# Patient Record
Sex: Female | Born: 1935 | Race: Black or African American | Hispanic: No | Smoking: Former smoker
Health system: Southern US, Community
[De-identification: ages and names within clinical notes are randomized; demographics above are authoritative.]

## PROBLEM LIST (undated history)

## (undated) DIAGNOSIS — K219 Gastro-esophageal reflux disease without esophagitis: Principal | ICD-10-CM

## (undated) DIAGNOSIS — G4700 Insomnia, unspecified: Secondary | ICD-10-CM

## (undated) DIAGNOSIS — H269 Unspecified cataract: Secondary | ICD-10-CM

## (undated) DIAGNOSIS — E039 Hypothyroidism, unspecified: Secondary | ICD-10-CM

## (undated) DIAGNOSIS — I7090 Unspecified atherosclerosis: Secondary | ICD-10-CM

## (undated) DIAGNOSIS — K226 Gastro-esophageal laceration-hemorrhage syndrome: Secondary | ICD-10-CM

## (undated) DIAGNOSIS — E785 Hyperlipidemia, unspecified: Secondary | ICD-10-CM

## (undated) DIAGNOSIS — I5189 Other ill-defined heart diseases: Secondary | ICD-10-CM

## (undated) DIAGNOSIS — E876 Hypokalemia: Secondary | ICD-10-CM

## (undated) DIAGNOSIS — F0391 Unspecified dementia with behavioral disturbance: Secondary | ICD-10-CM

## (undated) DIAGNOSIS — R531 Weakness: Secondary | ICD-10-CM

## (undated) DIAGNOSIS — J45909 Unspecified asthma, uncomplicated: Secondary | ICD-10-CM

## (undated) DIAGNOSIS — I350 Nonrheumatic aortic (valve) stenosis: Secondary | ICD-10-CM

## (undated) DIAGNOSIS — R269 Unspecified abnormalities of gait and mobility: Secondary | ICD-10-CM

## (undated) DIAGNOSIS — Z5189 Encounter for other specified aftercare: Secondary | ICD-10-CM

## (undated) DIAGNOSIS — IMO0001 Reserved for inherently not codable concepts without codable children: Secondary | ICD-10-CM

## (undated) DIAGNOSIS — F03918 Unspecified dementia, unspecified severity, with other behavioral disturbance: Secondary | ICD-10-CM

## (undated) DIAGNOSIS — J449 Chronic obstructive pulmonary disease, unspecified: Secondary | ICD-10-CM

## (undated) DIAGNOSIS — R32 Unspecified urinary incontinence: Secondary | ICD-10-CM

## (undated) DIAGNOSIS — F1021 Alcohol dependence, in remission: Secondary | ICD-10-CM

## (undated) DIAGNOSIS — R911 Solitary pulmonary nodule: Secondary | ICD-10-CM

## (undated) DIAGNOSIS — I10 Essential (primary) hypertension: Secondary | ICD-10-CM

## (undated) DIAGNOSIS — K449 Diaphragmatic hernia without obstruction or gangrene: Secondary | ICD-10-CM

## (undated) DIAGNOSIS — K5900 Constipation, unspecified: Secondary | ICD-10-CM

## (undated) DIAGNOSIS — D649 Anemia, unspecified: Secondary | ICD-10-CM

## (undated) HISTORY — DX: Nonrheumatic aortic (valve) stenosis: I35.0

## (undated) HISTORY — DX: Unspecified asthma, uncomplicated: J45.909

## (undated) HISTORY — DX: Unspecified atherosclerosis: I70.90

## (undated) HISTORY — DX: Other ill-defined heart diseases: I51.89

## (undated) HISTORY — DX: Gastro-esophageal laceration-hemorrhage syndrome: K22.6

## (undated) HISTORY — DX: Unspecified cataract: H26.9

## (undated) HISTORY — DX: Constipation, unspecified: K59.00

## (undated) HISTORY — DX: Solitary pulmonary nodule: R91.1

## (undated) HISTORY — DX: Hyperlipidemia, unspecified: E78.5

## (undated) HISTORY — PX: NO PAST SURGERIES: SHX2092

## (undated) HISTORY — DX: Diaphragmatic hernia without obstruction or gangrene: K44.9

## (undated) HISTORY — DX: Weakness: R53.1

## (undated) HISTORY — DX: Unspecified abnormalities of gait and mobility: R26.9

## (undated) HISTORY — DX: Unspecified urinary incontinence: R32

## (undated) HISTORY — DX: Insomnia, unspecified: G47.00

## (undated) HISTORY — DX: Chronic obstructive pulmonary disease, unspecified: J44.9

## (undated) HISTORY — DX: Hypothyroidism, unspecified: E03.9

---

## 1997-06-29 ENCOUNTER — Inpatient Hospital Stay (HOSPITAL_COMMUNITY): Admission: AD | Admit: 1997-06-29 | Discharge: 1997-06-30 | Payer: Self-pay | Admitting: *Deleted

## 1997-07-15 ENCOUNTER — Inpatient Hospital Stay (HOSPITAL_COMMUNITY): Admission: EM | Admit: 1997-07-15 | Discharge: 1997-07-17 | Payer: Self-pay | Admitting: Emergency Medicine

## 1998-01-04 ENCOUNTER — Inpatient Hospital Stay (HOSPITAL_COMMUNITY): Admission: EM | Admit: 1998-01-04 | Discharge: 1998-01-07 | Payer: Self-pay | Admitting: Critical Care Medicine

## 1998-03-01 ENCOUNTER — Encounter: Payer: Self-pay | Admitting: Emergency Medicine

## 1998-03-02 ENCOUNTER — Inpatient Hospital Stay (HOSPITAL_COMMUNITY): Admission: EM | Admit: 1998-03-02 | Discharge: 1998-03-04 | Payer: Self-pay | Admitting: Emergency Medicine

## 1999-02-24 ENCOUNTER — Inpatient Hospital Stay (HOSPITAL_COMMUNITY): Admission: EM | Admit: 1999-02-24 | Discharge: 1999-02-27 | Payer: Self-pay | Admitting: Emergency Medicine

## 1999-02-24 ENCOUNTER — Encounter: Payer: Self-pay | Admitting: Emergency Medicine

## 1999-09-28 ENCOUNTER — Other Ambulatory Visit: Admission: RE | Admit: 1999-09-28 | Discharge: 1999-09-28 | Payer: Self-pay | Admitting: Internal Medicine

## 1999-10-05 ENCOUNTER — Ambulatory Visit (HOSPITAL_COMMUNITY): Admission: RE | Admit: 1999-10-05 | Discharge: 1999-10-05 | Payer: Self-pay | Admitting: Internal Medicine

## 1999-10-05 ENCOUNTER — Encounter: Payer: Self-pay | Admitting: Internal Medicine

## 1999-10-09 ENCOUNTER — Emergency Department (HOSPITAL_COMMUNITY): Admission: EM | Admit: 1999-10-09 | Discharge: 1999-10-09 | Payer: Self-pay | Admitting: Emergency Medicine

## 1999-10-09 ENCOUNTER — Encounter: Payer: Self-pay | Admitting: Emergency Medicine

## 1999-10-14 ENCOUNTER — Ambulatory Visit (HOSPITAL_COMMUNITY): Admission: RE | Admit: 1999-10-14 | Discharge: 1999-10-14 | Payer: Self-pay | Admitting: Pulmonary Disease

## 1999-10-14 ENCOUNTER — Encounter: Payer: Self-pay | Admitting: Pulmonary Disease

## 1999-10-25 ENCOUNTER — Encounter: Payer: Self-pay | Admitting: Emergency Medicine

## 1999-10-25 ENCOUNTER — Inpatient Hospital Stay (HOSPITAL_COMMUNITY): Admission: EM | Admit: 1999-10-25 | Discharge: 1999-10-30 | Payer: Self-pay | Admitting: Emergency Medicine

## 1999-11-02 ENCOUNTER — Encounter: Payer: Self-pay | Admitting: Gastroenterology

## 1999-11-17 ENCOUNTER — Encounter: Payer: Self-pay | Admitting: Gastroenterology

## 1999-11-17 ENCOUNTER — Ambulatory Visit (HOSPITAL_COMMUNITY): Admission: RE | Admit: 1999-11-17 | Discharge: 1999-11-17 | Payer: Self-pay | Admitting: Gastroenterology

## 2001-03-04 ENCOUNTER — Emergency Department (HOSPITAL_COMMUNITY): Admission: EM | Admit: 2001-03-04 | Discharge: 2001-03-04 | Payer: Self-pay | Admitting: Emergency Medicine

## 2001-03-04 ENCOUNTER — Encounter: Payer: Self-pay | Admitting: Emergency Medicine

## 2001-09-11 ENCOUNTER — Other Ambulatory Visit: Admission: RE | Admit: 2001-09-11 | Discharge: 2001-09-11 | Payer: Self-pay | Admitting: Internal Medicine

## 2001-09-19 ENCOUNTER — Encounter: Payer: Self-pay | Admitting: Internal Medicine

## 2001-09-19 ENCOUNTER — Ambulatory Visit (HOSPITAL_COMMUNITY): Admission: RE | Admit: 2001-09-19 | Discharge: 2001-09-19 | Payer: Self-pay | Admitting: Internal Medicine

## 2001-12-11 ENCOUNTER — Emergency Department (HOSPITAL_COMMUNITY): Admission: EM | Admit: 2001-12-11 | Discharge: 2001-12-12 | Payer: Self-pay | Admitting: Emergency Medicine

## 2001-12-12 ENCOUNTER — Encounter: Payer: Self-pay | Admitting: Emergency Medicine

## 2003-01-07 ENCOUNTER — Emergency Department (HOSPITAL_COMMUNITY): Admission: EM | Admit: 2003-01-07 | Discharge: 2003-01-07 | Payer: Self-pay | Admitting: Emergency Medicine

## 2003-01-07 ENCOUNTER — Ambulatory Visit (HOSPITAL_COMMUNITY): Admission: RE | Admit: 2003-01-07 | Discharge: 2003-01-07 | Payer: Self-pay | Admitting: Rheumatology

## 2004-03-27 ENCOUNTER — Ambulatory Visit: Payer: Self-pay | Admitting: Pulmonary Disease

## 2004-05-26 ENCOUNTER — Ambulatory Visit: Payer: Self-pay | Admitting: Pulmonary Disease

## 2004-06-29 ENCOUNTER — Ambulatory Visit: Payer: Self-pay | Admitting: Pulmonary Disease

## 2004-10-09 ENCOUNTER — Ambulatory Visit: Payer: Self-pay | Admitting: Pulmonary Disease

## 2005-06-29 ENCOUNTER — Encounter: Admission: RE | Admit: 2005-06-29 | Discharge: 2005-06-29 | Payer: Self-pay | Admitting: Internal Medicine

## 2006-03-11 ENCOUNTER — Inpatient Hospital Stay (HOSPITAL_COMMUNITY): Admission: EM | Admit: 2006-03-11 | Discharge: 2006-03-13 | Payer: Self-pay | Admitting: Family Medicine

## 2006-09-05 ENCOUNTER — Encounter: Payer: Self-pay | Admitting: Internal Medicine

## 2006-09-05 ENCOUNTER — Inpatient Hospital Stay (HOSPITAL_COMMUNITY): Admission: EM | Admit: 2006-09-05 | Discharge: 2006-09-07 | Payer: Self-pay | Admitting: Emergency Medicine

## 2006-09-05 DIAGNOSIS — K219 Gastro-esophageal reflux disease without esophagitis: Secondary | ICD-10-CM

## 2006-09-05 DIAGNOSIS — K226 Gastro-esophageal laceration-hemorrhage syndrome: Secondary | ICD-10-CM

## 2006-09-05 DIAGNOSIS — K449 Diaphragmatic hernia without obstruction or gangrene: Secondary | ICD-10-CM

## 2006-09-05 HISTORY — DX: Gastro-esophageal laceration-hemorrhage syndrome: K22.6

## 2006-09-05 HISTORY — DX: Diaphragmatic hernia without obstruction or gangrene: K44.9

## 2006-09-07 ENCOUNTER — Ambulatory Visit: Payer: Self-pay | Admitting: Internal Medicine

## 2006-10-03 ENCOUNTER — Ambulatory Visit: Payer: Self-pay | Admitting: Gastroenterology

## 2006-10-03 LAB — CONVERTED CEMR LAB
Basophils Relative: 0.5 % (ref 0.0–1.0)
Eosinophils Absolute: 0.3 10*3/uL (ref 0.0–0.6)
Eosinophils Relative: 3 % (ref 0.0–5.0)
Hemoglobin: 11.3 g/dL — ABNORMAL LOW (ref 12.0–15.0)
Lymphocytes Relative: 32.3 % (ref 12.0–46.0)
MCV: 86.1 fL (ref 78.0–100.0)
Monocytes Absolute: 0.6 10*3/uL (ref 0.2–0.7)
Neutro Abs: 5.5 10*3/uL (ref 1.4–7.7)
WBC: 9.5 10*3/uL (ref 4.5–10.5)

## 2007-08-01 ENCOUNTER — Encounter: Admission: RE | Admit: 2007-08-01 | Discharge: 2007-08-01 | Payer: Self-pay | Admitting: Internal Medicine

## 2007-11-19 ENCOUNTER — Emergency Department (HOSPITAL_COMMUNITY): Admission: EM | Admit: 2007-11-19 | Discharge: 2007-11-19 | Payer: Self-pay | Admitting: Emergency Medicine

## 2008-03-18 ENCOUNTER — Encounter: Admission: RE | Admit: 2008-03-18 | Discharge: 2008-03-18 | Payer: Self-pay | Admitting: Neurology

## 2008-06-14 ENCOUNTER — Inpatient Hospital Stay (HOSPITAL_COMMUNITY): Admission: EM | Admit: 2008-06-14 | Discharge: 2008-06-19 | Payer: Self-pay | Admitting: Emergency Medicine

## 2008-06-17 ENCOUNTER — Ambulatory Visit: Payer: Self-pay | Admitting: Vascular Surgery

## 2008-06-17 ENCOUNTER — Encounter (INDEPENDENT_AMBULATORY_CARE_PROVIDER_SITE_OTHER): Payer: Self-pay | Admitting: Internal Medicine

## 2009-02-09 ENCOUNTER — Emergency Department (HOSPITAL_COMMUNITY): Admission: EM | Admit: 2009-02-09 | Discharge: 2009-02-09 | Payer: Self-pay | Admitting: Emergency Medicine

## 2009-07-13 ENCOUNTER — Emergency Department (HOSPITAL_COMMUNITY): Admission: EM | Admit: 2009-07-13 | Discharge: 2009-07-13 | Payer: Self-pay | Admitting: Emergency Medicine

## 2009-08-27 ENCOUNTER — Observation Stay (HOSPITAL_COMMUNITY): Admission: EM | Admit: 2009-08-27 | Discharge: 2009-08-30 | Payer: Self-pay | Admitting: Emergency Medicine

## 2009-09-14 ENCOUNTER — Emergency Department (HOSPITAL_COMMUNITY): Admission: EM | Admit: 2009-09-14 | Discharge: 2009-09-14 | Payer: Self-pay | Admitting: Emergency Medicine

## 2010-03-02 ENCOUNTER — Emergency Department (HOSPITAL_COMMUNITY)
Admission: EM | Admit: 2010-03-02 | Discharge: 2010-03-02 | Payer: Self-pay | Source: Home / Self Care | Admitting: Emergency Medicine

## 2010-04-02 ENCOUNTER — Emergency Department (HOSPITAL_COMMUNITY)
Admission: EM | Admit: 2010-04-02 | Discharge: 2010-04-02 | Payer: Self-pay | Source: Home / Self Care | Admitting: Emergency Medicine

## 2010-04-02 LAB — BASIC METABOLIC PANEL
CO2: 28 mEq/L (ref 19–32)
Calcium: 9.9 mg/dL (ref 8.4–10.5)
Creatinine, Ser: 1.02 mg/dL (ref 0.4–1.2)
GFR calc Af Amer: >60 mL/min (ref >60)
GFR calc non Af Amer: 52 mL/min — ABNORMAL LOW (ref >60)
Glucose, Bld: 80 mg/dL (ref 70–99)
Sodium: 140 mEq/L (ref 135–145)

## 2010-04-02 LAB — DIFFERENTIAL
Basophils Absolute: 0 10*3/uL (ref 0.0–0.1)
Basophils Relative: 0 % (ref 0–1)
Eosinophils Relative: 2 % (ref 0–5)
Monocytes Absolute: 0.8 10*3/uL (ref 0.1–1.0)
Monocytes Relative: 11 % (ref 3–12)
Neutro Abs: 3.9 10*3/uL (ref 1.7–7.7)

## 2010-04-02 LAB — URINALYSIS, ROUTINE W REFLEX MICROSCOPIC
Hgb urine dipstick: NEGATIVE
Ketones, ur: NEGATIVE mg/dL
Nitrite: NEGATIVE
Protein, ur: NEGATIVE mg/dL
Specific Gravity, Urine: 1.025 (ref 1.005–1.030)
Urine Glucose, Fasting: NEGATIVE mg/dL
Urobilinogen, UA: 1 mg/dL (ref 0.0–1.0)
pH: 5 (ref 5.0–8.0)

## 2010-04-02 LAB — CBC
Hemoglobin: 12.5 g/dL (ref 12.0–15.0)
MCH: 29.5 pg (ref 26.0–34.0)
MCHC: 32.1 g/dL (ref 30.0–36.0)
RDW: 15.5 % (ref 11.5–15.5)

## 2010-04-02 LAB — URINE MICROSCOPIC-ADD ON

## 2010-04-02 LAB — POCT CARDIAC MARKERS
CKMB, poc: <1 ng/mL — ABNORMAL LOW (ref 1.0–8.0)
Myoglobin, poc: 45.2 ng/mL (ref 12–200)

## 2010-04-03 LAB — URINE CULTURE

## 2010-05-18 LAB — POCT I-STAT, CHEM 8
BUN: 14 mg/dL (ref 6–23)
Calcium, Ion: 0.97 mmol/L — ABNORMAL LOW (ref 1.12–1.32)
Creatinine, Ser: 1.3 mg/dL — ABNORMAL HIGH (ref 0.4–1.2)
Glucose, Bld: 66 mg/dL — ABNORMAL LOW (ref 70–99)
Hemoglobin: 14.6 g/dL (ref 12.0–15.0)
TCO2: 25 mmol/L (ref 0–100)

## 2010-05-18 LAB — RAPID URINE DRUG SCREEN, HOSP PERFORMED
Amphetamines: NOT DETECTED
Opiates: NOT DETECTED
Tetrahydrocannabinol: NOT DETECTED

## 2010-05-24 LAB — COMPREHENSIVE METABOLIC PANEL
AST: 15 U/L (ref 0–37)
Albumin: 3.2 g/dL — ABNORMAL LOW (ref 3.5–5.2)
Alkaline Phosphatase: 68 U/L (ref 39–117)
BUN: 7 mg/dL (ref 6–23)
GFR calc Af Amer: >60 mL/min (ref >60)
Potassium: 3.2 mEq/L — ABNORMAL LOW (ref 3.5–5.1)
Total Protein: 7.3 g/dL (ref 6.0–8.3)

## 2010-05-24 LAB — GLUCOSE, CAPILLARY: Glucose-Capillary: 89 mg/dL (ref 70–99)

## 2010-05-24 LAB — URINALYSIS, ROUTINE W REFLEX MICROSCOPIC
Bilirubin Urine: NEGATIVE
Hgb urine dipstick: NEGATIVE
Specific Gravity, Urine: 1.009 (ref 1.005–1.030)
pH: 5.5 (ref 5.0–8.0)

## 2010-05-24 LAB — CBC
MCV: 90.9 fL (ref 78.0–100.0)
Platelets: 301 10*3/uL (ref 150–400)
RDW: 17 % — ABNORMAL HIGH (ref 11.5–15.5)
WBC: 8.2 10*3/uL (ref 4.0–10.5)

## 2010-05-24 LAB — POCT I-STAT, CHEM 8
BUN: 9 mg/dL (ref 6–23)
Calcium, Ion: 1.18 mmol/L (ref 1.12–1.32)
Chloride: 107 mEq/L (ref 96–112)
Creatinine, Ser: 1 mg/dL (ref 0.4–1.2)
Glucose, Bld: 89 mg/dL (ref 70–99)
HCT: 44 % (ref 36.0–46.0)
Hemoglobin: 15 g/dL (ref 12.0–15.0)
Potassium: 3.2 mEq/L — ABNORMAL LOW (ref 3.5–5.1)
Sodium: 145 meq/L (ref 135–145)
TCO2: 28 mmol/L (ref 0–100)

## 2010-05-24 LAB — POCT CARDIAC MARKERS
CKMB, poc: 1 ng/mL (ref 1.0–8.0)
Myoglobin, poc: 69.7 ng/mL (ref 12–200)
Troponin i, poc: <0.05 ng/mL (ref 0.00–0.09)

## 2010-05-24 LAB — DIFFERENTIAL
Basophils Relative: 1 % (ref 0–1)
Lymphocytes Relative: 34 % (ref 12–46)
Monocytes Absolute: 0.5 10*3/uL (ref 0.1–1.0)
Monocytes Relative: 6 % (ref 3–12)
Neutro Abs: 4.8 10*3/uL (ref 1.7–7.7)

## 2010-05-25 LAB — COMPREHENSIVE METABOLIC PANEL
ALT: 11 U/L (ref 0–35)
AST: 17 U/L (ref 0–37)
CO2: 27 mEq/L (ref 19–32)
Chloride: 103 mEq/L (ref 96–112)
Creatinine, Ser: 0.93 mg/dL (ref 0.4–1.2)
GFR calc Af Amer: >60 mL/min (ref >60)
GFR calc non Af Amer: 58 mL/min — ABNORMAL LOW (ref >60)
Glucose, Bld: 79 mg/dL (ref 70–99)
Sodium: 139 mEq/L (ref 135–145)
Total Bilirubin: 1.4 mg/dL — ABNORMAL HIGH (ref 0.3–1.2)

## 2010-05-25 LAB — CBC
HCT: 39 % (ref 36.0–46.0)
Hemoglobin: 13.4 g/dL (ref 12.0–15.0)
MCH: 30.8 pg (ref 26.0–34.0)
MCHC: 33.7 g/dL (ref 30.0–36.0)
Platelets: 337 10*3/uL (ref 150–400)
RBC: 4.34 MIL/uL (ref 3.87–5.11)
RDW: 17.8 % — ABNORMAL HIGH (ref 11.5–15.5)
WBC: 7.5 10*3/uL (ref 4.0–10.5)

## 2010-05-25 LAB — BASIC METABOLIC PANEL
Calcium: 10.1 mg/dL (ref 8.4–10.5)
Calcium: 9.5 mg/dL (ref 8.4–10.5)
Creatinine, Ser: 1.08 mg/dL (ref 0.4–1.2)
GFR calc Af Amer: >60 mL/min (ref >60)
GFR calc non Af Amer: 49 mL/min — ABNORMAL LOW (ref >60)
GFR calc non Af Amer: 58 mL/min — ABNORMAL LOW (ref >60)
Glucose, Bld: 114 mg/dL — ABNORMAL HIGH (ref 70–99)
Glucose, Bld: 78 mg/dL (ref 70–99)
Sodium: 141 mEq/L (ref 135–145)
Sodium: 143 mEq/L (ref 135–145)

## 2010-05-25 LAB — TSH: TSH: 2.797 u[IU]/mL (ref 0.350–4.500)

## 2010-05-25 LAB — D-DIMER, QUANTITATIVE: D-Dimer, Quant: 1.08 ug/mL-FEU — ABNORMAL HIGH (ref 0.00–0.48)

## 2010-05-25 LAB — GLUCOSE, CAPILLARY
Glucose-Capillary: 103 mg/dL — ABNORMAL HIGH (ref 70–99)
Glucose-Capillary: 105 mg/dL — ABNORMAL HIGH (ref 70–99)
Glucose-Capillary: 83 mg/dL (ref 70–99)
Glucose-Capillary: 91 mg/dL (ref 70–99)
Glucose-Capillary: 98 mg/dL (ref 70–99)

## 2010-05-25 LAB — POCT I-STAT 3, ART BLOOD GAS (G3+)
Acid-Base Excess: 1 mmol/L (ref 0.0–2.0)
Bicarbonate: 25.2 mEq/L — ABNORMAL HIGH (ref 20.0–24.0)
pCO2 arterial: 36.9 mmHg (ref 35.0–45.0)
pH, Arterial: 7.441 — ABNORMAL HIGH (ref 7.350–7.400)
pO2, Arterial: 149 mmHg — ABNORMAL HIGH (ref 80.0–100.0)

## 2010-05-25 LAB — URINALYSIS, ROUTINE W REFLEX MICROSCOPIC
Leukocytes, UA: NEGATIVE
Protein, ur: 30 mg/dL — AB
Urobilinogen, UA: 1 mg/dL (ref 0.0–1.0)

## 2010-05-25 LAB — CARDIAC PANEL(CRET KIN+CKTOT+MB+TROPI): CK, MB: 3.7 ng/mL (ref 0.3–4.0)

## 2010-05-25 LAB — DIFFERENTIAL
Basophils Absolute: 0 10*3/uL (ref 0.0–0.1)
Basophils Absolute: 0 10*3/uL (ref 0.0–0.1)
Basophils Relative: 0 % (ref 0–1)
Basophils Relative: 0 % (ref 0–1)
Eosinophils Absolute: 0.1 10*3/uL (ref 0.0–0.7)
Eosinophils Relative: 1 % (ref 0–5)
Lymphocytes Relative: 16 % (ref 12–46)
Neutro Abs: 5.8 10*3/uL (ref 1.7–7.7)
Neutrophils Relative %: 77 % (ref 43–77)

## 2010-05-25 LAB — T4, FREE: Free T4: 1.12 ng/dL (ref 0.80–1.80)

## 2010-05-25 LAB — URINE MICROSCOPIC-ADD ON

## 2010-05-25 LAB — POCT CARDIAC MARKERS
CKMB, poc: 3.1 ng/mL (ref 1.0–8.0)
Troponin i, poc: <0.05 ng/mL (ref 0.00–0.09)
Troponin i, poc: <0.05 ng/mL (ref 0.00–0.09)

## 2010-05-25 LAB — CK TOTAL AND CKMB (NOT AT ARMC)
Relative Index: 2.6 — ABNORMAL HIGH (ref 0.0–2.5)
Total CK: 136 U/L (ref 7–177)

## 2010-05-25 LAB — SEDIMENTATION RATE: Sed Rate: 15 mm/hr (ref 0–22)

## 2010-05-26 LAB — URINE CULTURE: Colony Count: 50000

## 2010-05-26 LAB — COMPREHENSIVE METABOLIC PANEL
ALT: 16 U/L (ref 0–35)
AST: 22 U/L (ref 0–37)
Albumin: 3.5 g/dL (ref 3.5–5.2)
Alkaline Phosphatase: 67 U/L (ref 39–117)
BUN: 22 mg/dL (ref 6–23)
Chloride: 102 mEq/L (ref 96–112)
Potassium: 3.7 mEq/L (ref 3.5–5.1)
Total Bilirubin: 1.3 mg/dL — ABNORMAL HIGH (ref 0.3–1.2)

## 2010-05-26 LAB — CBC
HCT: 42.2 % (ref 36.0–46.0)
Platelets: 416 10*3/uL — ABNORMAL HIGH (ref 150–400)
WBC: 6.1 10*3/uL (ref 4.0–10.5)

## 2010-05-26 LAB — URINE MICROSCOPIC-ADD ON

## 2010-05-26 LAB — DIFFERENTIAL
Basophils Absolute: 0 10*3/uL (ref 0.0–0.1)
Basophils Relative: 1 % (ref 0–1)
Eosinophils Absolute: 0.1 10*3/uL (ref 0.0–0.7)
Eosinophils Relative: 2 % (ref 0–5)
Monocytes Absolute: 0.5 10*3/uL (ref 0.1–1.0)
Neutro Abs: 3.3 10*3/uL (ref 1.7–7.7)

## 2010-05-26 LAB — URINALYSIS, ROUTINE W REFLEX MICROSCOPIC
Hgb urine dipstick: NEGATIVE
Specific Gravity, Urine: 1.026 (ref 1.005–1.030)
pH: 5 (ref 5.0–8.0)

## 2010-06-09 LAB — CBC
HCT: 41.2 % (ref 36.0–46.0)
MCHC: 34.3 g/dL (ref 30.0–36.0)
MCV: 88.8 fL (ref 78.0–100.0)
Platelets: 211 10*3/uL (ref 150–400)
RBC: 4.64 MIL/uL (ref 3.87–5.11)

## 2010-06-09 LAB — POCT I-STAT, CHEM 8
BUN: 6 mg/dL (ref 6–23)
Calcium, Ion: 1.08 mmol/L — ABNORMAL LOW (ref 1.12–1.32)
Chloride: 95 mEq/L — ABNORMAL LOW (ref 96–112)
Glucose, Bld: 140 mg/dL — ABNORMAL HIGH (ref 70–99)
TCO2: 29 mmol/L (ref 0–100)

## 2010-06-09 LAB — URINALYSIS, ROUTINE W REFLEX MICROSCOPIC
Hgb urine dipstick: NEGATIVE
Leukocytes, UA: NEGATIVE
Nitrite: NEGATIVE
Protein, ur: 30 mg/dL — AB
Specific Gravity, Urine: 1.015 (ref 1.005–1.030)
Urobilinogen, UA: 1 mg/dL (ref 0.0–1.0)

## 2010-06-09 LAB — POCT CARDIAC MARKERS
CKMB, poc: 1.3 ng/mL (ref 1.0–8.0)
Troponin i, poc: <0.05 ng/mL (ref 0.00–0.09)

## 2010-06-09 LAB — URINE CULTURE
Colony Count: NO GROWTH
Culture: NO GROWTH

## 2010-06-09 LAB — DIFFERENTIAL
Basophils Relative: 0 % (ref 0–1)
Eosinophils Absolute: 0 10*3/uL (ref 0.0–0.7)
Eosinophils Relative: 0 % (ref 0–5)
Monocytes Relative: 2 % — ABNORMAL LOW (ref 3–12)
Neutrophils Relative %: 92 % — ABNORMAL HIGH (ref 43–77)

## 2010-06-09 LAB — URINE MICROSCOPIC-ADD ON

## 2010-06-09 LAB — ETHANOL: Alcohol, Ethyl (B): 6 mg/dL (ref 0–10)

## 2010-06-09 LAB — PROTIME-INR: INR: 1.13 (ref 0.00–1.49)

## 2010-06-17 LAB — T4, FREE: Free T4: 0.96 ng/dL (ref 0.80–1.80)

## 2010-06-17 LAB — CBC
HCT: 35.3 % — ABNORMAL LOW (ref 36.0–46.0)
Hemoglobin: 11.6 g/dL — ABNORMAL LOW (ref 12.0–15.0)
Hemoglobin: 11.8 g/dL — ABNORMAL LOW (ref 12.0–15.0)
Hemoglobin: 13.3 g/dL (ref 12.0–15.0)
MCHC: 33.5 g/dL (ref 30.0–36.0)
MCHC: 33.6 g/dL (ref 30.0–36.0)
MCHC: 34.4 g/dL (ref 30.0–36.0)
MCV: 88.3 fL (ref 78.0–100.0)
MCV: 88.9 fL (ref 78.0–100.0)
Platelets: 242 10*3/uL (ref 150–400)
RBC: 3.92 MIL/uL (ref 3.87–5.11)
RBC: 3.96 MIL/uL (ref 3.87–5.11)
RBC: 4.43 MIL/uL (ref 3.87–5.11)
RDW: 15.7 % — ABNORMAL HIGH (ref 11.5–15.5)
WBC: 5.5 10*3/uL (ref 4.0–10.5)
WBC: 5.9 10*3/uL (ref 4.0–10.5)

## 2010-06-17 LAB — POCT I-STAT, CHEM 8
Creatinine, Ser: 1 mg/dL (ref 0.4–1.2)
Glucose, Bld: 105 mg/dL — ABNORMAL HIGH (ref 70–99)
HCT: 42 % (ref 36.0–46.0)
Hemoglobin: 14.3 g/dL (ref 12.0–15.0)
Potassium: 3.3 mEq/L — ABNORMAL LOW (ref 3.5–5.1)
Sodium: 137 mEq/L (ref 135–145)
TCO2: 25 mmol/L (ref 0–100)

## 2010-06-17 LAB — CARDIAC PANEL(CRET KIN+CKTOT+MB+TROPI)
Relative Index: INVALID (ref 0.0–2.5)
Relative Index: INVALID (ref 0.0–2.5)
Total CK: 53 U/L (ref 7–177)
Troponin I: <0.01 ng/mL (ref 0.00–0.06)

## 2010-06-17 LAB — POCT CARDIAC MARKERS
CKMB, poc: <1 ng/mL — ABNORMAL LOW (ref 1.0–8.0)
Myoglobin, poc: 85.4 ng/mL (ref 12–200)

## 2010-06-17 LAB — DIFFERENTIAL
Basophils Absolute: 0.1 10*3/uL (ref 0.0–0.1)
Basophils Relative: 1 % (ref 0–1)
Lymphocytes Relative: 27 % (ref 12–46)
Monocytes Absolute: 0.4 10*3/uL (ref 0.1–1.0)
Monocytes Relative: 8 % (ref 3–12)
Neutro Abs: 3.5 10*3/uL (ref 1.7–7.7)
Neutrophils Relative %: 60 % (ref 43–77)

## 2010-06-17 LAB — COMPREHENSIVE METABOLIC PANEL
BUN: 9 mg/dL (ref 6–23)
CO2: 26 mEq/L (ref 19–32)
Calcium: 9.1 mg/dL (ref 8.4–10.5)
Creatinine, Ser: 0.93 mg/dL (ref 0.4–1.2)
GFR calc Af Amer: >60 mL/min (ref >60)
GFR calc non Af Amer: 59 mL/min — ABNORMAL LOW (ref >60)
Glucose, Bld: 78 mg/dL (ref 70–99)
Total Bilirubin: 1 mg/dL (ref 0.3–1.2)

## 2010-06-17 LAB — IRON AND TIBC
Iron: 23 ug/dL — ABNORMAL LOW (ref 42–135)
TIBC: 246 ug/dL — ABNORMAL LOW (ref 250–470)

## 2010-06-17 LAB — RPR: RPR Ser Ql: NONREACTIVE

## 2010-06-17 LAB — RETICULOCYTES: RBC.: 4.07 MIL/uL (ref 3.87–5.11)

## 2010-06-17 LAB — LIPID PANEL
Cholesterol: 161 mg/dL (ref 0–200)
LDL Cholesterol: 84 mg/dL (ref 0–99)

## 2010-06-17 LAB — HEMOGLOBIN A1C: Mean Plasma Glucose: 108 mg/dL

## 2010-07-21 NOTE — Discharge Summary (Signed)
NAME:  [name], [name] ROYALL NO.:  [id number]   MEDICAL RECORD NO.:  [id number]          PATIENT TYPE:  INP   LOCATION:  5040                         FACILITY:  [facility]   PHYSICIAN:  Altha Harm, MDDATE OF BIRTH:  04-14-1929   DATE OF ADMISSION:  06/14/2008  DATE OF DISCHARGE:                               DISCHARGE SUMMARY   DISCHARGE DISPOSITION:  Home with 24-hour supervision.   DISCHARGE DIAGNOSES:  1. Amnesia, resolved.  2. Dementia.  3. Orthostatic hypotension, resolved.  4. Chest pain, resolved.  5. Hypothyroidism, poorly controlled.  6. History of hypertension.  7. Diastolic dysfunction.  8. Prior tobacco use disorder.  9. Diabetes, diet controlled.  10.Hyperlipidemia.  11.History of excessive alcohol use; the patient denies recent use.   DISCHARGE MEDICATIONS INCLUDE THE FOLLOWING:  1. Prevacid 30 mg p.o. daily.  2. Synthroid 112 mcg daily.  3. Ambien 10 mg p.o. at bedtime p.r.n.  4. Aricept 5 mg p.o. daily.  5. Crestor 5 mg p.o. daily.  6. Diovan 80 mg p.o. daily.  7. Vitamin D 50,000 units p.o. weekly.  8. Advair 500/50 one puff p.o. b.i.d.  9. Vitamin  B1, 100 mg p.o. daily.  10.Folate 1 mg p.o. daily.   CONSULTANTS:  1. Antonietta Breach, M.D., psychiatry.  2. Lyn Records, M.D., cardiology.   PROCEDURES:  None.   DIAGNOSTIC STUDIES:  1. A 2D echocardiogram which shows an ejection fraction of 55-60%.      There are no wall motion abnormalities.  2. CT angiogram of the chest done on admission which shows no evidence      of acute pulmonary embolus.  3. Chest x-ray, 2-view, done on admission which shows no acute      cardiopulmonary abnormality.   ALLERGIES:  PENICILLIN.   CODE STATUS:  Full code.   PRIMARY CARE PHYSICIAN:  [name], M.D.   CHIEF COMPLAINT:  Chest pain and amnesia.   HISTORY OF PRESENT ILLNESS:  Please see the H and P by Dr. Glade Lloyd for  details of the HPI.  However, in short, this is a patient who  presented  to the emergency room with complaints of loss of memory of how she got  there.  The patient was unsure as to whether or not she passed out or  whether she just had an amnestic period.  She did complain of some chest  pain and, based upon this, Dr. Glade Lloyd made an admission.  However, the  patient's chest pain was a miniscule complaint in the general scheme of  things.   HOSPITAL COURSE:  The patient was admitted and ruled out with serial  cardiac enzymes for any cardiac abnormalities.  Dr. Verdis Prime from  cardiology was consulted and saw the patient and felt that the patient  had noncardiac issues occurring.  In light of the patient's loss of time  and not knowing whether the patient had a period of passing out or of  just an amnestic period, the patient had a workup for syncope which all  proved to be negative.  The patient also underwent a cognitive  evaluation both by speech therapy and a psychiatric evaluation by  psychiatry.  Dr. Jeanie Sewer felt that the patient had dementia and she  was unsafe to be by herself and required 24/7 supervision.  I have  spoken with the patient's daughter, [name], about it and she  is in the process of arranging supervision for the patient.  In addition  to this, the patient did complain of dizziness during her  hospitalization and was found to be orthostatic.  The patient had been  on Diovan/hydrochlorothiazide and the hydrochlorothiazide was held and  the patient given volume replacement.  This abated her dizziness and the  patient was no longer orthostatic.  Her other medications were  continued.  The patient has an RPR, thiamine and folate levels pending.  However, the patient is on thiamine prehospitalization and I have added  Folate to her regimen.  This brings the patient up to date to her  current hospital course.  I expect the patient will be discharged within  24-48 hours as soon as arrangements have been made by her  daughter for  her safe discharge.      Altha Harm, MD  Electronically Signed     MAM/MEDQ  D:  06/18/2008  T:  06/18/2008  Job:  [id number]   cc:   [name]. Allyne [name], M.D.

## 2010-07-21 NOTE — H&P (Signed)
NAME:  [name], [name] NO.:  [id number]   MEDICAL RECORD NO.:  [id number]          PATIENT TYPE:  INP   LOCATION:  1844                         FACILITY:  [facility]   PHYSICIAN:  Theodosia Paling, MD    DATE OF BIRTH:  Dec 18, 1929   DATE OF ADMISSION:  06/14/2008  DATE OF DISCHARGE:                              HISTORY & PHYSICAL   PRIMARY CARE PHYSICIAN:  [name], M.D.   CHIEF COMPLAINT:  Chest pain.   HISTORY OF PRESENT ILLNESS:  Chest pain.   HISTORY OF PRESENT ILLNESS:  [name] is a very pleasant 77-  year-old lady with a past medical history of rheumatoid arthritis,  hypertension, questionable diet-controlled diabetes, hyperlipidemia and  hypothyroidism.  She was in her usual state of health until this morning  when she fell as she was coming from her bathroom to the bedroom and she  felt all of a sudden dizzy.  She fell down and at the same time she was  experiencing severe chest pain 8 to 10/10 in severity with associated  nausea and with associated sweating.  She brought herself to the  emergency room.  She also received sublingual nitroglycerin tablet in  the ER, which decreased her chest pain significantly.  [facility] service was contacted for further evaluation and management.  Her first set of the cardiac enzyme was negative.  EKG in the ER was  showing ST depression II, III, aVF and V1 through V4.   REVIEW OF SYSTEMS:  Negative except for what is mentioned in HPI.   PAST MEDICAL HISTORY:  1. Rheumatoid arthritis  2. Hypertension.  3. Prior history of tobacco use, currently not using tobacco.  4. Hypothyroidism.  5. Diet-controlled diabetes.  6. Hyperlipidemia.   MEDICATIONS:  The patient does not remember her home medications.  However, I called the pharmacy and collected following medications for  her according to them, according to [facility] at [phone].  It  is:  1. Prevacid 30 mg p.o. daily.  2.  Levothyroxine 100 mcg p.o. daily.  3. Ambien 10 mg p.o. daily.  4. Aricept 5 mg p.o. daily.  5. Crestor 5 mg daily.  6. Diovan/hydrochlorothiazide 80/12.5 mg p.o. daily.   ALLERGIES:  The patient is apparently allergic to PENICILLIN.   SOCIAL HISTORY:  The patient occasionally drinks alcohol.  Lives in  [place] independently.  Does not smoke tobacco.   FAMILY HISTORY:  Noncontributory at this time.   VITAL SIGNS:  Afebrile.  Oxygen saturation 98% at room air.  Respiratory  rate 16 to 18.  Heart rate 82.  GENERAL EXAMINATION:  No acute cardiorespiratory distress.  HEENT:  Pupils equally reactive to light.  Moist mucous membrane.  LUNGS:  Normal breath sounds.  There are no rhonchi or crepitations.  CARDIOVASCULAR:  S1, S2 normal.  No murmur, rub or gallop heard.  GI: Soft, nontender.  No organomegaly.  PSYCH:  Oriented x3.  CNS:  Motor, sensory intact.  Follows commands.  Speech intact.   LABORATORY DATA:  Ionized calcium 1.00.  WBC 5.9, hemoglobin 13.3,  hematocrit 38.6, platelet  count 275.  D-dimer 1.2.  Sodium 137,  potassium 3.3, chloride 105, glucose 105, creatinine 1.0.  First set of  cardiac markers negative.  CT scan:  CT angio of the chest ruled out any  PE; however, there is a 5-6-mm right upper lobe pulmonary  nodule,recommend followup in 6 months' time.  Chest x-ray:  No acute  cardiopulmonary process.   ASSESSMENT AND PLAN:  1. Chest pain.  The patient will be admitted under telemetry.  I have      consulted Dr. Katrinka Blazing from [facility] given her EKG changes.      Cardiac enzymes will be cycled.  I have started the patient on      aspirin, added low-dose Coreg, continued Diovan, currently holding      her HCTZ at this time.  Echocardiogram is requested to look for      wall motion defect.  The patient does have certain heart risk      factors for cardiovascular pathology, p.r.n. nitroglycerin,      aspirin, Diovan, Coreg is on board.  2. Lung mass.  The  patient has a very small pulmonary nodule.  Given      her tobacco abuse history, it would be wise to follow up the lung      mass in 6 months' time with primary care physician.  3. Hypertension.  Continue Coreg and Diovan.  I added Coreg, continue      Diovan at this time.  4. Hyperlipidemia.  Continue Crestor.  A morning fasting lipid profile      is requested.  5. Hypothyroidism.  An a.m. TSH and free T4 is requested.  Continue      Synthroid replacement.  6. Prophylaxis DVT, GI on board.  7. Code status.  The patient is full code.   TOTAL TIME SPENT IN THE ADMISSION OF THIS PATIENT:  Around 1 hour.      Theodosia Paling, MD  Electronically Signed     Theodosia Paling, MD  Electronically Signed    NP/MEDQ  D:  06/14/2008  T:  06/14/2008  Job:  [id number]   cc:   Candyce Churn. Allyne [name], M.D.

## 2010-07-21 NOTE — H&P (Signed)
NAME:  [name], [name]              ACCOUNT NO.:  [id number]   MEDICAL RECORD NO.:  [id number]          PATIENT TYPE:  INP   LOCATION:  1845                         FACILITY:  [facility]   PHYSICIAN:  Lonia Blood, M.D.DATE OF BIRTH:  1929/05/23   DATE OF ADMISSION:  09/05/2006  DATE OF DISCHARGE:                              HISTORY & PHYSICAL   PRIMARY CARE PHYSICIAN:  Robyn N. Allyne Gee, M.D.   CHIEF COMPLAINT:  Upper GI bleed.   HISTORY OF PRESENT ILLNESS:  [name] is a very pleasant, 77-  year-old female who was in her usual state of health until this  afternoon.  She began to feel weak and somewhat lightheaded.  This was  primarily when changing from a sitting to a standing position.  No other  symptoms developed during the day.  She specifically denies any  abdominal pain.  Around 10 p.m. tonight, she suddenly became severely  nauseated.  This suddenly became associated with projectile emesis of  bloody material.  She then proceeded to vomit blood an enumerable number  of times at home.  This was always high volume, projectile type  vomiting.  EMS was called.  Upon arrival, EMS indeed reported that there  was blood spewed everywhere.  Blood was noted to be on the wall, on  the carpet, on the bed and all through the bathroom.  The patient's  systolic blood pressure was not able to be obtained when they first  arrived on the scene.  The patient was placed into the ambulance and  brought emergently to the emergency room.  In the ER, systolic blood  pressure of 72 was noted.  The patient remained consciousness  throughout.  The patient has been aggressively volume resuscitated.  Hemoglobin has been found to be 11 at the present time.  GI was  initially called and suggested the patient required medical  stabilization prior to GI evaluation.   REVIEW OF SYSTEMS:  [name] states that she has been perfectly  healthy and in her usual state of health up until the events  of today.  She denies any other complaints with exception of the positive elements  noted in history of present illness.  Complete review of systems is  unremarkable.   PAST MEDICAL HISTORY:  1. History of rheumatoid arthritis treated with over-the-counter      Tylenol.  2. Hypertension.  3. Prior history of tobacco abuse, discontinued 15 years ago.  4. History of severe vocal cord dysfunction.  5. History of chronic asthmatic bronchitis.  6. Gastroesophageal reflux disease.  7. Diet-controlled diabetes mellitus.  The patient reports she is      not presently following a diabetic diet.  8. Hyperlipidemia.   MEDICATIONS:  1. Crestor.  2. Diovan.  3. Levothyroxine, dosages unclear.   ALLERGIES:  PENICILLIN.   SOCIAL HISTORY:  Frequent alcohol intake is reported.  The patient lives  in [place].  She does not presently smoke.   FAMILY HISTORY:  Noncontributory secondary to age.   LABORATORY DATA AND X-RAY FINDINGS:  Hemoglobin 11, white count elevated  at 12.8, platelet  count normal.  LFTs are all normal.  Albumin is  borderline low at 3.1.  Electrolytes are all balanced.  BUN is elevated  at 28, creatinine elevated at 1.4, serum glucose elevated at 134.   PHYSICAL EXAMINATION:  VITAL SIGNS:  Temperature 96.9, blood pressure  72/39 transitioning to 100/46 after volume resuscitation, heart rate 74,  respirations 19, O2 saturations 93% on 3 L per minute nasal cannula.  GENERAL:  Well-developed, well-nourished female who is literally covered  in encrusted blood from head to toe.  HEENT:  Normocephalic, atraumatic.  Sclerae nonicteric.  NECK:  No JVD.  LUNGS:  Clear to auscultation bilaterally without wheezes or rhonchi.  CARDIAC:  Regular rate and rhythm without murmurs, rubs or gallops.  Normal S1, S2.  ABDOMEN:  Nontender, nondistended, soft, bowel sounds present.  No  hepatosplenomegaly, no rebound or ascites.  EXTREMITIES:  No significant clubbing, cyanosis or edema of  bilateral  lower extremities.  NEUROLOGIC:  Alert and oriented x4.  Cranial nerves 2-12 grossly intact  bilaterally with 5/5 strength in bilateral upper and lower extremities.  Intact sensation to touch throughout.   IMPRESSION/PLAN:  1. Severe upper gastrointestinal bleed.  The patient vehemently denies      use of nonsteroidal anti-inflammatory drugs.  Her LFTs are in fact      normal.  We do not have coagulations.  I will check the      coagulations.  Gastroenterology will evaluate the patient.  The      patient will clearly need upper gastrointestinal evaluation.  2. Hypovolemic shock.  The patient was initially suffering with      hypovolemic shock.  I will place her in intensive care unit so that      we can monitor her closely due to the fact that she would have a      high propensity for return to a shock state.  We will continue to      aggressively hydrate her with normal saline fluid and we will need      to follow her CBC closely.  3. Acute blood loss anemia.  The patient's hemoglobin is presently 11.      She does clearly, however, remain volume depleted.  We will go      ahead and type and cross and hold 2 units of blood for her as she      will most certainly require transfusion as her volume resuscitation      is ongoing.      Lonia Blood, M.D.  Electronically Signed     JTM/MEDQ  D:  09/05/2006  T:  09/05/2006  Job:  [id number]   cc:   Candyce Churn. Allyne Gee, M.D.

## 2010-07-21 NOTE — Discharge Summary (Signed)
NAME:  Woodrick, Genia              ACCOUNT NO.:  [id number]   MEDICAL RECORD NO.:  [id number]          PATIENT TYPE:  INP   LOCATION:  2018                         FACILITY:  [facility]   PHYSICIAN:  Madaline Savage, MD        DATE OF BIRTH:  12-06-1929   DATE OF ADMISSION:  09/05/2006  DATE OF DISCHARGE:                               DISCHARGE SUMMARY   ADDENDUM:  Today, Ms. Belloso is stable.  Her hemoglobin is 10.6, which  has gone up from 10 yesterday.  She has not had any more episodes of  hematemesis and denies any other complaints.  She is being discharged  home in stable condition.  She is asked to follow up with her primary  care doctor, Dr. Allyne Gee, in 1 week, and she is also asked to follow up  with gastroenterology, Claudette Head, at [facility] on [facility].  She  was given the phone number, [phone].  She has an appointment on September 28, 2006 at 1:45 p.m.   FINAL DISCHARGE MEDICATIONS:  1. Synthroid 100 mcg daily.  2. Zestril 5 mg daily.  3. Advair Diskus 500/50 twice daily.  4. Prevacid 30 mg twice daily.  5. Diovan 80 mg daily.      Madaline Savage, MD  Electronically Signed     PKN/MEDQ  D:  09/07/2006  T:  09/07/2006  Job:  [id number]

## 2010-07-21 NOTE — Consult Note (Signed)
NAME:  [name], [name] NO.:  [id number]   MEDICAL RECORD NO.:  [id number]          PATIENT TYPE:  INP   LOCATION:  3705                         FACILITY:  [facility]   PHYSICIAN:  Lyn Records, M.D.   DATE OF BIRTH:  26-Dec-1929   DATE OF CONSULTATION:  06/14/2008  DATE OF DISCHARGE:                                 CONSULTATION   REASON FOR EVALUATION:  Vague complaints and concern about the patient's  EKG.   CONCLUSIONS:  1. Chest discomfort, poorly characterized by the patient without good      points of reference due to severe memory loss.  2. Severe memory loss of uncertain cause.  3. Diabetes mellitus.  4. Hypothyroidism.  5. Hyperlipidemia.  6. History of hypertension.   RECOMMENDATIONS:  1. Aspirin one per day.  2. Nitroglycerin if recurrent chest pain.  3. Serial cardiac markers and isoenzymes to rule out myocardial      infarction.  4. A 2-D echo to assess cardiac structural integrity.  5. We would defer ischemic workup until evaluation of the patient's      mental status and some clarification on her overall prognosis.  6. Consider neurology consultation.   COMMENTS:  The patient is 48 and cannot remember how she got to the  hospital or why she is here.  She vaguely states that at some point  during the day, she has had chest pain and was given nitroglycerin,  perhaps in the emergency room.  She describes feeling very poorly.  She  states that sometimes her arm feels weak.  There are other times in the  left parasternal region where it feels as though there is a spreading  out occurring in the left parasternal region.  She denies diaphoresis.  She has chronic dyspnea due to asthma.  She has not noticed lower  extremity swelling.  She denies syncope.  Her medical problems are  enumerated above.   ALLERGIES:  PENICILLIN.   MEDICATIONS ON ADMISSION:  1. Synthroid 100 mcg per day.  2. Ambien as needed.  3. Aricept 5 mg per day.  4. Crestor 5 mg  per day.  5. Diovan and hydrochlorothiazide 80/12.5 mg per day.  6. Prevacid 30 mg per day.   HABITS:  She is a former smoker but has not smoked for 30 years.  She  has rare alcohol intake.   SOCIAL HISTORY:  She lives in a senior home environment.  She lives  alone.  She makes her own bed, does her own cooking, and does her own  shopping according to her.   FAMILY HISTORY:  Noncontributory other than that her mother did die of  myocardial infarction in her late 71s.  Her father died of prostate  cancer.   PHYSICAL EXAMINATION:  CARDIAC:  A 1-2/6 systolic murmur in the left  parasternal region.  There is no jugular vein distention.  LUNGS:  Clear with the exception of faint inspiratory wheezes.  VITAL SIGNS:  The blood pressure is 136/75, heart rate 95, and the  rhythm is irregular.   IMAGING:  The patient's EKG demonstrates  normal sinus rhythm with atrial  bigeminy.   LABORATORY DATA:  Potassium 3.3, creatinine of 1.0, and sodium of 137.  Hemoglobin of 13.3.  A chest CT revealed ectatic aorta, possible mural  thrombi, and soft plaque noted.  A right upper lobe pulmonary nodule was  noted.  D-dimer was 1.02.  The CK-MB and troponin were initially normal.   DISCUSSION:  It is difficult to know exactly what to do in this  situation.  For the time being, I would assume that coronary disease  could be present and start an aspirin, sublingual nitroglycerin as  needed for episodes of pain, get serial markers, and try to do an EKG  during an episode of pain.  I am not sure it is very prudent to do a  nucleus study/stress test until we have further guidance about the cause  of her memory loss and some sense of what her overall prognosis is.  Certainly if she has recurring episodes of chest pain that are  concerning for the possibility of CAD and progressive angina, a workup  of some sort will be done.      Lyn Records, M.D.  Electronically Signed     HWS/MEDQ  D:   06/14/2008  T:  06/16/2008  Job:  [id number]   cc:   Candyce Churn. Allyne Gee, M.D.

## 2010-07-21 NOTE — Consult Note (Signed)
NAME:  Tobey, Annaliese              ACCOUNT NO.:  [id number]   MEDICAL RECORD NO.:  [id number]          PATIENT TYPE:  INP   LOCATION:  5040                         FACILITY:  [facility]   PHYSICIAN:  Antonietta Breach, M.D.  DATE OF BIRTH:  09-03-1929   DATE OF CONSULTATION:  06/18/2008  DATE OF DISCHARGE:                                 CONSULTATION   REQUESTING PHYSICIAN:  [facility].   REASON FOR CONSULTATION:  Acute mental status changes, amnestic events,  report by family of odd behavior.   HISTORY OF PRESENT ILLNESS:  Mrs. Butrick is a 77 year old female  admitted to the [facility] on June 14, 2008, due to chest pain.   The daughter has reported that Mrs. Guyton has been displaying  forgetfulness and some odd behavior.  She does have a history of  drinking alcohol regularly; however, she has not been drinking within  the past month.  Her interests are normal.  She has constructive future  goals.  She does not have any thoughts of harming herself or others.  However, she has had approximately 3 weeks of reduction in energy.  She  has no hallucinations or delusions.   She has been displaying forgetfulness.  This does appear to have a  waxing and waning course.  Speech therapy visited the room yesterday and  reported normal memory function.  However, Mrs. Boeh displayed  significant memory difficulty during the undersigned's exam today.   She has been tried on Aricept 5 mg daily.  She is not agitated or  combative.  She is cooperative and redirectable.  She describes  interests in music and she describes fond memories of her remote time in  [place].   PAST PSYCHIATRIC HISTORY:  Mrs. Friesen denies any history of psychiatric  symptoms or treatment.  However, she does have Aricept mentioned.   In review of the past medical record in July 2008, alcohol abuse was  mentioned in the problem list.   During a consultation report on June 14, 2008, severe memory loss of  uncertain cause was noted by Dr. Katrinka Blazing.   FAMILY PSYCHIATRIC HISTORY:  None known.   SOCIAL HISTORY:  Mrs. Abreu was born in [place], [place].  However, she did move to [place] and lived there for 4 years.  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  She has  four children, one daughter remains in [place].  Her husband has passed  away.  Mrs. Noy does acknowledge a history of significant alcohol  use.  However, she is vague in her history and is not an adequate  historian for her recent months.   PAST MEDICAL HISTORY:  1. Rheumatoid arthritis.  2. Hypertension.  3. Hypothyroidism.  4. Diet-controlled diabetes.  5. Hyperlipidemia.   MEDICATIONS:  The MAR is reviewed.   ALLERGIES:  SHE IS ALLERGIC TO PENICILLIN.   She is on Aricept 5 mg nightly.  Ativan 1 mg was given yesterday.  She  does have Ambien ordered 5 mg nightly p.r.n. insomnia.   LABORATORY DATA:  Hemoglobin A1c normal.  WBC 5.5, hemoglobin  11.6,  platelet count 242.  Her T4 was normal on June 15, 2008.  She is on  Synthroid replacement.   Sodium 138, BUN 9, creatinine 0.93, glucose 78, SGOT is 13, SGPT 10.   MR of the brain without contrast on June 15, 2008, showed no acute  intracranial abnormality or significant interval change.  There was  stable atrophy and white matter disease.   REVIEW OF SYSTEMS:  Mrs. Dicola can help with some of this; however, the  rest is gleaned from the staff, the medical record and the past medical  record.   Constitutional, head, eyes, ears, nose, throat, mouth, neurologic,  psychiatric, cardiovascular, respiratory, gastrointestinal,  genitourinary, skin, musculoskeletal, hematologic, lymphatic, endocrine  and metabolic all unremarkable.   EXAMINATION:  VITAL SIGNS:  Temperature 98.0, pulse 88, respiratory rate  18, blood pressure 160/90.  O2 saturation on room air 96%.  GENERAL APPEARANCE:  Mrs. Fowers is an elderly female lying in a  left  lateral decubitus position in her hospital bed with no abnormal  involuntary movements.   MENTAL STATUS EXAM:  Mrs. Baglio is alert.  Her eye contact is good.  Her affect is mildly flat.  Her mood is slightly anxious.  Concentration  is decreased.  On orientation questioning, she does not know the day of  the month or the day of the week.  On memory testing, she names 3/3  immediate, but 0/3 on recall.  Her fund of knowledge and intelligence  are mildly below that of her estimated premorbid baseline.  Her speech  involves normal rate and prosody.  No dysarthria.  Thought process is  coherent.  Thought content; no thoughts of harming herself or others.  No delusions or hallucinations.  Her insight is poor.  Her judgment is  impaired.  She does not have the ability to appreciate her risk of self-  neglect given her current mental status deficits.   ASSESSMENT:  Axis I:  293.00, delirium, not otherwise specified versus  dementia, not otherwise specified.  By other personnel report on the  healthcare team, her pattern of cognitive and memory impairment may be  waxing and waning, but not include all the classic features of delirium.  History of alcohol abuse.  Axis II:  None.  Axis III:  See past medical history.  Axis IV:  General medical.  Axis V:  Global Assessment of Functioning 40.   Mrs. Gens does have significant impairments currently in memory, as  well as the ability to appreciate her risks.  She also has impairment in  judgment.   At this time, she does not demonstrate the capacity to choose her  environment after discharge.   RECOMMENDATIONS:  1. Would check RPR, folic acid and B12.  2. Given her history of alcohol abuse, including documentations in the      past medical record would start thiamine 100 mg daily to cover the      possibility of nutritional deficiency as a factor in her cognitive      and memory status.  3. Concur with the Aricept trial and would  monitor for any cholinergic      gastrointestinal side effects such as loose stools.  4. Would consider Namenda.  5. Regarding her current mental status, if her deficits do not recover      she will require 24-hour per day supervision to prevent potential      lethal self-neglect.  6. Currently, the decapacitating diagnosis would be dementia, not  otherwise specified.      Antonietta Breach, M.D.  Electronically Signed     JW/MEDQ  D:  06/18/2008  T:  06/18/2008  Job:  [id number]

## 2010-07-21 NOTE — Assessment & Plan Note (Signed)
[facility]                         GASTROENTEROLOGY OFFICE NOTE   AMALIA, EDGECOMBE                     MRN:          [id number]  DATE:10/03/2006                            DOB:          02-27-1930    Ms. Demasi returns following hospitalization for an upper  gastrointestinal bleed secondary to a Mallory-Weiss tear.  Please see  the Discharge Summary and Endoscopy Report included on the office chart.  She has had no further episodes of hematemesis and has had no  gastrointestinal complaints since discharge.  Her discharge hemoglobin  was 10.6.  She does note feeling somewhat fatigued since discharge.  She  has no melena, hematochezia, change in stool caliber, change in bowel  habits, constipation or diarrhea.  There is no family history of colon  cancer, colon polyps or inflammatory bowel disease.   CURRENT MEDICATIONS:  Listed on the chart, updated and reviewed.   MEDICATION ALLERGIES:  PENICILLIN.   PHYSICAL EXAMINATION:  A well-developed, well-nourished African-American  female in no acute distress.  Height 5 feet, 5-1/2 inches, weight 169.6  pounds, blood pressure is 130/82, pulse 76 and regular.  HEENT:  Anicteric sclerae.  Oropharynx clear.  CHEST:  Clear to auscultation bilaterally.  CARDIAC:  Regular rate and rhythm without murmurs appreciated.  ABDOMEN:  Soft, nontender, nondistended, normal active bowel sounds, no  palpable organomegaly, masses or hernias.  RECTAL EXAMINATION:  Deferred to time of colonoscopy.   ASSESSMENT AND PLAN:  1. Status post hospitalization for a Mallory-Weiss tear with post-      hemorrhagic anemia.  She still has mild symptoms of fatigue.  Will      obtain a CBC today.  I have advised her to followup with Dr. Dorothyann Peng for further management and followup of her anemia and other      medical problems.  2. Colorectal cancer screening.  She is at average risk for colon      cancer.  She would like  to proceed with colonoscopy.  Risks,      benefits and alternatives to colonoscopy with possible biopsy and      possible polypectomy discussed with the patient, she consents to      proceed.  This will be scheduled electively.     Venita Lick. Russella Dar, MD, [facility]  Electronically Signed    MTS/MedQ  DD: 10/03/2006  DT: 10/04/2006  Job #: [id number]   cc:   Candyce Churn. Allyne Gee, M.D.

## 2010-07-21 NOTE — Discharge Summary (Signed)
NAME:  [name], [name]              ACCOUNT NO.:  [id number]   MEDICAL RECORD NO.:  [id number]          PATIENT TYPE:  INP   LOCATION:  2904                         FACILITY:  [facility]   PHYSICIAN:  Hind I Elsaid, MD      DATE OF BIRTH:  09-Apr-1929   DATE OF ADMISSION:  09/05/2006  DATE OF DISCHARGE:                               DISCHARGE SUMMARY   INTERIM DISCHARGE SUMMARY:   FINAL DIAGNOSIS:  1. Upper gastrointestinal bleeding secondary to Mallory-Weiss      esophageal tear.  2. Esophageal stricture with no evidence of dysphagia, and no      dilatation was done.  3. Esophagitis.  4. Anemia secondary to #1 and 2.  5. Alcohol abuse.  6. Hypothyroidism.  7. Hyperlipidemia.  8. Hypertension.  9. History of severe vocal cord dysfunction.  10.History of chronic asthmatic bronchitis.   DISCHARGE MEDICATIONS:  1. Protonix 40 mg p.o. b.i.d.  2. Crestor.  3. Synthroid, we will call the pharmacy to get the dose.   CONSULTATION:  Gastroenterology consulted for upper GI bleeding.   PROCEDURE:  1. Endoscopy done on September 05, 2006, with the results showing Mallory-      Weiss tear without active bleeding.  2. Esophagitis.  3. Stricture.   HISTORY OF PRESENT ILLNESS:  You can review the history presented done  by Dr. Sharon Seller.  1. The patient presented with hematemesis, was hypertensive when she      was presented.  2. Upper GI bleeding:  The patient was hemodynamically unstable.  The      patient was admitted to the ICU.  IV fluids were started.  Type and      screen was done, and the patient received 2 units of blood      transfusion.  Hemoglobin remained about 10.  Gastroenterology was      consulted.  EGD was done, which showed the above result.      Gastroenterology recommended Protonix b.i.d. and to stop alcohol.  3. Hypovolemic shock secondary to #1:  The patient stabilized today.      Blood pressure 173/45.  We will monitor the patient for 24 hours.  4. Hypothyroidism:  We  will start the patient on her Synthroid dose at      home.  5. Hyperlipidemia:  Continue her medications.   DISPOSITION:  Possibly, the patient will be discharged in the a.m. if  hemoglobin remains stable and blood pressure remains stable with  Protonix.  Further evaluation of esophageal stricture by  gastroenterologist as an outpatient.      Hind Bosie Helper, MD  Electronically Signed     HIE/MEDQ  D:  09/06/2006  T:  09/06/2006  Job:  [id number]

## 2010-07-24 NOTE — Discharge Summary (Signed)
[place]  Patient:    [name], [name]                       MRN: [id number] Adm. Date:  [id number] Disc. Date: [id number] Attending:  Merwyn Katos                           Discharge Summary  DATE OF BIRTH:  2029-09-27  ADMISSION DIAGNOSES: 1. Acute exacerbation of chronic asthmatic bronchitis. 2. Severe gastroesophageal reflux disease. 3. Vocal dysfunction syndrome. 4. Chronic sinusitis.  DISCHARGE DIAGNOSES: 1. Acute exacerbation of chronic asthmatic bronchitis, much improved. 2. Presumed gastroesophageal reflux disease, currently undergoing evaluation    by Venita Lick. Pleas Koch., M.D. 3. Vocal cord dysfunction syndrome, inactive during this hospitalization. 4. Chronic sinusitis.  HISTORY OF PRESENT ILLNESS:  Please refer to the admission history and physical for this patients initial presentation.  Briefly, she is a 77 year old African-American woman with difficult to control airways disease. She was admitted on October 25, 1999, with increasing symptoms of several days. She was awakened on the morning of admission with severe shortness of breath and came to the emergency department.  At that time, she was found to be in mild to moderate respiratory distress and was noted to be hypercarbic and mildly hypoxemic.  HOSPITAL COURSE:  The patient was found to have wheezes on examination consistent with asthmatic bronchitis and was treated as such in the usual fashion with intravenous corticosteroids, antibiotics, and frequently nebulized bronchodilators.  In addition, she was started on Singulair 10 mg a day and Zyrtec at 10 mg a day.  For presumed chronic sinusitis, she was treated with a nasal decongestant and nasal steroids.  It was presumed that the antibiotics used for asthmatic bronchitis would suffice for sinusitis as well.  Lastly, she underwent evaluation by Venita Lick. Pleas Koch., M.D., for presumed gastroesophageal reflux disease.   She has many symptoms suggestive of this condition.  However, she underwent endoscopy on October 27, 1999, and this was really fairly normal with a normal-appearing esophagus.  She was noted to have a small sliding hiatal hernia.  With the above therapy, she responded well and returned to nearly her baseline by the time of discharge.  She is discharged now in much improved condition.  DISCHARGE MEDICATIONS: 1. Prevacid 30 mg q.12h. 2. Prednisone taper 60 mg to off over night days. 3. Albuterol 2.5 mg nebulized four times a day. 4. Atrovent 0.5 mg nebulized four times a day. 5. Tequin 400 mg q.d. x 5 days. 6. Zyrtec 10 mg p.o. q.h.s. 7. Singulair 10 mg p.o. q.d.  Reglan was discontinued at the time of discharge.  FOLLOW-UP:  She is scheduled for a 24-hour pH probe study on Monday, November 02, 1999, at 10:30 a.m.  Venita Lick. Pleas Koch., M.D., has provided extensive instructions regarding this study.  She is to schedule the follow-up with Dr. Russella Dar on November 17, 1999.  She will call my office for a follow-up in two to three weeks. DD:  10/29/99 TD:  10/31/99 Job: [id number]

## 2010-07-24 NOTE — Discharge Summary (Signed)
NAME:  RENDERLivy, [name] NO.:  [id number]   MEDICAL RECORD NO.:  [id number]          PATIENT TYPE:  INP   LOCATION:  6743                         FACILITY:  [facility]   PHYSICIAN:  Michaelyn Barter, M.D. DATE OF BIRTH:  06-27-1929   DATE OF ADMISSION:  03/11/2006  DATE OF DISCHARGE:  03/13/2006                               DISCHARGE SUMMARY   FINAL DIAGNOSES:  1. Gastroenteritis.  2. Urinary tract infection.  3. Nausea, vomiting, diarrhea, most likely secondary to      gastroenteritis.  4. Dehydration.  5. Hypokalemia.  6. Elevated TSH level.   PROCEDURES:  1. Chest x-ray completed March 11, 2006.  2. Abdominal x-ray completed March 11, 2006.   HISTORY OF PRESENT ILLNESS:  [name] is 77 year old female who  arrived complaining of nausea, vomiting, and diarrhea for 5 days.  She  had been seen by primary care physician and referred to the emergency  department for further evaluation.   PAST MEDICAL HISTORY:  Please see that dictated by Dr. Della Goo.   HOSPITAL COURSE:  1. Gastroenteritis.  The patient's symptomatology appeared to have      coincided with an acute gastroenteritis.  The patient's symptoms      improved very quickly after her admission into the hospital.  By      the following day of her admission she indicated that she felt      significantly better and indicated that she especially good to go      home; however, she had not been initiated back on her diet at that      particular time, therefore, she was kept for an additional 24      hours, her diet was restarted, and over the last 24 hours of her      hospitalization she has not had any nausea, vomiting, or diarrhea,      therefore, her symptoms appear to have improved significantly.  2. Urinary tract infection.  A urinalysis was completed.  It confirmed      the presence of a UTI.  The patient was started on an IV      ciprofloxacin.  3. Dehydration.  This was more than likely  the result of the      repetitive episodes of vomiting as well as the diarrhea that the      patient experienced.  She has received aggressive IV hydration over      the course of her hospitalization and this too has resolved.  4. Hypokalemia.  The patient's potassium level was noted to have been      3.1 following her admission into the hospital.  She has received      subsequent supplementation for that.  In addition the patient also      undergone a chest x-ray on March 11, 2006, which revealed slight      hyperaeration.  No active lung disease was identified.  An      abdominal x-ray was completed on January 4th, which revealed no      obstruction or free air.  5. Elevated TSH.  The patient's TSH level was noted to have been      15.515, indicating the patient may have hypothyroidism, which would      be a new diagnosis for this patient.  A T3 andT4 have not been      ordered.  The patient will be instructed to follow up with her      primary care physician Dr. Velna Hatchet regarding this.  Dr.      Allyne Gee can check the patient's T3 and T4, and more than likely may      need to start the patient on Synthroid (levothyroxine), as the      patient currently indicates that she is ready to go home;      therefore, I will discharge the patient home.   CONDITION ON DISCHARGE:  The patient currently states that she feels  good.  She has no current complaints and she states that she very much  so would like to be discharged from the hospital.   VITALS:  Temperature 98.6, heart rate 84, respirations 20, blood  pressure 117/59, O2 sat 98% on room air.   LABORATORY DATA:  Sodium 141, potassium 3.2, chloride 111, CO2 o f24,  glucose 109, BUN 6, creatinine 0.95.  Total bilirubin 1.1, alkaline  phosphatase 55, SGOT 56, SGPT 71, total protein 6.0, albumin 2.8,  calcium 8.9.  The patient also had a TSH completed, which was found to  be elevated on January 5th at 15.515.   DISCHARGE  MEDICATIONS:  The patient's medications at the time of her  discharge will consist of the following:  1. Ciprofloxacin 250 mg p.o. q.12 hours.  2. Protonix 40 mg 1 tablet p.o. daily.   FOLLOWUP:  She will be told to call Dr. Allyne Gee and arrange a followup  appointment for 1 to 2 weeks, at which time Dr. Allyne Gee can follow up on  the elevated TSH level and also can consider starting the patient on  thyroid hormone replacement.      Michaelyn Barter, M.D.  Electronically Signed     OR/MEDQ  D:  03/13/2006  T:  03/13/2006  Job:  [id number]

## 2010-07-24 NOTE — Procedures (Signed)
[place]  Patient:    RENDERKolette, [name]                       MRN: [id number] Proc. Date: 10/27/99 Adm. Date:  [id number] Attending:  Merwyn Katos CC:         Oley Balm Sung Amabile, M.D. [facility]             Malcolm T. Pleas Koch., M.D. [facility]                           Procedure Report  PROCEDURE: Esophagogastroduodenoscopy.  ENDOSCOPIST: Venita Lick. Pleas Koch., M.D.  INDICATIONS FOR PROCEDURE: The patient is a 77 year old female with chronic reflux symptoms, asthma, and hoarseness.  PHYSICAL EXAMINATION:  CHEST: Expiratory wheezes.  CARDIAC: Regular rate and rhythm without murmurs.  NEUROLOGIC: Alert and oriented x 3.  ANESTHESIA: Fentanyl 75 mcg IV, Versed 7 mg IV, pharyngeal cetacaine spray.  MONITORING: Automated blood pressure monitor, pulse oximetry, cardiac monitor. Low-flow oxygen was given by nasal cannula throughout the procedure.  The procedure was well tolerated, with no immediate complications.  DESCRIPTION OF PROCEDURE: After the nature of the procedure was discussed with the patient including discussion of the risks and benefits and alternatives, she consented to proceed.  She was then comfortably sedated in the left lateral decubitus position and the Olympus video endoscope was inserted into the posterior pharynx and the esophagus intubated under direct visualization. The esophagus had a normal endoscopic appearance.  The Z line measured at 38 cm from the incisors.  A very small sliding hiatal hernia was noted in the gastric cardia on forward and retroflex view.  Examination of the gastric body was normal.  On retroflex view of the angularis the lesser curvature and fundus was normal.  The gastric antrum, gastric pylorus, duodenal bulb, and descending duodenum were all normal.  The stomach was decompressed and the endoscope withdrawn from the patient.  IMPRESSION: Very small sliding hiatal hernia.  RECOMMENDATIONS:  1. Continue  Proton pump inhibitor b.i.d. along with strict anti-reflux     measures.  2. Recommend ENT consultation for further evaluation of hoarseness and     possible sinusitis.  3. Schedule a 24 hour dual probe pH study as an outpatient after recovery     from her acute hospitalization.  4. Office follow-up with me. DD:  10/27/99 TD:  10/28/99 Job: [id number]

## 2010-07-24 NOTE — Consult Note (Signed)
[place]. [facility]  Patient:    [name], [name] Visit Number: [id number] MRN: [id number]          Service Type: EMS Location: [facility] Attending Physician:  Hanley Seamen Dictated by:   Barbaraann Share, M.D. [facility] Proc. Date: 03/04/01 Admit Date:  03/04/2001   CC:         Paula Libra, M.D., ER  Oley Balm. Simonds, M.D. [facility]   Consultation Report  HISTORY OF PRESENT ILLNESS:  The patient is a very pleasant 77 year old black female with a known history of chronic asthmatic bronchitis, as well as severe vocal cord dysfunction, secondary to chronic cough and also gastroesophageal reflux disease.  She was in her usual state of health until Christmas Eve, when she began to notice increasing head and nasal congestion, as well as a cough productive only of clear mucus.  She began to have a little bit more chest congestion and increasing shortness of breath.  She came into the emergency room today, where her daughter was very concerned about her and felt like she needed to be seen.  The ER felt that the patient was having significant bronchospasm and placed her on bronchodilator therapy, as well as IV Solu-Medrol.  After speaking with me, IV magnesium was given as well.  Once I evaluated the patient it became clear that there may be a small component of asthmatic bronchitis, but most of this, however, was upper early vocal cord dysfunction.  The patient is not having fevers, chills, or sweats.  PAST MEDICAL HISTORY: 1. Significant for chronic asthmatic bronchitis. 2. History of mild coronary artery disease. 3. History of diet controlled diabetes. 4. History of hypertension. 5. History of hyperlipidemia. 6. History of DJD.  ALLERGIES:  The patient is allergic to PENICILLIN.  MEDICATIONS:  She is on multiple pulmonary medications, consisting primarily of Pulmicort and an albuterol nebulizer, as well as Serevent.  She is also on a proton pump inhibitor.  SOCIAL  HISTORY:  She has not smoked in over a year.  FAMILY HISTORY:  Totally noncontributory.  REVIEW OF SYSTEMS:  As per history of present illness.  PHYSICAL EXAMINATION:  GENERAL:  In general she is an obese black female in no acute distress.  VITAL SIGNS:  Blood pressure was 103/60, pulse is 90, respiratory rate is 24.  HEENT:  Pupils equal, round, and reactive to light and accommodation. Extraocular movements are intact.  Nares patent, no discharge.  Oropharynx shows thrush.  NECK:  Supple without JVD or lymphadenopathy.  There is palpable thyromegaly.  CHEST:  Reveals decreased breath sounds on exhalation only.  There is no true wheezing.  There is significant pseudowheezing, primarily from the laryngeal area with transmission down to the bases.  The patient currently is comfortable with no accessory muscle use.  CARDIAC:  Reveals regular rate and rhythm, no murmurs, rubs, or gallops.  ABDOMEN:  Soft, nontender, with good bowel sounds.  GENITALIA/RECTAL/BREASTS:  Exams not done, not indicated.  EXTREMITIES:  Lower extremities are without edema, good pulses distally. There is no calf tenderness.  NEUROLOGIC:  She is alert and oriented x 3, no gross motor deficits.  LABORATORY DATA:  Chest x-ray was clear of disease.  Arterial blood gas shows a PO2 of 83, PCO2 of 32, pH of 7.58 on room air.  Her electrolytes were unremarkable.  Current O2 saturations are 99% on 2 L.  IMPRESSION: 1. Probable upper respiratory infection, either viral or bacterial in nature    with significant  head congestion and nasal symptoms, leading to increase in    cough. 2. Severe vocal cord dysfunction with increased work of breathing, exacerbated    by the above URI as well as her coughing paroxysms.  I certainly cannot    100% exclude a component of asthmatic bronchitis; however, right now she is    comfortable and has no true wheezing, but only pseudowheezing from her DCD.  PLAN: 1. Will  prescribe a Tripack 500 mg q.d. for three days. 2. Tussionex to suppress cough and decrease upper airway irritation. 3. Continue on current pulmonary medications. 4. Nystatin suspension for her thrush. 5. Quick prednisone taper. 6. Follow up in the office next week to see how she has responded to the    above therapy. Dictated by:   Barbaraann Share, M.D. [facility] Attending Physician:  Hanley Seamen DD:  03/04/01 TD:  03/04/01 Job: [id number]

## 2010-07-24 NOTE — Consult Note (Signed)
[place]. [facility]  Patient:    RENDERCorleen, [name]                       MRN: [id number] Proc. Date: 10/09/99 Adm. Date:  [id number] Attending:  Annamarie Dawley                          Consultation Report  REQUESTING PHYSICIAN:  Nicoletta Dress. Colon Branch, M.D., [facility] Emergency Room.  REASON FOR CONSULTATION:  Refractory coughing and dyspnea.  HISTORY OF PRESENT ILLNESS:  This is a 77 year old black female with a history of asthmatic bronchitis, status post remote smoking cessation, just seen on July 27 by Dr. Sung Amabile and felt to have a component of vocal cord dysfunction syndrome.  He referred her to the GI division for refractory reflux symptoms and switched her to Protonix.  She states that she has been taking this, although it is not clear that she is taking it before eating, and been also using a combination of Serevent and Pulmicort with progressive dyspnea associated with hoarseness and upper airway coughing over the last week.  She came to the emergency room with dyspnea at rest and has failed to respond to nebulized treatments.  She is therefore on a magnesium sulfate infusion.  Her chest x-rays have been normal.  The patient denies any chest pain, pleuritic or otherwise.  She denies any nausea, vomiting, or overt reflux symptoms or sinus complaints.  She was told to stop theophylline in the past, and Dr. Sung Amabile has informed me that he thought she was taking it.  She assures me that she has not taken it in "weeks," although she was not able to name all of her medications, indicating she may be getting confused with names of medications and specific instructions.  PAST MEDICAL HISTORY: 1. Chronic asthmatic bronchitis. 2. Mild coronary artery disease, felt to be of no significance per her last    H&P (February 24, 1999). 3. History of diabetes, which is controlled with diet. 4. History of hypertension and hyperlipidemia. 5. History of degenerative  arthritis.  ALLERGIES:  PENICILLIN causes urticaria.  MEDICATIONS:  Procardia 30 mg q.d., Accolate 20 mg b.i.d., Pulmicort two puffs b.i.d., albuterol nebulizer p.r.n., Serevent two puffs b.i.d., Prevacid or Protonix daily.  SOCIAL HISTORY:  She is a remote smoker.  She lives independently.  FAMILY HISTORY:  Negative for respiratory disease or atopy.  REVIEW OF SYSTEMS:  Taken in detail and essentially negative except as noted above.  PHYSICAL EXAMINATION:  GENERAL:  This is a somewhat anxious but pleasant black female, who is able to lie back at less than 30 degrees elevated in her bed.  The most notable aspect of her exam is moderate hoarseness (she says that she "always talks in a baritone voice when she is having an asthma flare-up").  HEENT:  Remarkably unremarkable.  Nasal turbinates are normal.  There is no evidence of excessive postnasal drainage or cobblestoning.  She does clear her throat frequently during the interview and has an upper airway-sounding cough which is dry in nature.  NECK:  Supple without palpable cervical adenopathy or thyromegaly.  Trachea is midline.  LUNGS:  Lung fields are completely clear bilaterally with only pseudo-wheezing audible.  Overall air movement is adequate.  HEART:  There is regular rate and rhythm without murmur, gallop, or rub present.  ABDOMEN:  Soft and benign with negative Hoover sign.  EXTREMITIES:  Warm without  calf tenderness, cyanosis, clubbing, or edema.  NEUROLOGIC:  No focal deficits, _____.  SKIN:  Warm and dry.  DIAGNOSTIC EVALUATION:  Chest x-ray revealed no acute abnormality.  Hemoglobin saturation was 92% on room air.  IMPRESSION:  Vocal cord dysfunction syndrome masquerading as "status asthmaticus."  The key here is controlling reflux aggressively and avoiding MDIs, which sometimes can trigger upper airway instability because of the cold aerosol that is used as a propellant.  I explained all this to the  patient in detail and spent extra time with her going over each and every one of her medications and writing out a medication plan as follows:  1. She should stop Accolate, Serevent for now. 2. She should increase Pulmicort to four puffs b.i.d. and leave it there. 3. She should take either Protonix or Nexium (samples provided), dose b.i.d.    before meals until she sees Dr. Sung Amabile. 4. Depo-Medrol 120 mg IM was given today, but no further systemic steroids    should be necessary. 5. Tussionex one teaspoon p.o. was given now with further dosing every four    hours as needed to suppressive excessive cough and throat clearing.  I have    also strongly recommended lozenges for the same purpose.  Note that she does have a home nebulizer to use every three hours p.r.n. symptomatic wheezing, but I would rather her use the nebulizer and avoid MDIs for now.  I have asked for her to see Dr. Sung Amabile in the office every week until we can improve her condition without resulting to trips to the ER.  She has also been referred to GI for refractory reflux. DD:  10/09/99 TD:  10/10/99 Job: [id number]

## 2010-07-24 NOTE — H&P (Signed)
NAME:  [name], [name]              ACCOUNT NO.:  [id number]   MEDICAL RECORD NO.:  [id number]          PATIENT TYPE:  INP   LOCATION:  6743                         FACILITY:  [facility]   PHYSICIAN:  Della Goo, M.D. DATE OF BIRTH:  November 03, 1929   DATE OF ADMISSION:  03/11/2006  DATE OF DISCHARGE:                              HISTORY & PHYSICAL   PRIMARY CARE PHYSICIAN:  Robyn N. Allyne Gee, M.D.   CHIEF COMPLAINT:  Nausea, vomiting, diarrhea.   HISTORY OF PRESENT ILLNESS:  This is a 77 year old female who presented  to the emergency department secondary to complaints of nausea, vomiting  and diarrhea for 5 days.  The patient was seen by her primary care  physician and referred to the emergency department for further treatment  and possible admission.  The patient was seen in the emergency  department, administered IV fluids and IV Zofran for nausea without  relief and then referred for admission.  The patient reports having  crampy left lower quadrant abdominal pain as well.  The patient reports  having decreased p.o. intake as well for 5 days.   PAST MEDICAL HISTORY:  Significant for rheumatoid arthritis,  hypertension, asthma.   PAST SURGICAL HISTORY:  None.   MEDICATIONS:  To be appended.  The patient's daughter will call with  medications and their dosages.   SOCIAL HISTORY:  The patient quit smoking 15 years ago.  Reports  drinking 2 mixed drinks about every other day.   FAMILY HISTORY:  Noncontributory.   PHYSICAL EXAMINATION FINDINGS:  GENERAL:  A 77 year old well-nourished,  well-developed female in no acute distress currently.  VITAL SIGNS:  Temperature 97.5, blood pressure 96/74, heart rate 84,  respirations 16, O2 saturation 99% on room air.  HEENT:  Normocephalic, atraumatic.  Pupils equally round, reactive to  light.  Extraocular muscles are intact.  Funduscopic benign.  ABDOMEN:  Positive bowel sounds, soft, nontender on palpation.  No  rebound, no guarding.  EXTREMITIES:  Without edema.  GENITOURINARY:  Deferred.  RECTAL:  Deferred.  NEUROLOGIC:  Generalized weakness.  The patient is alert and oriented  x3.  There are no focal deficits.   Laboratory studies reveal a white blood cell count of 7.4, 68%  neutrophils 22% lymphocytes, hemoglobin 12.5, hematocrit 36.9 and  platelets 185, MCV 88.9.  sodium 137, potassium 3.1, chloride 101, CO2  25, BUN 15, creatinine 1.7, glucose 107.  Urinalysis:  Positive  leukocyte esterase which are moderate, urine nitrites are negative,  large urine bilirubin, urine glucose negative.  Cardiac enzymes revealed  a myoglobin of 220, CK-MB less than 1.0, and troponin less than 0.05.   ASSESSMENT:  A 77 year old female with nausea, vomiting and diarrhea for  5 days, being admitted with:   1. Intractable nausea, vomiting, diarrhea.  2. Dehydration.  3. Hypokalemia.  4. Hypotension.  5. History of rheumatoid arthritis.  6. History of asthma.   PLAN:  The patient will be made n.p.o. and placed on IV fluids for  rehydration therapy.  Antiemetics have been ordered IV.  The patient's  medications will be held overnight.  They will be restarted  once the  patient is able to take p.o.'s.  The patient has been written for  potassium replacement therapy IV, and a 3-way abdomen has been ordered  and liver function tests will also be ordered.      Della Goo, M.D.  Electronically Signed     HJ/MEDQ  D:  03/11/2006  T:  03/12/2006  Job:  [id number]   cc:   Candyce Churn. Allyne Gee, M.D.

## 2010-07-24 NOTE — H&P (Signed)
[place]. [name]  Patient:    [name], [name]                       MRN: [id number] Adm. Date:  [id number] Attending:  Merwyn Katos                         History and Physical  DATE OF BIRTH:  1929-10-26.  ADMISSION DIAGNOSIS:  Respiratory failure secondary to acute exacerbation of chronic asthmatic bronchitis.  HISTORY OF PRESENT ILLNESS:  Ms. Googe is a 77 year old African-American woman whom I know very well from following in the office over the past two years or so; she has also been hospitalized on at least two occasions during that time.  She has very difficult-to-control chronic asthmatic bronchitis with complicating features of severe gastroesophageal reflux disease, vocal cord dysfunction and probable chronic bronchitis.  She presents with a week of increasing dyspnea, wheezing and cough.  She was awakened at 4 a.m. on the morning of admission with severe shortness of breath and a vague discomfort in her chest, just above the xiphoid process.  The chest discomfort was nonradiating.  The chest discomfort was not related to exertion or meals.  She denies fever or purulent sputum.  She has had no hemoptysis.  She denies lower extremity edema and calf tenderness.  PAST MEDICAL HISTORY 1. Chronic asthmatic bronchitis. 2. Gastroesophageal reflux disease. 3. Hypertension. 4. Vocal cord dysfunction.  CURRENT MEDICATIONS:  She does not know her exact medications but she does take Pulmicort dry powder inhaler, albuterol and Atrovent by nebulizer, Nexium, Procardia XL (unknown dose).  SOCIAL HISTORY:  She has a remote smoking history.  At her best, she is able to perform all duties of daily living.  She has a remote smoking history.  She previously resided in [place] and moved to [place] approximately two to three years ago.  FAMILY HISTORY:  This has been reviewed previously and is noncontributory.  REVIEW OF SYSTEMS:  Review  of systems is as per the history of present illness.  In addition, she denies nausea, vomiting or abdominal pain.  Her bowel movements are normal.  No dysuria.  No change in her bladder function.  PHYSICAL EXAMINATION  VITAL SIGNS:  She is afebrile and vital signs are normal.  Oxygen saturation is 94% on 2 L by nasal cannula.  GENERAL:  She is in mild respiratory distress at rest.  HEENT:  Normal tympanic canals and membranes.  Nasal mucosa is pink and moist. Oropharynx reveals erythema with no exudate.  NECK:  Supple without jugular venous distention or adenopathy.  There is no stridor heard over her upper airway.  CHEST:  Examination reveals a normal percussion, although breath sounds are moderately diminished with coarse diffuse wheezes but no areas of consolidation are noted.  CARDIAC:  Regular rate and rhythm with no murmurs.  ABDOMEN:  Obese, soft, nontender, with normal bowel sounds.  EXTREMITIES:  Without clubbing, cyanosis nor edema.  Distal pulses are 2+.  NEUROLOGIC:  Exam reveals no acute deficits.  DATA:  Chest x-ray reveals no acute infiltrates nor edema.  Arterial blood gas reveals a pH of 7.28 with a PCO2 of 66 and a PO2 of 60 on room air.  Chemistries are notable only for a glucose of 171 and a potassium of 3.2.  CBC is normal.  IMPRESSION AND PLAN:  Acute exacerbation of chronic asthmatic bronchitis with complicating  features of gastroesophageal reflux disease, vocal cord dysfunction and chronic sinusitis.  I failed to mention above that recent CT scan of the sinuses at [facility] did demonstrate mild changes of chronic sinusitis.  She has respiratory failure as evidenced by respiratory acidosis.  She requires hospitalization and will be treated with intravenous corticosteroids, antibiotics, frequent nebulized bronchodilators; she will also be treated with Afrin and a nasal steroid for chronic sinusitis.  She will remain on a proton pump  inhibitor for gastroesophageal reflux disease; also, she takes Reglan at home and will continue on this medication q.a.c. and h.s.  I intend to have gastroenterology to see her while hospitalized, as I believe that gastroesophageal reflux disease is a major exacerbating feature of her airways disease.  She was already scheduled to see Dr. Barbette Hair. Kaplan on Thursday of this week. DD:  10/25/99 TD:  10/26/99 Job: [id number]

## 2010-12-09 LAB — ETHANOL: Alcohol, Ethyl (B): 212 — ABNORMAL HIGH

## 2010-12-09 LAB — POCT I-STAT, CHEM 8
Chloride: 109
Creatinine, Ser: 1.4 — ABNORMAL HIGH
Glucose, Bld: 88
HCT: 38
Potassium: 3.1 — ABNORMAL LOW

## 2010-12-09 LAB — URINE MICROSCOPIC-ADD ON

## 2010-12-09 LAB — DIFFERENTIAL
Lymphs Abs: 2.8
Monocytes Relative: 6
Neutro Abs: 2.3
Neutrophils Relative %: 41 — ABNORMAL LOW

## 2010-12-09 LAB — URINALYSIS, ROUTINE W REFLEX MICROSCOPIC
Bilirubin Urine: NEGATIVE
Nitrite: NEGATIVE
Specific Gravity, Urine: 1.019
pH: 5

## 2010-12-09 LAB — CBC
RBC: 4.3
WBC: 5.6

## 2010-12-09 LAB — RAPID URINE DRUG SCREEN, HOSP PERFORMED
Amphetamines: NOT DETECTED
Tetrahydrocannabinol: NOT DETECTED

## 2010-12-22 LAB — BASIC METABOLIC PANEL
BUN: 6
CO2: 23
Calcium: 7.5 — ABNORMAL LOW
Calcium: 8.6
Creatinine, Ser: 0.76
GFR calc Af Amer: >60
GFR calc Af Amer: >60
GFR calc non Af Amer: >60
Glucose, Bld: 85
Glucose, Bld: 96
Sodium: 138

## 2010-12-22 LAB — CBC
HCT: 30.5 — ABNORMAL LOW
Hemoglobin: 10 — ABNORMAL LOW
Hemoglobin: 10.8 — ABNORMAL LOW
MCHC: 33.6
MCHC: 33.7
MCHC: 33.8
MCV: 85.9
MCV: 87
Platelets: 234
Platelets: 251
RBC: 3.44 — ABNORMAL LOW
RBC: 3.48 — ABNORMAL LOW
RBC: 3.51 — ABNORMAL LOW
RBC: 3.6 — ABNORMAL LOW
RBC: 3.65 — ABNORMAL LOW
RDW: 15.6 — ABNORMAL HIGH
RDW: 16.1 — ABNORMAL HIGH
WBC: 10.9 — ABNORMAL HIGH
WBC: 9.6

## 2010-12-23 LAB — CBC
HCT: 30.7 — ABNORMAL LOW
HCT: 33 — ABNORMAL LOW
Hemoglobin: 10.3 — ABNORMAL LOW
Hemoglobin: 11 — ABNORMAL LOW
MCHC: 33.5
MCV: 85.3
MCV: 86.6
RBC: 3.55 — ABNORMAL LOW
RBC: 3.87
WBC: 12.8 — ABNORMAL HIGH
WBC: 13.1 — ABNORMAL HIGH

## 2010-12-23 LAB — HEMOGLOBIN A1C: Hgb A1c MFr Bld: 5.7

## 2010-12-23 LAB — HEPATIC FUNCTION PANEL
ALT: 14
AST: 20
Albumin: 3.1 — ABNORMAL LOW
Bilirubin, Direct: <0.1
Total Bilirubin: 0.6

## 2010-12-23 LAB — I-STAT 8, (EC8 V) (CONVERTED LAB)
BUN: 28 — ABNORMAL HIGH
Bicarbonate: 14.7 — ABNORMAL LOW
Chloride: 113 — ABNORMAL HIGH
HCT: 37
Operator id: 277751
pCO2, Ven: 32.8 — ABNORMAL LOW
pH, Ven: 7.259

## 2010-12-23 LAB — ABO/RH: ABO/RH(D): O POS

## 2010-12-23 LAB — CROSSMATCH: Antibody Screen: NEGATIVE

## 2010-12-23 LAB — PROTIME-INR: INR: 1.2

## 2010-12-23 LAB — APTT: aPTT: 27

## 2011-03-05 ENCOUNTER — Other Ambulatory Visit: Payer: Self-pay

## 2011-03-05 ENCOUNTER — Encounter: Payer: Self-pay | Admitting: *Deleted

## 2011-03-05 ENCOUNTER — Inpatient Hospital Stay (HOSPITAL_COMMUNITY)
Admission: EM | Admit: 2011-03-05 | Discharge: 2011-03-15 | DRG: 377 | Disposition: A | Discharge status: Home / Self Care | Payer: Medicare Other | Attending: Internal Medicine | Admitting: Internal Medicine

## 2011-03-05 DIAGNOSIS — E039 Hypothyroidism, unspecified: Secondary | ICD-10-CM | POA: Diagnosis present

## 2011-03-05 DIAGNOSIS — K5731 Diverticulosis of large intestine without perforation or abscess with bleeding: Principal | ICD-10-CM | POA: Diagnosis present

## 2011-03-05 DIAGNOSIS — E876 Hypokalemia: Secondary | ICD-10-CM | POA: Diagnosis present

## 2011-03-05 DIAGNOSIS — K219 Gastro-esophageal reflux disease without esophagitis: Secondary | ICD-10-CM | POA: Diagnosis present

## 2011-03-05 DIAGNOSIS — Z79899 Other long term (current) drug therapy: Secondary | ICD-10-CM

## 2011-03-05 DIAGNOSIS — N179 Acute kidney failure, unspecified: Secondary | ICD-10-CM | POA: Diagnosis present

## 2011-03-05 DIAGNOSIS — I10 Essential (primary) hypertension: Secondary | ICD-10-CM | POA: Diagnosis present

## 2011-03-05 DIAGNOSIS — D62 Acute posthemorrhagic anemia: Secondary | ICD-10-CM | POA: Diagnosis present

## 2011-03-05 DIAGNOSIS — K921 Melena: Secondary | ICD-10-CM | POA: Diagnosis present

## 2011-03-05 DIAGNOSIS — Z23 Encounter for immunization: Secondary | ICD-10-CM

## 2011-03-05 DIAGNOSIS — J45909 Unspecified asthma, uncomplicated: Secondary | ICD-10-CM | POA: Diagnosis present

## 2011-03-05 DIAGNOSIS — I5033 Acute on chronic diastolic (congestive) heart failure: Secondary | ICD-10-CM | POA: Diagnosis not present

## 2011-03-05 DIAGNOSIS — E119 Type 2 diabetes mellitus without complications: Secondary | ICD-10-CM | POA: Diagnosis present

## 2011-03-05 DIAGNOSIS — I509 Heart failure, unspecified: Secondary | ICD-10-CM | POA: Diagnosis present

## 2011-03-05 DIAGNOSIS — Z880 Allergy status to penicillin: Secondary | ICD-10-CM

## 2011-03-05 DIAGNOSIS — Z87891 Personal history of nicotine dependence: Secondary | ICD-10-CM

## 2011-03-05 DIAGNOSIS — K922 Gastrointestinal hemorrhage, unspecified: Secondary | ICD-10-CM

## 2011-03-05 DIAGNOSIS — I959 Hypotension, unspecified: Secondary | ICD-10-CM | POA: Diagnosis present

## 2011-03-05 HISTORY — DX: Encounter for other specified aftercare: Z51.89

## 2011-03-05 HISTORY — DX: Reserved for inherently not codable concepts without codable children: IMO0001

## 2011-03-05 LAB — BASIC METABOLIC PANEL
BUN: 21 mg/dL (ref 6–23)
Creatinine, Ser: 1.25 mg/dL — ABNORMAL HIGH (ref 0.50–1.10)
GFR calc non Af Amer: 40 mL/min — ABNORMAL LOW (ref >90)
Glucose, Bld: 166 mg/dL — ABNORMAL HIGH (ref 70–99)
Potassium: 2.5 mEq/L — CL (ref 3.5–5.1)

## 2011-03-05 LAB — HEPATIC FUNCTION PANEL
ALT: 8 U/L (ref 0–35)
AST: 11 U/L (ref 0–37)
Alkaline Phosphatase: 61 U/L (ref 39–117)
Bilirubin, Direct: <0.1 mg/dL (ref 0.0–0.3)
Total Bilirubin: 0.3 mg/dL (ref 0.3–1.2)

## 2011-03-05 LAB — CBC
HCT: 39.2 % (ref 36.0–46.0)
MCH: 29.2 pg (ref 26.0–34.0)
MCHC: 33.2 g/dL (ref 30.0–36.0)
MCV: 88.1 fL (ref 78.0–100.0)
Platelets: 260 10*3/uL (ref 150–400)
RDW: 16.4 % — ABNORMAL HIGH (ref 11.5–15.5)

## 2011-03-05 LAB — APTT: aPTT: 26 seconds (ref 24–37)

## 2011-03-05 MED ORDER — DIPHENHYDRAMINE HCL 50 MG/ML IJ SOLN
25.0000 mg | Freq: Once | INTRAMUSCULAR | Status: AC
  Administered 2011-03-06: 25 mg via INTRAVENOUS
  Filled 2011-03-05: qty 1

## 2011-03-05 NOTE — ED Notes (Signed)
Critical potassium of 2.5 reported to Dr. Radford Pax.

## 2011-03-05 NOTE — ED Notes (Signed)
Pt to ED c/o feeling weak.  Pt st's she started having rectal bleeding this am Pt went into bathroom in triage and had bright red stool. Pt diaphoretic and moaning.

## 2011-03-05 NOTE — ED Provider Notes (Signed)
History     CSN: [id number]  Arrival date & time 03/05/11  2129   First MD Initiated Contact with Patient 03/05/11 2218      Chief Complaint  Patient presents with  . GI Bleeding    (Consider location/radiation/quality/duration/timing/severity/associated sxs/prior treatment) HPI Patient presents emergency department feeling weak with rectal bleeding which started this morning.  When patient arrived at the emergency department she was hypotensive and diaphoretic.  She was brought back to the emergency department and was diaphoretic.  Patient has previous history of upper GI bleed from a Mallory-Weiss tear.  Patient does not recall having colonoscopy in the past.  Patient has never had this significant of rectal bleeding.   No past medical history on file.  No past surgical history on file.  No family history on file.  History  Substance Use Topics  . Smoking status: Not on file  . Smokeless tobacco: Not on file  . Alcohol Use: Not on file    OB History    Grav Para Term Preterm Abortions TAB SAB Ect Mult Living                  Review of Systems  Unable to perform ROS: Unstable vital signs    Allergies  Penicillins  Home Medications   Current Outpatient Rx  Name Route Sig Dispense Refill  . ASPIRIN 81 MG PO CHEW Oral Chew 81 mg by mouth daily.        BP 132/67  Pulse 64  Resp 18  SpO2 100%  Physical Exam  Nursing note and vitals reviewed. Constitutional: She is oriented to person, place, and time. She appears well-developed and well-nourished. She appears distressed.  HENT:  Head: Normocephalic and atraumatic.  Eyes: Pupils are equal, round, and reactive to light.  Neck: Normal range of motion.  Cardiovascular: Normal rate and intact distal pulses.   Pulmonary/Chest: No respiratory distress.  Abdominal: Normal appearance. She exhibits no distension. There is no tenderness.       Patient has gross blood per rectum  Musculoskeletal: Normal range of  motion.  Neurological: She is alert and oriented to person, place, and time. No cranial nerve deficit.  Skin: Skin is warm. No rash noted. She is diaphoretic.  Psychiatric: She has a normal mood and affect. Her behavior is normal.    ED Course  Procedures (including critical care time) CRITICAL CARE Performed by: Nelva Nay L   Total critical care time: 30 min  Critical care time was exclusive of separately billable procedures and treating other patients.  Critical care was necessary to treat or prevent imminent or life-threatening deterioration.  Critical care was time spent personally by me on the following activities: development of treatment plan with patient and/or surrogate as well as nursing, discussions with consultants, evaluation of patient's response to treatment, examination of patient, obtaining history from patient or surrogate, ordering and performing treatments and interventions, ordering and review of laboratory studies, ordering and review of radiographic studies, pulse oximetry and re-evaluation of patient's condition. Labs Reviewed  CBC - Abnormal; Notable for the following:    RDW 16.4 (*)    All other components within normal limits  HEPATIC FUNCTION PANEL - Abnormal; Notable for the following:    Total Protein 5.4 (*)    Albumin 2.5 (*)    All other components within normal limits  BASIC METABOLIC PANEL - Abnormal; Notable for the following:    Potassium 2.5 (*)    Glucose, Bld 166 (*)  Creatinine, Ser 1.25 (*)    Calcium 8.3 (*)    GFR calc non Af Amer 40 (*)    GFR calc Af Amer 46 (*)    All other components within normal limits  PROTIME-INR  APTT  PREPARE RBC (CROSSMATCH)  TYPE AND SCREEN   No results found.   1. Lower GI bleed   2. Hypotension     I spoke with her gastroenterologist and discussed the case is dictated with him.  Critical care has also been consulted and will evaluate for possible ICU admission.  If no ICU admission and  patient be admitted to hospitalist on step down unit.  MDM          Nelia Shi, MD 03/05/11 [phone]

## 2011-03-05 NOTE — ED Notes (Signed)
Pt assisted to bathroom by tech massive amount of blood coming from bottom. Waiting for room to open up to take pt back to be seen.

## 2011-03-06 ENCOUNTER — Encounter (HOSPITAL_COMMUNITY): Payer: Self-pay

## 2011-03-06 ENCOUNTER — Emergency Department (HOSPITAL_COMMUNITY): Payer: Medicare Other

## 2011-03-06 DIAGNOSIS — K922 Gastrointestinal hemorrhage, unspecified: Secondary | ICD-10-CM

## 2011-03-06 DIAGNOSIS — R1084 Generalized abdominal pain: Secondary | ICD-10-CM

## 2011-03-06 DIAGNOSIS — I959 Hypotension, unspecified: Secondary | ICD-10-CM

## 2011-03-06 DIAGNOSIS — E876 Hypokalemia: Secondary | ICD-10-CM

## 2011-03-06 LAB — HEMOGLOBIN AND HEMATOCRIT, BLOOD
HCT: 31.1 % — ABNORMAL LOW (ref 36.0–46.0)
Hemoglobin: 10.4 g/dL — ABNORMAL LOW (ref 12.0–15.0)

## 2011-03-06 MED ORDER — POTASSIUM CHLORIDE CRYS ER 20 MEQ PO TBCR
40.0000 meq | EXTENDED_RELEASE_TABLET | Freq: Once | ORAL | Status: AC
  Administered 2011-03-06: 40 meq via ORAL
  Filled 2011-03-06: qty 2

## 2011-03-06 MED ORDER — ONDANSETRON HCL 4 MG/2ML IJ SOLN: 4.0000 mg | Freq: Four times a day (QID) | INTRAMUSCULAR | Status: DC | PRN

## 2011-03-06 MED ORDER — PANTOPRAZOLE SODIUM 40 MG IV SOLR
8.0000 mg/h | INTRAVENOUS | Status: DC
  Administered 2011-03-06 – 2011-03-09 (×7): 8 mg/h via INTRAVENOUS
  Filled 2011-03-06 (×20): qty 80

## 2011-03-06 MED ORDER — OXYCODONE HCL 5 MG PO TABS
5.0000 mg | ORAL_TABLET | ORAL | Status: DC | PRN
  Administered 2011-03-06 – 2011-03-08 (×2): 5 mg via ORAL
  Filled 2011-03-06 (×3): qty 1

## 2011-03-06 MED ORDER — ALUM & MAG HYDROXIDE-SIMETH 200-200-20 MG/5ML PO SUSP: 30.0000 mL | Freq: Four times a day (QID) | ORAL | Status: DC | PRN

## 2011-03-06 MED ORDER — ACETAMINOPHEN 650 MG RE SUPP: 650.0000 mg | Freq: Four times a day (QID) | RECTAL | Status: DC | PRN

## 2011-03-06 MED ORDER — ZOLPIDEM TARTRATE 5 MG PO TABS
5.0000 mg | ORAL_TABLET | Freq: Every evening | ORAL | Status: DC | PRN
  Administered 2011-03-06 – 2011-03-14 (×4): 5 mg via ORAL
  Filled 2011-03-06 (×4): qty 1

## 2011-03-06 MED ORDER — ONDANSETRON HCL 4 MG PO TABS: 4.0000 mg | ORAL_TABLET | Freq: Four times a day (QID) | ORAL | Status: DC | PRN

## 2011-03-06 MED ORDER — IOHEXOL 300 MG/ML  SOLN
100.0000 mL | Freq: Once | INTRAMUSCULAR | Status: AC | PRN
  Administered 2011-03-06: 100 mL via INTRAVENOUS

## 2011-03-06 MED ORDER — HYDROMORPHONE HCL PF 1 MG/ML IJ SOLN
0.5000 mg | INTRAMUSCULAR | Status: DC | PRN
  Administered 2011-03-07: 1 mg via INTRAVENOUS
  Filled 2011-03-06: qty 1

## 2011-03-06 MED ORDER — ACETAMINOPHEN 325 MG PO TABS
650.0000 mg | ORAL_TABLET | Freq: Four times a day (QID) | ORAL | Status: DC | PRN
  Administered 2011-03-09: 650 mg via ORAL
  Filled 2011-03-06: qty 2

## 2011-03-06 MED ORDER — IOHEXOL 300 MG/ML  SOLN
20.0000 mL | Freq: Once | INTRAMUSCULAR | Status: AC | PRN
  Administered 2011-03-06: 20 mL via ORAL

## 2011-03-06 MED ORDER — POTASSIUM CHLORIDE IN NACL 40-0.9 MEQ/L-% IV SOLN
INTRAVENOUS | Status: DC
  Administered 2011-03-06 – 2011-03-10 (×6): via INTRAVENOUS
  Filled 2011-03-06 (×15): qty 1000

## 2011-03-06 NOTE — ED Notes (Signed)
Pt has finished oral contrast prep. CT notified.

## 2011-03-06 NOTE — ED Notes (Signed)
Pt drinking oral contrast prep for abd CT/

## 2011-03-06 NOTE — ED Notes (Signed)
Pt OOB to [facility]SC; tol well . Denies dizziness. VSS.

## 2011-03-06 NOTE — Consult Note (Addendum)
[name] Consultation  Referring Provider: Doreen Salvage, MD Primary Care Physician:  Dorothyann Peng, MD Primary Gastroenterologist:  Claudette Head, MD  Reason for Consultation:  hematochezia  HPI: [name] is a 77 y.o. female who noted the sudden onset of mild generalized abd pain and bloody diarrhea yesterday morning. She had 3 episodes of rectal bleeding and presented to [facility] ED. She was initially hypotensive with SBP of 60 however she has remained hemodynamically stable since initial presentation. She has had 3-4 more episodes of dark red blood per rectum since arrival to ED last night. Abd pain has improved. She noted frequent heartburn for several days. She related a cough with yellow sputum for about 2 weeks. She denies any new medication and OTC medications. She had EGD in 2008 for hematemesis with a Mallory Weiss tear and erosive esophagitis noted. I recommended a screening colonoscopy however she does not recall having that done and I cannot find any records of a colonoscopy in [facility]. Denies weight loss, abdominal pain, constipation, diarrhea, change in stool caliber, melena, hematochezia, nausea, vomiting, dysphagia, chest pain.  Past Medical History:  1. Vitiligo  2. Mallory Weiss Tear, 2008  3. GERD/Erosive esophagitis 4. Diabetes mellitus  5. Hypothyroidism  6. Hyperlipidemia  7. Hypertension 8. Memory deficits  Past Surgical History:  Chest Tube placement for ?Pneumothorax or Empyema   Prior to Admission medications   Medication Sig Start Date End Date Taking? Authorizing Provider  aspirin 81 MG chewable tablet Chew 81 mg by mouth daily.     Yes Historical Provider, MD    Current Facility-Administered Medications  Medication Dose Route Frequency Provider Last Rate Last Dose  . 0.9 % NaCl with KCl 40 mEq / L  infusion   Intravenous Continuous Ron Parker, MD      . diphenhydrAMINE (BENADRYL) injection 25 mg  25 mg Intravenous Once Nelia Shi, MD   25 mg at 03/06/11 0057  . pantoprazole (PROTONIX) 80 mg in sodium chloride 0.9 % 250 mL infusion  8 mg/hr Intravenous Continuous Ron Parker, MD       Current Outpatient Prescriptions  Medication Sig Dispense Refill  . aspirin 81 MG chewable tablet Chew 81 mg by mouth daily.          Allergies as of 03/05/2011 - Review Complete 03/05/2011  Allergen Reaction Noted  . Penicillins Other (See Comments) 03/05/2011    No family history on file.  History   Social History  . Marital Status: Widowed    Spouse Name: N/A    Number of Children: N/A  . Years of Education: N/A   Occupational History  . Not on file.   Social History Main Topics  . Smoking status: Not on file  . Smokeless tobacco: Not on file  . Alcohol Use: Not on file  . Drug Use: Not on file  . Sexually Active: Not on file   Other Topics Concern  . Not on file   Social History Narrative  . No narrative on file    Review of Systems:  As in HPI-all other systems negative  Physical Exam: Vital signs in last 24 hours: Temp:  [97.7 F (36.5 C)-98.3 F (36.8 C)] 97.9 F (36.6 C) (12/29 0626) Pulse Rate:  [40-75] 71  (12/29 1100) Resp:  [14-25] 25  (12/29 1100) BP: (66-163)/(42-102) 125/76 mmHg (12/29 1100) SpO2:  [85 %-100 %] 100 % (12/29 1100)   General:   Alert,  well-developed, well-nourished, pleasant and cooperative  in NAD Head:  Normocephalic and atraumatic. Eyes:  Sclera clear, no icterus. Conjunctiva pink. Ears:  Normal auditory acuity. Nose:  No deformity, discharge,  or lesions. Mouth:  No deformity or lesions.  Oropharynx pink & moist. Neck:  Supple; no masses or thyromegaly. Lungs:  Clear throughout to auscultation.   No wheezes, crackles, or rhonchi. No acute distress. Heart:  Regular rate and rhythm; no murmurs, clicks, rubs,  or gallops. Abdomen:  Soft, mild generalized tenderness without rebound or guarding and nondistended. No masses, hepatosplenomegaly or hernias  noted. Normal bowel sounds Rectal:  No lesions, dark red blood in rectum, 4+ heme positive  Msk:  Symmetrical without gross deformities. Normal posture. Pulses:  Normal pulses noted. Extremities:  Without clubbing or edema. Neurologic:  Alert and  oriented x4; memory deficits. Skin:  Intact without significant lesions or rashes. Cervical Nodes:  No significant cervical adenopathy. Psych:  Alert and cooperative. Normal mood and affect.  Intake/Output from previous day: 12/28 0701 - 12/29 0700 In: 700 [Blood:700] Out: -  Intake/Output this shift:    Lab Results:  Basename 03/06/11 1055 03/05/11 2240  WBC -- 9.4  HGB 10.4* 13.0  HCT 31.1* 39.2  PLT -- 260   BMET  Basename 03/06/11 0546 03/05/11 2240  NA -- 139  K 3.2* 2.5*  CL -- 105  CO2 -- 22  GLUCOSE -- 166*  BUN -- 21  CREATININE -- 1.25*  CALCIUM -- 8.3*   LFT  Basename 03/05/11 2240  PROT 5.4*  ALBUMIN 2.5*  AST 11  ALT 8  ALKPHOS 61  BILITOT 0.3  BILIDIR <0.1  IBILI NOT CALCULATED   PT/INR  Basename 03/05/11 2240  LABPROT 14.6  INR 1.12    Previous Endoscopies: See HPI  Impression/Recommendations:  1. Hematochezia, diarrhea, abd pain, hypotension. R/O diverticular bleed, neoplasm, AVM and ischemic colitis. Clear liquids for now. Monitor H/H closely. Should be OK for a regular medical bed instead of step down since she has had a stable BP overnight-defer decision to primary service. Proceed with abd/pelvic CT scan today. Colonoscopy is likely the next step pending CT findings.  2. Hypotension which seems out of proportion to the amount of GI bleeding. Primary service to evaluate for other causes.   3. History of GERD/EE. Continue PPI.  4. Cough-per primary service.  5. Hypokalemia-per primary service.    LOS: 1 day   Malcolm T. Russella Dar  MD [facility] 03/06/2011, 11:23 AM    The recent consult H&P (dated 03/06/11) was reviewed, the patient was examined and there is no change in the patients  condition since that H&P was completed. Will proceed with colonoscopy, +/- EGD as discussed with pt by Dr. Russella Dar yesterday and with me, the patient and her daughter this am.   Beverley Fiedler  03/08/2011, 8:58 AM

## 2011-03-06 NOTE — ED Notes (Signed)
Pharmacy called to obtain IV fluids and protonix ordered this am, not yet received.

## 2011-03-06 NOTE — Progress Notes (Signed)
Patient ID: [name], female   DOB: 28-Aug-1929, 77 y.o.   MRN: [id number] Subjective: No events overnight. Patient denies chest pain, shortness of breath, abdominal pain.   Objective:  Vital signs in last 24 hours:  Filed Vitals:   03/06/11 1330 03/06/11 1345 03/06/11 1400 03/06/11 1523  BP: 131/64 129/82 124/70 145/93  Pulse: 68 70 67   Temp:    97.7 F (36.5 C)  TempSrc:    Oral  Resp: 17 19 18 16   SpO2: 100% 100% 100% 100%    Intake/Output from previous day:   Intake/Output Summary (Last 24 hours) at 03/06/11 1546 Last data filed at 03/06/11 1506  Gross per 24 hour  Intake    700 ml  Output      0 ml  Net    700 ml    Physical Exam: General: Alert, awake, oriented x3, in no acute distress. HEENT: No bruits, no goiter. Moist mucous membranes, no scleral icterus, no conjunctival pallor. Heart: Regular rate and rhythm, S1/S2 +, no murmurs, rubs, gallops. Lungs: Clear to auscultation bilaterally. No wheezing, no rhonchi, no rales.  Abdomen: Soft, nontender, nondistended, positive bowel sounds. Extremities: No clubbing or cyanosis, no pitting edema,  positive pedal pulses. Neuro: Grossly nonfocal.  Lab Results:  Basic Metabolic Panel:    Component Value Date/Time   NA 139 03/05/2011 2240   K 3.2* 03/06/2011 0546   CL 105 03/05/2011 2240   CO2 22 03/05/2011 2240   BUN 21 03/05/2011 2240   CREATININE 1.25* 03/05/2011 2240   GLUCOSE 166* 03/05/2011 2240   CALCIUM 8.3* 03/05/2011 2240   CBC:    Component Value Date/Time   WBC 9.4 03/05/2011 2240   HGB 10.4* 03/06/2011 1055   HCT 31.1* 03/06/2011 1055   PLT 260 03/05/2011 2240   MCV 88.1 03/05/2011 2240   NEUTROABS 3.9 04/02/2010 1403   LYMPHSABS 2.1 04/02/2010 1403   MONOABS 0.8 04/02/2010 1403   EOSABS 0.1 04/02/2010 1403   BASOSABS 0.0 04/02/2010 1403      Lab 03/06/11 1055 03/05/11 2240  WBC -- 9.4  HGB 10.4* 13.0  HCT 31.1* 39.2  PLT -- 260  MCV -- 88.1  MCH -- 29.2  MCHC -- 33.2  RDW --  16.4*  LYMPHSABS -- --  MONOABS -- --  EOSABS -- --  BASOSABS -- --  BANDABS -- --    Lab 03/06/11 0546 03/06/11 0545 03/05/11 2240  NA -- -- 139  K 3.2* -- 2.5*  CL -- -- 105  CO2 -- -- 22  GLUCOSE -- -- 166*  BUN -- -- 21  CREATININE -- -- 1.25*  CALCIUM -- -- 8.3*  MG -- 1.9 --    Lab 03/05/11 2240  INR 1.12  PROTIME --   Cardiac markers: No results found for this basename: CK:3,CKMB:3,TROPONINI:3,MYOGLOBIN:3 in the last 168 hours No components found with this basename: POCBNP:3 No results found for this or any previous visit (from the past 240 hour(s)).  Studies/Results: Ct Abdomen Pelvis W Contrast  03/06/2011  *RADIOLOGY REPORT*  Clinical Data: Abdominal pain, bloody diarrhea  CT ABDOMEN AND PELVIS WITH CONTRAST  Technique:  Multidetector CT imaging of the abdomen and pelvis was performed following the standard protocol during bolus administration of intravenous contrast.  Contrast: 20mL OMNIPAQUE IOHEXOL 300 MG/ML IV SOLN, OMNIPAQUE IOHEXOL 300 MG/ML IV SOLN  Comparison: None.  Findings: Lung bases are unremarkable.  Sagittal images of the spine shows diffuse osteopenia.  Degenerative changes thoracolumbar spine.  Significant disc space flattening with vacuum disc phenomenon noted at the L4-L5 and L5-S1 level.  Extensive atherosclerotic calcifications of the abdominal aorta, bilateral proximal renal artery, SMA origin and bilateral iliac arteries. Enhanced liver is unremarkable.  There is somewhat heterogeneous gallbladder without calcified gallstones identified.  Enhanced pancreas, spleen and adrenal glands are unremarkable. Enhanced kidneys are symmetrical in size.  Mild lobulated renal contour and mild renal cortical thinning there is a probable cyst in lower pole of the right kidney measures 9 mm.  Probable cyst in lower pole of the left kidney measures 1.9 cm.  Small cyst mid pole of the left kidney measures 6 mm.  Probable cyst in mid pole of the right kidney  posterior aspect measures 1.2 cm.  No hydronephrosis or hydroureter.  No small bowel obstruction.  No ascites or free air.  No adenopathy.  There is no pericecal inflammation.  Normal appendix is clearly visualized in axial image 63.  Mild gaseous distention sigmoid colon.  Left colon and sigmoid colon diverticula are noted without evidence of acute diverticulitis.  Scattered diverticula are noted in the right colon and transverse colon.  The uterus is atrophic.  In axial image 84 there is nonspecific thickening of the anal wall. Measures at least 1.3 cm in thickness.  High density material probable blood products noted in the region of the anal lumen/anal sphincter. Measures about 1.8 cm.  Correlation with clinical exam is recommended to exclude bleeding hemorrhoids, bleeding inflammation /anal fissure or neoplastic process.  Delayed renal images shows bilateral renal excretion.  The bilateral visualized proximal ureter is unremarkable.  IMPRESSION:  1. In axial image 84 there is nonspecific thickening of the anal wall.  Measures at least 1.3 cm in thickness.  High density material probable blood products noted in the region of the anal lumen/anal sphincter. Measures about 1.8 cm.  Correlation with clinical exam is recommended to exclude bleeding hemorrhoids, bleeding inflammation /anal fissure or neoplastic process. 2.  Colonic diverticulosis without evidence of acute diverticulitis. 3.  No small bowel obstruction.  No ascites or free air. 4.  Bilateral probable renal cysts.  No hydronephrosis or hydroureter. 5.  No pericecal inflammation.  Normal appendix is clearly visualized. 6.  Extensive atherosclerotic vascular calcifications. 7. Degenerative changes thoracolumbar spine.  Original Report Authenticated By: Natasha Mead, M.D.    Medications: Scheduled Meds:   . diphenhydrAMINE  25 mg Intravenous Once   Continuous Infusions:   . 0.9 % NaCl with KCl 40 mEq / L 100 mL/hr at 03/06/11 1151  . pantoprozole  (PROTONIX) infusion 8 mg/hr (03/06/11 1151)   PRN Meds:.iohexol, iohexol, oxyCODONE  Assessment/Plan:   1. Rectal Bleeding- Follow up GI, Clear liquid diet, IVFs , IV Protonix infusion, Serial H/hs, and a type and screen has been sent.  2. Hypotension due to #1, Fluid rescusitation 3. Hypokalemia- replete 3. ABD Pain- Pain Control PRn  4. Hx of Mallory Weiss Tear- In past.      EDUCATION - test results and diagnostic studies were discussed with patient and pt's family who was present at the bedside - patient and family have verbalized the understanding - questions were answered at the bedside and contact information was provided for additional questions or concerns   LOS: 1 day   Mell Guia 03/06/2011, 3:46 PM  [facility] Pager: [phone]

## 2011-03-06 NOTE — ED Notes (Signed)
Pt in NAD at this time.  Resting comfortably, vital signs WNL.

## 2011-03-06 NOTE — H&P (Signed)
DATE OF ADMISSION:  03/06/2011  PCP:  Dr. Dorothyann Peng    Chief Complaint: Rectal Bleeding   HPI: [name] is an 77 y.o. female who presented to  The ED with complaints of sudden onset of crampy generalized abdominal pain followed by bright red blood per rectum X 5 episodes.  She state she has subsequent symptoms of SOB, weakness and dizziness.  EMS was called and when she arrived in the ED she was found to be hypotensive with systolic Blood Pressure of 66.  She was also found to have abnormal labs with a K+ level of 2.5.    Past Medical History:    Vitiligo    Mallory Weiss Tear in 2009  Past Surgical History:     Chest Tube placement for ?Pneumothorax or Empyema  Medications:  HOME MEDS: Prior to Admission medications   Medication Sig Start Date End Date Taking? Authorizing Provider  aspirin 81 MG chewable tablet Chew 81 mg by mouth daily.     Yes Historical Provider, MD    Allergies:  Allergies  Allergen Reactions  . Penicillins Other (See Comments)    unknown    Social History:   does not have a smoking history on file. She does not have any smokeless tobacco history on file. Her alcohol and drug histories not on file.  Family History: No family history on file.  Review of Systems:  The patient denies anorexia, fever, weight loss, vision loss, decreased hearing, hoarseness, chest pain, syncope,  peripheral edema, balance deficits, hemoptysis, severe indigestion/heartburn, hematuria, incontinence, genital sores,  suspicious skin lesions, transient blindness, difficulty walking, depression, unusual weight change, enlarged lymph nodes, angioedema, and breast masses.   Physical Exam:  GEN:  Pleasant  77 year old elderly well nourished well developed African American female examined  and in no acute distress; cooperative with exam Filed Vitals:   03/06/11 0230 03/06/11 0330 03/06/11 0430 03/06/11 0530  BP: 155/83 154/85 125/82 122/69  Pulse: 71 70 67 68    Temp: 97.7 F (36.5 C) 98 F (36.7 C) 97.8 F (36.6 C) 98.3 F (36.8 C)  TempSrc:      Resp: 17 18 16 16   SpO2:   100% 100%   Blood pressure 122/69, pulse 68, temperature 98.3 F (36.8 C), temperature source Oral, resp. rate 16, SpO2 100.00%. PSYCH: SHe is alert and oriented x4; does not appear anxious does not appear depressed; affect is normal HEENT: Normocephalic and Atraumatic, Mucous membranes pink; PERRLA; EOM intact; Fundi:  Benign;  No scleral icterus, Nares: Patent, Oropharynx: Clear, Neck:  FROM, no cervical lymphadenopathy nor thyromegaly or carotid bruit; no JVD; Breasts:: Not examined CHEST WALL: No tenderness CHEST: Normal respiration, clear to auscultation bilaterally HEART: Regular rate and rhythm; no murmurs rubs or gallops BACK: No kyphosis or scoliosis; no CVA tenderness ABDOMEN: Positive Bowel Sounds, soft non-tender; no masses, no organomegaly. Rectal Exam: Not done EXTREMITIES: No bone or joint deformity; age-appropriate arthropathy of the hands and knees; no cyanosis, clubbing or edema; no ulcerations. Genitalia: not examined PULSES: 2+ and symmetric SKIN: Normal hydration no rash or ulceration,  Discrete hypopigmentated areas at the periorbital areas bilaterally  CNS: Cranial nerves 2-12 grossly intact no focal neurologic deficit   Labs & Imaging Results for orders placed during the hospital encounter of 03/05/11 (from the past 48 hour(s))  CBC     Status: Abnormal   Collection Time   03/05/11 10:40 PM      Component Value Range Comment  WBC 9.4  4.0 - 10.5 (K/uL)    RBC 4.45  3.87 - 5.11 (MIL/uL)    Hemoglobin 13.0  12.0 - 15.0 (g/dL)    HCT 46.9  62.9 - 52.8 (%)    MCV 88.1  78.0 - 100.0 (fL)    MCH 29.2  26.0 - 34.0 (pg)    MCHC 33.2  30.0 - 36.0 (g/dL)    RDW 41.3 (*) 24.4 - 15.5 (%)    Platelets 260  150 - 400 (K/uL)   PROTIME-INR     Status: Normal   Collection Time   03/05/11 10:40 PM      Component Value Range Comment   Prothrombin Time  14.6  11.6 - 15.2 (seconds)    INR 1.12  0.00 - 1.49    APTT     Status: Normal   Collection Time   03/05/11 10:40 PM      Component Value Range Comment   aPTT 26  24 - 37 (seconds)   HEPATIC FUNCTION PANEL     Status: Abnormal   Collection Time   03/05/11 10:40 PM      Component Value Range Comment   Total Protein 5.4 (*) 6.0 - 8.3 (g/dL)    Albumin 2.5 (*) 3.5 - 5.2 (g/dL)    AST 11  0 - 37 (U/L)    ALT 8  0 - 35 (U/L)    Alkaline Phosphatase 61  39 - 117 (U/L)    Total Bilirubin 0.3  0.3 - 1.2 (mg/dL)    Bilirubin, Direct <0.1  0.0 - 0.3 (mg/dL)    Indirect Bilirubin NOT CALCULATED  0.3 - 0.9 (mg/dL)   BASIC METABOLIC PANEL     Status: Abnormal   Collection Time   03/05/11 10:40 PM      Component Value Range Comment   Sodium 139  135 - 145 (mEq/L)    Potassium 2.5 (*) 3.5 - 5.1 (mEq/L)    Chloride 105  96 - 112 (mEq/L)    CO2 22  19 - 32 (mEq/L)    Glucose, Bld 166 (*) 70 - 99 (mg/dL)    BUN 21  6 - 23 (mg/dL)    Creatinine, Ser 0.27 (*) 0.50 - 1.10 (mg/dL)    Calcium 8.3 (*) 8.4 - 10.5 (mg/dL)    GFR calc non Af Amer 40 (*) >90 (mL/min)    GFR calc Af Amer 46 (*) >90 (mL/min)   PREPARE RBC (CROSSMATCH)     Status: Normal   Collection Time   03/05/11 10:50 PM      Component Value Range Comment   Order Confirmation ORDER PROCESSED BY BLOOD BANK     TYPE AND SCREEN     Status: Normal (Preliminary result)   Collection Time   03/05/11 10:50 PM      Component Value Range Comment   ABO/RH(D) O POS      Antibody Screen NEG      Sample Expiration 03/08/2011      Unit Number [id number]      Blood Component Type RED CELLS,LR      Unit division 00      Status of Unit ISSUED      Transfusion Status OK TO TRANSFUSE      Crossmatch Result Compatible          Assessment/Plan: 1.  Rectal Bleeding- GI to see this AM (Dr Lynett Fish), Clear liquid diet, IVFs , IV Protonix infusion, Serial H/hs, and a type and screen  has been sent.   2.  Hypotension due to #1, Fluid  rescusitation, admit to [facility] Unit for monitoring.   3.  Hypokalemia- Replete K+, and check Magnesium replace if needed.   3.  ABD Pain- Pain Control PRn 4.  Hx of Mallory Weiss Tear-  In past.       Other plans as per orders.    CODE STATUS:      FULL CODE        Syan Cullimore C 03/06/2011, 6:21 AM

## 2011-03-06 NOTE — ED Notes (Signed)
Assisted pt to bedside commode, pt SOB.  O2 sats remain 100% on RA.

## 2011-03-06 NOTE — ED Notes (Signed)
Intermittently dropping O2 sat briefly to 77% on room air; increased to 100% with deep breathing. Placed on [place] at 2l/min with O2 maintaining at 100% .

## 2011-03-07 ENCOUNTER — Encounter (HOSPITAL_COMMUNITY): Payer: Self-pay

## 2011-03-07 ENCOUNTER — Inpatient Hospital Stay (HOSPITAL_COMMUNITY): Payer: Medicare Other

## 2011-03-07 DIAGNOSIS — D62 Acute posthemorrhagic anemia: Secondary | ICD-10-CM | POA: Diagnosis present

## 2011-03-07 DIAGNOSIS — I959 Hypotension, unspecified: Secondary | ICD-10-CM

## 2011-03-07 DIAGNOSIS — N179 Acute kidney failure, unspecified: Secondary | ICD-10-CM | POA: Diagnosis present

## 2011-03-07 DIAGNOSIS — K5730 Diverticulosis of large intestine without perforation or abscess without bleeding: Secondary | ICD-10-CM

## 2011-03-07 DIAGNOSIS — K921 Melena: Secondary | ICD-10-CM | POA: Diagnosis present

## 2011-03-07 DIAGNOSIS — I10 Essential (primary) hypertension: Secondary | ICD-10-CM | POA: Diagnosis present

## 2011-03-07 DIAGNOSIS — K922 Gastrointestinal hemorrhage, unspecified: Secondary | ICD-10-CM

## 2011-03-07 DIAGNOSIS — R1084 Generalized abdominal pain: Secondary | ICD-10-CM

## 2011-03-07 LAB — BASIC METABOLIC PANEL
BUN: 14 mg/dL (ref 6–23)
CO2: 21 mEq/L (ref 19–32)
Calcium: 7.8 mg/dL — ABNORMAL LOW (ref 8.4–10.5)
Chloride: 116 mEq/L — ABNORMAL HIGH (ref 96–112)
Creatinine, Ser: 0.92 mg/dL (ref 0.50–1.10)
Glucose, Bld: 91 mg/dL (ref 70–99)

## 2011-03-07 LAB — CBC
HCT: 18.9 % — ABNORMAL LOW (ref 36.0–46.0)
MCH: 28.8 pg (ref 26.0–34.0)
MCV: 86.3 fL (ref 78.0–100.0)
Platelets: 162 10*3/uL (ref 150–400)
RDW: 17.4 % — ABNORMAL HIGH (ref 11.5–15.5)
WBC: 8.1 10*3/uL (ref 4.0–10.5)

## 2011-03-07 LAB — HEMOGLOBIN AND HEMATOCRIT, BLOOD: HCT: 22.1 % — ABNORMAL LOW (ref 36.0–46.0)

## 2011-03-07 MED ORDER — PEG-KCL-NACL-NASULF-NA ASC-C 100 G PO SOLR
1.0000 | Freq: Once | ORAL | Status: AC | PRN
  Filled 2011-03-07: qty 1

## 2011-03-07 MED ORDER — TECHNETIUM TC 99M-LABELED RED BLOOD CELLS IV KIT
25.0000 | PACK | Freq: Once | INTRAVENOUS | Status: AC | PRN
  Administered 2011-03-07: 25 via INTRAVENOUS

## 2011-03-07 MED ORDER — PEG-KCL-NACL-NASULF-NA ASC-C 100 G PO SOLR
1.0000 | Freq: Once | ORAL | Status: AC
  Administered 2011-03-08: 100 g via ORAL
  Filled 2011-03-07: qty 1

## 2011-03-07 MED ORDER — IPRATROPIUM BROMIDE 0.02 % IN SOLN: 0.5000 mg | Freq: Four times a day (QID) | RESPIRATORY_TRACT | Status: DC | PRN

## 2011-03-07 MED ORDER — ALBUTEROL SULFATE HFA 108 (90 BASE) MCG/ACT IN AERS
1.0000 | INHALATION_SPRAY | RESPIRATORY_TRACT | Status: DC | PRN
  Administered 2011-03-08: 1 via RESPIRATORY_TRACT
  Filled 2011-03-07 (×2): qty 6.7

## 2011-03-07 NOTE — Consult Note (Signed)
[facility] Progress Note  SUBJECTIVE: Continues to have abdominal pain and hematochezia.  OBJECTIVE:  Vital signs in last 24 hours: Temp:  [97.7 F (36.5 C)-98.9 F (37.2 C)] 98.5 F (36.9 C) (12/30 1245) Pulse Rate:  [67-82] 82  (12/30 1211) Resp:  [16-28] 28  (12/30 1211) BP: (81-158)/(38-93) 124/73 mmHg (12/30 1245) SpO2:  [100 %] 100 % (12/30 1211) Weight:  [76.1 kg (167 lb 12.3 oz)] 167 lb 12.3 oz (76.1 kg) (12/29 1736) Last BM Date: 03/06/11 General:   Well-developed black female in NAD Heart:  Occasional irregular beat. Lungs: No wheezes heard. Breathing slightly labored. Abdomen:  Soft, nontender and nondistended. Normal bowel sounds. Extremities:  Without edema. Neurologic:  Alert and  oriented x4;  grossly normal neurologically. Psych:  Pleasant.  Normal mood and affect.  Lab Results:  Basename 03/07/11 0515 03/06/11 2341 03/06/11 1552 03/05/11 2240  WBC 8.1 -- -- 9.4  HGB 6.3* 7.4* 9.1* --  HCT 18.9* 22.1* 27.4* --  PLT 162 -- -- 260   BMET  Basename 03/07/11 0515 03/06/11 0546 03/05/11 2240  NA 142 -- 139  K 4.4 3.2* 2.5*  CL 116* -- 105  CO2 21 -- 22  GLUCOSE 91 -- 166*  BUN 14 -- 21  CREATININE 0.92 -- 1.25*  CALCIUM 7.8* -- 8.3*   LFT  Basename 03/05/11 2240  PROT 5.4*  ALBUMIN 2.5*  AST 11  ALT 8  ALKPHOS 61  BILITOT 0.3  BILIDIR <0.1  IBILI NOT CALCULATED   PT/INR  Basename 03/05/11 2240  LABPROT 14.6  INR 1.12    Studies/Results: Ct Abdomen Pelvis W Contrast  03/06/2011  *RADIOLOGY REPORT*  Clinical Data: Abdominal pain, bloody diarrhea  CT ABDOMEN AND PELVIS WITH CONTRAST  Technique:  Multidetector CT imaging of the abdomen and pelvis was performed following the standard protocol during bolus administration of intravenous contrast.  Contrast: 20mL OMNIPAQUE IOHEXOL 300 MG/ML IV SOLN, OMNIPAQUE IOHEXOL 300 MG/ML IV SOLN  Comparison: None.  Findings: Lung bases are unremarkable.  Sagittal images of the spine shows  diffuse osteopenia.  Degenerative changes thoracolumbar spine.  Significant disc space flattening with vacuum disc phenomenon noted at the L4-L5 and L5-S1 level.  Extensive atherosclerotic calcifications of the abdominal aorta, bilateral proximal renal artery, SMA origin and bilateral iliac arteries. Enhanced liver is unremarkable.  There is somewhat heterogeneous gallbladder without calcified gallstones identified.  Enhanced pancreas, spleen and adrenal glands are unremarkable. Enhanced kidneys are symmetrical in size.  Mild lobulated renal contour and mild renal cortical thinning there is a probable cyst in lower pole of the right kidney measures 9 mm.  Probable cyst in lower pole of the left kidney measures 1.9 cm.  Small cyst mid pole of the left kidney measures 6 mm.  Probable cyst in mid pole of the right kidney posterior aspect measures 1.2 cm.  No hydronephrosis or hydroureter.  No small bowel obstruction.  No ascites or free air.  No adenopathy.  There is no pericecal inflammation.  Normal appendix is clearly visualized in axial image 63.  Mild gaseous distention sigmoid colon.  Left colon and sigmoid colon diverticula are noted without evidence of acute diverticulitis.  Scattered diverticula are noted in the right colon and transverse colon.  The uterus is atrophic.  In axial image 84 there is nonspecific thickening of the anal wall. Measures at least 1.3 cm in thickness.  High density material probable blood products noted in the region of the anal lumen/anal sphincter. Measures about  1.8 cm.  Correlation with clinical exam is recommended to exclude bleeding hemorrhoids, bleeding inflammation /anal fissure or neoplastic process.  Delayed renal images shows bilateral renal excretion.  The bilateral visualized proximal ureter is unremarkable.  IMPRESSION:  1. In axial image 84 there is nonspecific thickening of the anal wall.  Measures at least 1.3 cm in thickness.  High density material probable blood  products noted in the region of the anal lumen/anal sphincter. Measures about 1.8 cm.  Correlation with clinical exam is recommended to exclude bleeding hemorrhoids, bleeding inflammation /anal fissure or neoplastic process. 2.  Colonic diverticulosis without evidence of acute diverticulitis. 3.  No small bowel obstruction.  No ascites or free air. 4.  Bilateral probable renal cysts.  No hydronephrosis or hydroureter. 5.  No pericecal inflammation.  Normal appendix is clearly visualized. 6.  Extensive atherosclerotic vascular calcifications. 7. Degenerative changes thoracolumbar spine.  Original Report Authenticated By: Natasha Mead, M.D.     ASSESSMENT / PLAN: 101.  77 year old female with abdominal pain and hematochezia. Given pain would suspect ischemia but contrast CTscan not suggestive of that. Infectious process? Though wouldn't explain abdominal pain, bleeding could be diverticular or secondary to an AVM. Neoplasm not excluded. There is an abnormality involving anus on CTscan which may or may not be clinically relevant. For further evaluation will proceed with colonoscopy in the am.  If bleeding doesn't slow she may need tagged RBC scan or arteriogram.  2. Anemia of acute blood loss. Her hemoglobin is down from 10.4 yesterday to 6.3. Two units of PRBC was already ordered and in progress.    LOS: 2 days   Willette Cluster  03/07/2011, 1:16 PM

## 2011-03-07 NOTE — Progress Notes (Signed)
Patient ID: [name], female   DOB: 04/14/1929, 77 y.o.   MRN: [id number] Subjective: No events overnight. Patient denies chest pain, shortness of breath, abdominal pain. Objective:  Vital signs in last 24 hours:  Filed Vitals:   03/07/11 1145 03/07/11 1200 03/07/11 1211 03/07/11 1245  BP: 117/56  117/56 124/73  Pulse:   82   Temp: 97.8 F (36.6 C) 97.8 F (36.6 C) 98.2 F (36.8 C) 98.5 F (36.9 C)  TempSrc: Oral Oral Oral Oral  Resp:   28   Height:      Weight:      SpO2:   100%     Intake/Output from previous day:   Intake/Output Summary (Last 24 hours) at 03/07/11 1421 Last data filed at 03/07/11 1300  Gross per 24 hour  Intake 3607.5 ml  Output    350 ml  Net 3257.5 ml    Physical Exam: General: Alert, awake, oriented x3, in no acute distress. HEENT: No bruits, no goiter. Moist mucous membranes, no scleral icterus, no conjunctival pallor. Heart: Regular rate and rhythm, S1/S2 +, no murmurs, rubs, gallops. Lungs: Clear to auscultation bilaterally. No wheezing, no rhonchi, no rales.  Abdomen: Soft, nontender, nondistended, positive bowel sounds. Extremities: No clubbing or cyanosis, no pitting edema,  positive pedal pulses. Neuro: Grossly nonfocal.  Lab Results:  Basic Metabolic Panel:    Component Value Date/Time   NA 142 03/07/2011 0515   K 4.4 03/07/2011 0515   CL 116* 03/07/2011 0515   CO2 21 03/07/2011 0515   BUN 14 03/07/2011 0515   CREATININE 0.92 03/07/2011 0515   GLUCOSE 91 03/07/2011 0515   CALCIUM 7.8* 03/07/2011 0515   CBC:    Component Value Date/Time   WBC 8.1 03/07/2011 0515   HGB 6.3* 03/07/2011 0515   HCT 18.9* 03/07/2011 0515   PLT 162 03/07/2011 0515   MCV 86.3 03/07/2011 0515   NEUTROABS 3.9 04/02/2010 1403   LYMPHSABS 2.1 04/02/2010 1403   MONOABS 0.8 04/02/2010 1403   EOSABS 0.1 04/02/2010 1403   BASOSABS 0.0 04/02/2010 1403      Lab 03/07/11 0515 03/06/11 2341 03/06/11 1552 03/06/11 1055 03/05/11 2240  WBC 8.1 -- --  -- 9.4  HGB 6.3* 7.4* 9.1* 10.4* 13.0  HCT 18.9* 22.1* 27.4* 31.1* 39.2  PLT 162 -- -- -- 260  MCV 86.3 -- -- -- 88.1  MCH 28.8 -- -- -- 29.2  MCHC 33.3 -- -- -- 33.2  RDW 17.4* -- -- -- 16.4*  LYMPHSABS -- -- -- -- --  MONOABS -- -- -- -- --  EOSABS -- -- -- -- --  BASOSABS -- -- -- -- --  BANDABS -- -- -- -- --    Lab 03/07/11 0515 03/06/11 0546 03/06/11 0545 03/05/11 2240  NA 142 -- -- 139  K 4.4 3.2* -- 2.5*  CL 116* -- -- 105  CO2 21 -- -- 22  GLUCOSE 91 -- -- 166*  BUN 14 -- -- 21  CREATININE 0.92 -- -- 1.25*  CALCIUM 7.8* -- -- 8.3*  MG -- -- 1.9 --    Lab 03/05/11 2240  INR 1.12  PROTIME --   Cardiac markers: No results found for this basename: CK:3,CKMB:3,TROPONINI:3,MYOGLOBIN:3 in the last 168 hours No components found with this basename: POCBNP:3 Recent Results (from the past 240 hour(s))  MRSA PCR SCREENING     Status: Normal   Collection Time   03/06/11  6:24 PM      Component Value Range  Status Comment   MRSA by PCR NEGATIVE  NEGATIVE  Final     Studies/Results: Ct Abdomen Pelvis W Contrast  03/06/2011  *RADIOLOGY REPORT*  Clinical Data: Abdominal pain, bloody diarrhea  CT ABDOMEN AND PELVIS WITH CONTRAST  Technique:  Multidetector CT imaging of the abdomen and pelvis was performed following the standard protocol during bolus administration of intravenous contrast.  Contrast: 20mL OMNIPAQUE IOHEXOL 300 MG/ML IV SOLN, OMNIPAQUE IOHEXOL 300 MG/ML IV SOLN  Comparison: None.  Findings: Lung bases are unremarkable.  Sagittal images of the spine shows diffuse osteopenia.  Degenerative changes thoracolumbar spine.  Significant disc space flattening with vacuum disc phenomenon noted at the L4-L5 and L5-S1 level.  Extensive atherosclerotic calcifications of the abdominal aorta, bilateral proximal renal artery, SMA origin and bilateral iliac arteries. Enhanced liver is unremarkable.  There is somewhat heterogeneous gallbladder without calcified gallstones  identified.  Enhanced pancreas, spleen and adrenal glands are unremarkable. Enhanced kidneys are symmetrical in size.  Mild lobulated renal contour and mild renal cortical thinning there is a probable cyst in lower pole of the right kidney measures 9 mm.  Probable cyst in lower pole of the left kidney measures 1.9 cm.  Small cyst mid pole of the left kidney measures 6 mm.  Probable cyst in mid pole of the right kidney posterior aspect measures 1.2 cm.  No hydronephrosis or hydroureter.  No small bowel obstruction.  No ascites or free air.  No adenopathy.  There is no pericecal inflammation.  Normal appendix is clearly visualized in axial image 63.  Mild gaseous distention sigmoid colon.  Left colon and sigmoid colon diverticula are noted without evidence of acute diverticulitis.  Scattered diverticula are noted in the right colon and transverse colon.  The uterus is atrophic.  In axial image 84 there is nonspecific thickening of the anal wall. Measures at least 1.3 cm in thickness.  High density material probable blood products noted in the region of the anal lumen/anal sphincter. Measures about 1.8 cm.  Correlation with clinical exam is recommended to exclude bleeding hemorrhoids, bleeding inflammation /anal fissure or neoplastic process.  Delayed renal images shows bilateral renal excretion.  The bilateral visualized proximal ureter is unremarkable.  IMPRESSION:  1. In axial image 84 there is nonspecific thickening of the anal wall.  Measures at least 1.3 cm in thickness.  High density material probable blood products noted in the region of the anal lumen/anal sphincter. Measures about 1.8 cm.  Correlation with clinical exam is recommended to exclude bleeding hemorrhoids, bleeding inflammation /anal fissure or neoplastic process. 2.  Colonic diverticulosis without evidence of acute diverticulitis. 3.  No small bowel obstruction.  No ascites or free air. 4.  Bilateral probable renal cysts.  No hydronephrosis or  hydroureter. 5.  No pericecal inflammation.  Normal appendix is clearly visualized. 6.  Extensive atherosclerotic vascular calcifications. 7. Degenerative changes thoracolumbar spine.  Original Report Authenticated By: Natasha Mead, M.D.    Medications: Scheduled Meds:   . peg 3350 powder  1 kit Oral Once  . potassium chloride  40 mEq Oral Once   Continuous Infusions:   . 0.9 % NaCl with KCl 40 mEq / L 100 mL/hr at 03/06/11 2350  . pantoprozole (PROTONIX) infusion 8 mg/hr (03/07/11 1010)   PRN Meds:.acetaminophen, acetaminophen, albuterol, alum & mag hydroxide-simeth, HYDROmorphone, iohexol, iohexol, ipratropium, ondansetron (ZOFRAN) IV, ondansetron, oxyCODONE, peg 3350 powder, zolpidem  Assessment/Plan:  Principal Problem:   *Anemia due to blood loss, acute - as per GI colonoscopy  in am - not entirely clear the cause of blood loss - patient is receiving 2nd unit of PRBCs, we will transfuse 2 more units of PRBCs - monitor hemoglobin ever 8 hours - keep NPO after midnight for colonoscopy in am  Active Problems:   Hematochezia - colonoscopy in am -- possible diverticular bleed versus AVM   GERD - continue protonix   Acute kidney injury - likely prerenal - resolved   HTN (hypertension) - at goal   EDUCATION - test results and diagnostic studies were discussed with patient and pt's family who was present at the bedside - patient and family have verbalized the understanding - questions were answered at the bedside and contact information was provided for additional questions or concerns   LOS: 2 days   Gerry Blanchfield 03/07/2011, 2:21 PM  [facility] Pager: [phone]

## 2011-03-07 NOTE — Progress Notes (Signed)
Pt has SOB and wheezing upon exertion and to bedside commode; oxygen is 100% on 2L nasal cannula; MD notified; no new orders received; will continue to monitor

## 2011-03-07 NOTE — Consult Note (Signed)
I have taken an interval history, reviewed the chart and examined the patient. I agree with the extender's note, impression and recommendations.   Cuinn Westerhold T. Reanne Nellums MD FACG 

## 2011-03-07 NOTE — Progress Notes (Signed)
CRITICAL VALUE ALERT  Critical value received:  Hgb 6.3  Date of notification:  03/07/2011  Time of notification:  0620  Critical value read back:yes  Nurse who received alert:  B. Katrinka Blazing  MD notified (1st page): M. Lynch  Time of first page:  0620  MD notified (2nd page):  Time of second page:  Responding MD:    Time MD responded:

## 2011-03-08 ENCOUNTER — Encounter (HOSPITAL_COMMUNITY)
Admission: EM | Disposition: A | Discharge status: Home / Self Care | Payer: Self-pay | Source: Home / Self Care | Attending: Internal Medicine

## 2011-03-08 ENCOUNTER — Encounter (HOSPITAL_COMMUNITY): Payer: Self-pay

## 2011-03-08 DIAGNOSIS — K5730 Diverticulosis of large intestine without perforation or abscess without bleeding: Secondary | ICD-10-CM

## 2011-03-08 DIAGNOSIS — K922 Gastrointestinal hemorrhage, unspecified: Secondary | ICD-10-CM

## 2011-03-08 HISTORY — PX: COLONOSCOPY: SHX5424

## 2011-03-08 LAB — HEMOGLOBIN AND HEMATOCRIT, BLOOD
HCT: 26.7 % — ABNORMAL LOW (ref 36.0–46.0)
HCT: 23.9 % — ABNORMAL LOW (ref 36.0–46.0)
Hemoglobin: 9.1 g/dL — ABNORMAL LOW (ref 12.0–15.0)

## 2011-03-08 LAB — TYPE AND SCREEN
Unit division: 0
Unit division: 0

## 2011-03-08 LAB — CBC
Hemoglobin: 8.3 g/dL — ABNORMAL LOW (ref 12.0–15.0)
MCH: 29.4 pg (ref 26.0–34.0)
MCV: 85.8 fL (ref 78.0–100.0)
Platelets: 137 10*3/uL — ABNORMAL LOW (ref 150–400)
Platelets: 156 10*3/uL (ref 150–400)
RBC: 2.82 MIL/uL — ABNORMAL LOW (ref 3.87–5.11)
RBC: 2.74 MIL/uL — ABNORMAL LOW (ref 3.87–5.11)
RDW: 16.2 % — ABNORMAL HIGH (ref 11.5–15.5)
WBC: 10.9 10*3/uL — ABNORMAL HIGH (ref 4.0–10.5)
WBC: 10.8 10*3/uL — ABNORMAL HIGH (ref 4.0–10.5)

## 2011-03-08 LAB — BASIC METABOLIC PANEL
CO2: 22 mEq/L (ref 19–32)
Chloride: 117 mEq/L — ABNORMAL HIGH (ref 96–112)
Creatinine, Ser: 0.89 mg/dL (ref 0.50–1.10)
Glucose, Bld: 84 mg/dL (ref 70–99)
Sodium: 144 mEq/L (ref 135–145)

## 2011-03-08 SURGERY — COLONOSCOPY
Anesthesia: Moderate Sedation

## 2011-03-08 MED ORDER — FENTANYL CITRATE 0.05 MG/ML IJ SOLN
INTRAMUSCULAR | Status: DC | PRN
  Administered 2011-03-08 (×2): 25 ug via INTRAVENOUS

## 2011-03-08 MED ORDER — FENTANYL CITRATE 0.05 MG/ML IJ SOLN
INTRAMUSCULAR | Status: AC
  Filled 2011-03-08: qty 2

## 2011-03-08 MED ORDER — MIDAZOLAM HCL 10 MG/2ML IJ SOLN
INTRAMUSCULAR | Status: AC
  Filled 2011-03-08: qty 2

## 2011-03-08 MED ORDER — SODIUM CHLORIDE 0.45 % IV SOLN: Freq: Once | INTRAVENOUS | Status: DC

## 2011-03-08 MED ORDER — MIDAZOLAM HCL 10 MG/2ML IJ SOLN
INTRAMUSCULAR | Status: DC | PRN
  Administered 2011-03-08: 1 mg via INTRAVENOUS
  Administered 2011-03-08: 2 mg via INTRAVENOUS

## 2011-03-08 NOTE — Progress Notes (Signed)
Patient ID: [name], female   DOB: 1930-02-03, 77 y.o.   MRN: [id number] Subjective: No events overnight.   Objective:  Vital signs in last 24 hours:  Filed Vitals:   03/08/11 1015 03/08/11 1020 03/08/11 1025 03/08/11 1200  BP: 134/66 131/45 131/45 132/44  Pulse:    78  Temp:    98.6 F (37 C)  TempSrc:    Oral  Resp: 18 19 21 17   Height:      Weight:      SpO2: 100% 100% 100% 100%    Intake/Output from previous day:   Intake/Output Summary (Last 24 hours) at 03/08/11 1612 Last data filed at 03/08/11 1500  Gross per 24 hour  Intake   2665 ml  Output    900 ml  Net   1765 ml    Physical Exam: General: Alert, awake, oriented x3, in no acute distress. HEENT: No bruits, no goiter. Moist mucous membranes, no scleral icterus, no conjunctival pallor. Heart: Regular rate and rhythm, S1/S2 +, no murmurs, rubs, gallops. Lungs: Clear to auscultation bilaterally. No wheezing, no rhonchi, no rales.  Abdomen: Soft, nontender, nondistended, positive bowel sounds. Extremities: No clubbing or cyanosis, no pitting edema,  positive pedal pulses. Neuro: Grossly nonfocal.  Lab Results:  Basic Metabolic Panel:    Component Value Date/Time   NA 144 03/08/2011 0515   K 4.2 03/08/2011 0515   CL 117* 03/08/2011 0515   CO2 22 03/08/2011 0515   BUN 11 03/08/2011 0515   CREATININE 0.89 03/08/2011 0515   GLUCOSE 84 03/08/2011 0515   CALCIUM 8.0* 03/08/2011 0515   CBC:    Component Value Date/Time   WBC 10.9* 03/08/2011 0515   HGB 8.3* 03/08/2011 0515   HCT 24.2* 03/08/2011 0515   PLT 137* 03/08/2011 0515   MCV 85.8 03/08/2011 0515   NEUTROABS 3.9 04/02/2010 1403   LYMPHSABS 2.1 04/02/2010 1403   MONOABS 0.8 04/02/2010 1403   EOSABS 0.1 04/02/2010 1403   BASOSABS 0.0 04/02/2010 1403      Lab 03/08/11 0515 03/08/11 0153 03/07/11 0515 03/06/11 2341 03/06/11 1552 03/05/11 2240  WBC 10.9* -- 8.1 -- -- 9.4  HGB 8.3* 9.1* 6.3* 7.4* 9.1* --  HCT 24.2* 26.7* 18.9* 22.1* 27.4* --   PLT 137* -- 162 -- -- 260  MCV 85.8 -- 86.3 -- -- 88.1  MCH 29.4 -- 28.8 -- -- 29.2  MCHC 34.3 -- 33.3 -- -- 33.2  RDW 15.8* -- 17.4* -- -- 16.4*  LYMPHSABS -- -- -- -- -- --  MONOABS -- -- -- -- -- --  EOSABS -- -- -- -- -- --  BASOSABS -- -- -- -- -- --  BANDABS -- -- -- -- -- --    Lab 03/08/11 0515 03/07/11 0515 03/06/11 0546 03/06/11 0545 03/05/11 2240  NA 144 142 -- -- 139  K 4.2 4.4 3.2* -- 2.5*  CL 117* 116* -- -- 105  CO2 22 21 -- -- 22  GLUCOSE 84 91 -- -- 166*  BUN 11 14 -- -- 21  CREATININE 0.89 0.92 -- -- 1.25*  CALCIUM 8.0* 7.8* -- -- 8.3*  MG -- -- -- 1.9 --    Lab 03/05/11 2240  INR 1.12  PROTIME --   Cardiac markers: No results found for this basename: CK:3,CKMB:3,TROPONINI:3,MYOGLOBIN:3 in the last 168 hours No components found with this basename: POCBNP:3 Recent Results (from the past 240 hour(s))  MRSA PCR SCREENING     Status: Normal   Collection Time  03/06/11  6:24 PM      Component Value Range Status Comment   MRSA by PCR NEGATIVE  NEGATIVE  Final     Studies/Results: Nm Gi Blood Loss  03/07/2011  *RADIOLOGY REPORT*  Clinical Data: Hematochezia and anemia requiring transfusion.  NUCLEAR MEDICINE GASTROINTESTINAL BLEEDING STUDY  Technique:  Sequential abdominal images were obtained following intravenous administration of Tc-44m labeled red blood cells.  Radiopharmaceutical: 25 mCi technetium 28m labeled red blood cells.  Comparison: CT of the abdomen pelvis dated 03/06/2011.  Findings: The first hour of imaging was negative for GI bleed. During a second hour of imaging, abnormal activity was localized to the region of the splenic flexure or descending colon.  There was transit of activity in both antegrade and retrograde directions.  IMPRESSION: Positive bleeding study during second hour of imaging with abnormal bleeding localizing to the colon at the level of the splenic flexure or descending colon.  Original Report Authenticated By: Reola Calkins, M.D.    Medications: Scheduled Meds:   . sodium chloride   Intravenous Once  . peg 3350 powder  1 kit Oral Once   Continuous Infusions:   . 0.9 % NaCl with KCl 40 mEq / L 100 mL/hr at 03/08/11 1500  . pantoprozole (PROTONIX) infusion 8 mg/hr (03/08/11 1245)   PRN Meds:.acetaminophen, acetaminophen, albuterol, alum & mag hydroxide-simeth, HYDROmorphone, ipratropium, ondansetron (ZOFRAN) IV, ondansetron, oxyCODONE, peg 3350 powder, zolpidem, DISCONTD: fentaNYL, DISCONTD: midazolam  Assessment/Plan:   Principal Problem:   *Anemia due to blood loss, acute  - as per GI colonoscopy severe diverticulosis and some dilute blood in left colon - will keep Hgb >8.0 - GI is following  Active Problems:   Hematochezia  - possible diverticular bleed - GI is following  GERD  - continue protonix   Acute kidney injury  - likely prerenal  - resolved   HTN (hypertension)  - at goal   EDUCATION  - test results and diagnostic studies were discussed with patient and pt's family who was present at the bedside  - patient and family have verbalized the understanding  - questions were answered at the bedside and contact information was provided for additional questions or concerns      LOS: 3 days   Sedric Guia 03/08/2011, 4:12 PM  [facility] Pager: [phone]

## 2011-03-08 NOTE — Op Note (Signed)
[facility] [address] [place], [place]  [address]  COLONOSCOPY PROCEDURE REPORT  PATIENT:  Annis, Lagoy  MR#:  [id number] BIRTHDATE:  20-Jan-1930, 81 yrs. old  GENDER:  female ENDOSCOPIST:  Carie Caddy. Kearsten Ginther, MD REF. BY:  [facility] PROCEDURE DATE:  03/08/2011 PROCEDURE:  Diagnostic Colonoscopy ASA CLASS:  Class II INDICATIONS:  hematochezia, Anemia MEDICATIONS:   These medications were titrated to patient response per physician's verbal order, Versed 3 mg IV, Fentanyl 50 mcg IV  DESCRIPTION OF PROCEDURE:   After the risks benefits and alternatives of the procedure were thoroughly explained, informed consent was obtained.  Digital rectal exam was performed and revealed no rectal masses.   The Pentax Colonoscope [id number] and [id number]) endoscope was introduced through the anus and advanced to the terminal ileum which was intubated for a short distance, without limitations.  The quality of the prep was adequate, using MoviPrep.  The instrument was then slowly withdrawn as the colon was fully examined. <<PROCEDUREIMAGES>>  FINDINGS:  The terminal ileum appeared normal with no evidence of blood..  Severe diverticulosis was found transverse to sigmoid colon, being most dense in the descending and sigmoid colon. Dilute blood/heme was found in the left colon with no active hemorrhage found despite close inspection of diverticular orifices with copious irrigation and lavage. Retroflexed views in the rectum revealed no abnormalities.  The scope was then withdrawn from the cecum and the procedure completed.  COMPLICATIONS:  None  ENDOSCOPIC IMPRESSION: 1) Normal terminal ileum with no evidence of blood/heme. 2) Severe diverticulosis in the transverse to sigmoid without active hemorrhage. 3) Dilute blood in the left colon with no active bleeding found felt secondary to diverticular hemorrhage.  RECOMMENDATIONS: 1) Avoid all NSAIDs for now. 2)  Monitor serial CBC's to ensure stabilization.  Would target to maintain Hgb > 8.0 3) If active bleeding recurs, then would contact IR for angiography +/- embolization given the high likelihood of diverticular hemorrhage (based on recent positive tagged RBC study in left colon and today's findings at colonoscopy).  Carie Caddy. Eydan Chianese, MD  CC:  The Patient  n. eSIGNED:   Carie Caddy. Adrea Sherpa at 03/08/2011 09:47 AM  [place]orek, Eber Jones, [phone]

## 2011-03-09 DIAGNOSIS — K922 Gastrointestinal hemorrhage, unspecified: Secondary | ICD-10-CM

## 2011-03-09 LAB — CBC
HCT: 21.8 % — ABNORMAL LOW (ref 36.0–46.0)
Hemoglobin: 7.4 g/dL — ABNORMAL LOW (ref 12.0–15.0)
RBC: 2.48 MIL/uL — ABNORMAL LOW (ref 3.87–5.11)
RDW: 16 % — ABNORMAL HIGH (ref 11.5–15.5)
WBC: 9.5 10*3/uL (ref 4.0–10.5)

## 2011-03-09 LAB — BASIC METABOLIC PANEL
CO2: 21 mEq/L (ref 19–32)
Chloride: 115 mEq/L — ABNORMAL HIGH (ref 96–112)
GFR calc Af Amer: 75 mL/min — ABNORMAL LOW (ref >90)
Potassium: 4.3 mEq/L (ref 3.5–5.1)
Sodium: 141 mEq/L (ref 135–145)

## 2011-03-09 MED ORDER — IPRATROPIUM-ALBUTEROL 18-103 MCG/ACT IN AERO
2.0000 | INHALATION_SPRAY | RESPIRATORY_TRACT | Status: DC | PRN
  Administered 2011-03-11 – 2011-03-12 (×2): 2 via RESPIRATORY_TRACT
  Filled 2011-03-09 (×3): qty 14.7

## 2011-03-09 MED ORDER — IPRATROPIUM BROMIDE 0.02 % IN SOLN
0.5000 mg | RESPIRATORY_TRACT | Status: DC
  Administered 2011-03-09 (×2): 0.5 mg via RESPIRATORY_TRACT
  Filled 2011-03-09 (×2): qty 2.5

## 2011-03-09 MED ORDER — ALBUTEROL SULFATE HFA 108 (90 BASE) MCG/ACT IN AERS
1.0000 | INHALATION_SPRAY | RESPIRATORY_TRACT | Status: DC
  Administered 2011-03-09 (×2): 1 via RESPIRATORY_TRACT
  Filled 2011-03-09: qty 6.7

## 2011-03-09 NOTE — Progress Notes (Signed)
03/09/11 1020  Pt. transferred from 3300 via w/c by NT to 5531; no problems noted. Skin intact with discoloration-light spotches on face and neck;fingers. Alert and oriented to self and person occ. to place and time; some dementia noted. Generalized weakness noted. Pt. lives at [facility] living facility.  Leandrew Koyanagi Drexler Maland,RN

## 2011-03-09 NOTE — Progress Notes (Signed)
[facility] Progress Note  Subjective: No overnight events.  Transferred to general med bed.  No further rectal bleeding/hematochezia. Denies pain.  No n/v.  Very hungry.  Objective:  Vital signs in last 24 hours: Temp:  [97.8 F (36.6 C)-98.6 F (37 C)] 98.2 F (36.8 C) (01/01 1025) Pulse Rate:  [63-80] 77  (01/01 1025) Resp:  [16-28] 18  (01/01 1025) BP: (113-145)/(44-93) 145/83 mmHg (01/01 1025) SpO2:  [97 %-100 %] 97 % (01/01 1025) FiO2 (%):  [1 %] 1 % (01/01 1025) Last BM Date: 03/08/11 General:   Alert,  Well-developed,  NAD Heart:  Regular rate and rhythm; no murmurs Abdomen:  Soft, obese, nontender and nondistended. Normal bowel sounds, without guarding, and without rebound.   Extremities:  Without edema. Neurologic:  Alert and  oriented x4;  grossly normal neurologically. Psych:  Alert and cooperative. Normal mood and affect.  Intake/Output from previous day: 12/31 0701 - 01/01 0700 In: 2115 [P.O.:240; I.V.:1875] Out: 550 [Urine:550] Intake/Output this shift: Total I/O In: 490 [P.O.:240; I.V.:250] Out: -   Lab Results:  Basename 03/08/11 1531 03/08/11 0515 03/08/11 0153 03/07/11 0515  WBC 10.8* 10.9* -- 8.1  HGB 8.3*8.2* 8.3* 9.1* --  HCT 23.9*23.9* 24.2* 26.7* --  PLT 156 137* -- 162   BMET  Basename 03/09/11 0430 03/08/11 0515 03/07/11 0515  NA 141 144 142  K 4.3 4.2 4.4  CL 115* 117* 116*  CO2 21 22 21   GLUCOSE 65* 84 91  BUN 5* 11 14  CREATININE 0.84 0.89 0.92  CALCIUM 8.4 8.0* 7.8*    Studies/Results: Nm Gi Blood Loss  03/07/2011  *RADIOLOGY REPORT*  Clinical Data: Hematochezia and anemia requiring transfusion.  NUCLEAR MEDICINE GASTROINTESTINAL BLEEDING STUDY  Technique:  Sequential abdominal images were obtained following intravenous administration of Tc-30m labeled red blood cells.  Radiopharmaceutical: 25 mCi technetium 24m labeled red blood cells.  Comparison: CT of the abdomen pelvis dated 03/06/2011.  Findings: The first  hour of imaging was negative for GI bleed. During a second hour of imaging, abnormal activity was localized to the region of the splenic flexure or descending colon.  There was transit of activity in both antegrade and retrograde directions.  IMPRESSION: Positive bleeding study during second hour of imaging with abnormal bleeding localizing to the colon at the level of the splenic flexure or descending colon.  Original Report Authenticated By: Reola Calkins, M.D.    Assessment / Plan: 78 yo admitted with diverticular bleeding s/p colonoscopy revealing diverticulosis from transverse to sigmoid colon without active bleeding.  Normal TI without blood.  1. Diverticular hemorrhage -- no further bleeding.  CBC pending for today.  Will advance diet.  Follow counts, transfuse if needed to target Hgb > 8.  If counts stable, no further bleeding and tolerating diet, can likely be discharged soon.  2. AKI - resolved.  Principal Problem:  *Anemia due to blood loss, acute Active Problems:  GERD  Hematochezia  Acute kidney injury  HTN (hypertension)     LOS: 4 days   [name] M  03/09/2011, 11:45 AM

## 2011-03-09 NOTE — Progress Notes (Signed)
Physical Therapy Evaluation Patient Details Name: [name] MRN: [id number] DOB: Apr 15, 1929 Today's Date: 03/09/2011 0900-0940 EV2  Discharge Recommendations: Patient will benefit from HHPT and 24 hour assist at d/c (although reports she does not want out side help in the home, and indicates son and daughter in law may not be able to provide 24 hour assist.)  Will update recommendations as necessary. Problem List:  Patient Active Problem List  Diagnoses  . MALLORY-WEISS SYNDROME  . GERD  . HIATAL HERNIA  . Hematochezia  . Acute kidney injury  . HTN (hypertension)  . Anemia due to blood loss, acute    Past Medical History:  Past Medical History  Diagnosis Date  . Asthma   . Shortness of breath   . Hypertension   . Blood transfusion    Past Surgical History: History reviewed. No pertinent past surgical history.  PT Assessment/Plan/Recommendation PT Assessment Clinical Impression Statement: Patient with GIB presents with decreased activity tolerance with limited mobility due to generalized weakness and will benefit from skilled PT to progress to independence as patient not willing to have out side help in her home.  States will have some help from son and daughter in law. PT Recommendation/Assessment: Patient will need skilled PT in the acute care venue PT Problem List: Decreased strength;Decreased activity tolerance;Decreased mobility;Decreased knowledge of use of DME Barriers to Discharge: Decreased caregiver support PT Therapy Diagnosis : Abnormality of gait;Generalized weakness PT Plan PT Frequency: Min 3X/week PT Treatment/Interventions: Gait training;DME instruction;Functional mobility training;Therapeutic activities;Therapeutic exercise;Balance training;Patient/family education PT Recommendation Recommendations for Other Services: OT consult Follow Up Recommendations: Home health PT;24 hour supervision/assistance (but patient not wanting outside help in the  home.) Equipment Recommended: None recommended by PT PT Goals  Acute Rehab PT Goals PT Goal Formulation: With patient Time For Goal Achievement: 7 days Pt will go Supine/Side to Sit: with modified independence PT Goal: Supine/Side to Sit - Progress: Not met Pt will go Sit to Supine/Side: with modified independence PT Goal: Sit to Supine/Side - Progress: Not met Pt will go Sit to Stand: with modified independence PT Goal: Sit to Stand - Progress: Not met Pt will go Stand to Sit: with modified independence PT Goal: Stand to Sit - Progress: Not met Pt will Ambulate: 51 - 150 feet;with least restrictive assistive device;with modified independence PT Goal: Ambulate - Progress: Not met  PT Evaluation Precautions/Restrictions  Precautions Precautions: Fall Prior Functioning  Home Living Lives With: Alone Type of Home: Apartment (senior living apt) Home Layout: One level Home Access: Engineer, maintenance (IT) Shower/Tub: Naval architect Equipment: Shower chair with back;Walker - rolling;Hand-held shower hose;Grab bars in shower;Grab bars around toilet Prior Function Level of Independence: Independent with basic ADLs;Independent with transfers;Independent with gait;Independent with homemaking with ambulation Cognition Cognition Arousal/Alertness: Awake/alert Overall Cognitive Status: Appears within functional limits for tasks assessed Orientation Level: Oriented to person;Oriented to place (occ. forgetful;ST memory) Cognition - Other Comments: complained of feeling a little cloudy in thinking, admits to using written reminders a lot at home Sensation/Coordination Sensation Light Touch: Appears Intact Extremity Assessment RLE Assessment RLE Assessment: Within Functional Limits LLE Assessment LLE Assessment: Within Functional Limits Mobility (including Balance) Bed Mobility Bed Mobility: No (pt up in chair) Transfers Transfers: Yes Sit to Stand: 4: Min assist;From  chair/3-in-1;From toilet;With upper extremity assist;With armrests Sit to Stand Details (indicate cue type and reason): needing more assist from low seat of 3:1 and armchair, less help from high recliner Stand to Sit: 4: Min assist;To chair/3-in-1 Stand  to Sit Details: for lines and cues for safety Stand Pivot Transfers: 4: Min assist Stand Pivot Transfer Details (indicate cue type and reason): recliner to 3:1 Ambulation/Gait Ambulation/Gait: Yes Ambulation/Gait Assistance: 4: Min assist Ambulation/Gait Assistance Details (indicate cue type and reason): for balance and safety Ambulation Distance (Feet): 12 Feet (times two) Assistive device: Rolling walker Gait Pattern: Decreased stride length Gait velocity: very slow with pauses with complaints of light headed    Exercise    End of Session PT - End of Session Equipment Utilized During Treatment: Gait belt Activity Tolerance: Patient limited by fatigue Patient left: in chair;with call bell in reach General Behavior During Session: [facility] for tasks performed Cognition: [facility] for tasks performed  [facility] 03/09/2011, 12:44 PM

## 2011-03-09 NOTE — Progress Notes (Signed)
Patient ID: [name], female   DOB: 1930-01-20, 78 y.o.   MRN: [id number] Subjective: No events overnight. Patient denies chest pain, shortness of breath, abdominal pain.  Objective:  Vital signs in last 24 hours:  Filed Vitals:   03/09/11 0910 03/09/11 1025 03/09/11 1216 03/09/11 1446  BP: 132/97 145/83  103/60  Pulse: 79 77  72  Temp:  98.2 F (36.8 C)  98.3 F (36.8 C)  TempSrc:  Oral    Resp: 22 18  18   Height:      Weight:      SpO2: 94% 97% 96% 97%    Intake/Output from previous day:   Intake/Output Summary (Last 24 hours) at 03/09/11 1509 Last data filed at 03/09/11 1447  Gross per 24 hour  Intake   2405 ml  Output    200 ml  Net   2205 ml    Physical Exam: General: Alert, awake, oriented x3, in no acute distress. HEENT: No bruits, no goiter. Moist mucous membranes, no scleral icterus, no conjunctival pallor. Heart: Regular rate and rhythm, S1/S2 +, no murmurs, rubs, gallops. Lungs: Clear to auscultation bilaterally. No wheezing, no rhonchi, no rales.  Abdomen: Soft, nontender, nondistended, positive bowel sounds. Extremities: No clubbing or cyanosis, no pitting edema,  positive pedal pulses. Neuro: Grossly nonfocal.  Lab Results:  Basic Metabolic Panel:    Component Value Date/Time   NA 141 03/09/2011 0430   K 4.3 03/09/2011 0430   CL 115* 03/09/2011 0430   CO2 21 03/09/2011 0430   BUN 5* 03/09/2011 0430   CREATININE 0.84 03/09/2011 0430   GLUCOSE 65* 03/09/2011 0430   CALCIUM 8.4 03/09/2011 0430   CBC:    Component Value Date/Time   WBC 9.5 03/09/2011 1226   HGB 7.4* 03/09/2011 1226   HCT 21.8* 03/09/2011 1226   PLT 196 03/09/2011 1226   MCV 87.9 03/09/2011 1226   NEUTROABS 3.9 04/02/2010 1403   LYMPHSABS 2.1 04/02/2010 1403   MONOABS 0.8 04/02/2010 1403   EOSABS 0.1 04/02/2010 1403   BASOSABS 0.0 04/02/2010 1403      Lab 03/09/11 1226 03/08/11 1531 03/08/11 0515 03/08/11 0153 03/07/11 0515 03/05/11 2240  WBC 9.5 10.8* 10.9* -- 8.1 9.4  HGB 7.4* 8.3*8.2*  8.3* 9.1* 6.3* --  HCT 21.8* 23.9*23.9* 24.2* 26.7* 18.9* --  PLT 196 156 137* -- 162 260  MCV 87.9 87.2 85.8 -- 86.3 88.1  MCH 29.8 29.9 29.4 -- 28.8 29.2  MCHC 33.9 34.3 34.3 -- 33.3 33.2  RDW 16.0* 16.2* 15.8* -- 17.4* 16.4*  LYMPHSABS -- -- -- -- -- --  MONOABS -- -- -- -- -- --  EOSABS -- -- -- -- -- --  BASOSABS -- -- -- -- -- --  BANDABS -- -- -- -- -- --    Lab 03/09/11 0430 03/08/11 0515 03/07/11 0515 03/06/11 0546 03/06/11 0545 03/05/11 2240  NA 141 144 142 -- -- 139  K 4.3 4.2 4.4 3.2* -- 2.5*  CL 115* 117* 116* -- -- 105  CO2 21 22 21  -- -- 22  GLUCOSE 65* 84 91 -- -- 166*  BUN 5* 11 14 -- -- 21  CREATININE 0.84 0.89 0.92 -- -- 1.25*  CALCIUM 8.4 8.0* 7.8* -- -- 8.3*  MG -- -- -- -- 1.9 --    Lab 03/05/11 2240  INR 1.12  PROTIME --   Cardiac markers: No results found for this basename: CK:3,CKMB:3,TROPONINI:3,MYOGLOBIN:3 in the last 168 hours No components found with this basename:  POCBNP:3 Recent Results (from the past 240 hour(s))  MRSA PCR SCREENING     Status: Normal   Collection Time   03/06/11  6:24 PM      Component Value Range Status Comment   MRSA by PCR NEGATIVE  NEGATIVE  Final     Studies/Results: Nm Gi Blood Loss  03/07/2011  *RADIOLOGY REPORT*  Clinical Data: Hematochezia and anemia requiring transfusion.  NUCLEAR MEDICINE GASTROINTESTINAL BLEEDING STUDY  Technique:  Sequential abdominal images were obtained following intravenous administration of Tc-11m labeled red blood cells.  Radiopharmaceutical: 25 mCi technetium 29m labeled red blood cells.  Comparison: CT of the abdomen pelvis dated 03/06/2011.  Findings: The first hour of imaging was negative for GI bleed. During a second hour of imaging, abnormal activity was localized to the region of the splenic flexure or descending colon.  There was transit of activity in both antegrade and retrograde directions.  IMPRESSION: Positive bleeding study during second hour of imaging with abnormal bleeding  localizing to the colon at the level of the splenic flexure or descending colon.  Original Report Authenticated By: Reola Calkins, M.D.    Medications: Scheduled Meds:   . albuterol  1 puff Inhalation Q4H  . ipratropium  0.5 mg Nebulization Q4H  . DISCONTD: sodium chloride   Intravenous Once   Continuous Infusions:   . 0.9 % NaCl with KCl 40 mEq / L 100 mL/hr at 03/09/11 0800  . pantoprozole (PROTONIX) infusion 8 mg/hr (03/09/11 1308)   PRN Meds:.acetaminophen, acetaminophen, alum & mag hydroxide-simeth, HYDROmorphone, ondansetron (ZOFRAN) IV, ondansetron, oxyCODONE, zolpidem, DISCONTD: albuterol, DISCONTD: ipratropium  Assessment/Plan:   Principal Problem:   *Anemia due to blood loss, acute  - as per GI colonoscopy severe diverticulosis and some dilute blood in left colon  - will keep Hgb >8.0; today Hgb 7.4 so we will transfuse 2 units PRBC - GI is following   Active Problems:   Hematochezia  - possible diverticular bleed  - GI is following   GERD  - continue protonix   Acute kidney injury  - likely prerenal  - resolved   HTN (hypertension)  - at goal   EDUCATION  - test results and diagnostic studies were discussed with patient and pt's family who was present at the bedside  - patient and family have verbalized the understanding  - questions were answered at the bedside and contact information was provided for additional questions or concerns      LOS: 4 days   Shila Kruczek 03/09/2011, 3:09 PM  [facility] Pager: [phone]

## 2011-03-09 NOTE — Progress Notes (Signed)
03/09/11 1415  Daughter [name] wants SW consult for placement for her mother ans Dr. Elisabeth [name] notified.  [name] [name] Dann,RN

## 2011-03-10 ENCOUNTER — Encounter (HOSPITAL_COMMUNITY): Payer: Self-pay | Admitting: Internal Medicine

## 2011-03-10 DIAGNOSIS — K922 Gastrointestinal hemorrhage, unspecified: Secondary | ICD-10-CM

## 2011-03-10 DIAGNOSIS — D62 Acute posthemorrhagic anemia: Secondary | ICD-10-CM

## 2011-03-10 LAB — CBC
Hemoglobin: 10.2 g/dL — ABNORMAL LOW (ref 12.0–15.0)
MCH: 30.5 pg (ref 26.0–34.0)
MCHC: 34.8 g/dL (ref 30.0–36.0)
MCV: 87.7 fL (ref 78.0–100.0)
Platelets: 219 10*3/uL (ref 150–400)
RBC: 3.34 MIL/uL — ABNORMAL LOW (ref 3.87–5.11)

## 2011-03-10 LAB — BASIC METABOLIC PANEL
Chloride: 108 mEq/L (ref 96–112)
Creatinine, Ser: 1.1 mg/dL (ref 0.50–1.10)
GFR calc non Af Amer: 46 mL/min — ABNORMAL LOW (ref >90)
Potassium: 4.1 mEq/L (ref 3.5–5.1)

## 2011-03-10 MED ORDER — FUROSEMIDE 10 MG/ML IJ SOLN
20.0000 mg | INTRAMUSCULAR | Status: AC
  Administered 2011-03-10: 20 mg via INTRAVENOUS
  Filled 2011-03-10: qty 2

## 2011-03-10 NOTE — Progress Notes (Signed)
Patient ID: [name], female   DOB: 1929-09-18, 78 y.o.   MRN: [id number] Patient ID: [name], female   DOB: [birth year], 78 y.o.   MRN: [id number] Subjective: No events overnight.   Patient reports shortness of breath with ambulation.  She appears slightly sob lying in bed. Daughter reports her mother does not take her medications and abuses alcohol. Further that she has increased periods of confusion in the mornings and evenings.  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  Objective: Vital signs in last 24 hours:  Filed Vitals:   03/10/11 0343 03/10/11 0430 03/10/11 0523 03/10/11 1412  BP: 142/93 136/88 144/74 168/90  Pulse: 78 78 70 92  Temp: 98.5 F (36.9 C) 98.3 F (36.8 C) 98.7 F (37.1 C) 98 F (36.7 C)  TempSrc: Oral Oral Oral   Resp: 20 22 20 20   Height:      Weight:      SpO2:   100% 100%    Intake/Output from previous day:   Intake/Output Summary (Last 24 hours) at 03/10/11 1456 Last data filed at 03/10/11 1100  Gross per 24 hour  Intake 3452.08 ml  Output      0 ml  Net 3452.08 ml    Physical Exam: General: Alert, awake, oriented x3, in no acute distress.  HEENT: No bruits, no goiter. Moist mucous membranes, no scleral icterus, no conjunctival pallor. Heart: Regular rate and rhythm, S1/S2 +, no murmurs, rubs, gallops. Lungs: Clear to auscultation bilaterally. No wheezing, no rhonchi, no rales. Decreased breath sounds.  Slightly increased wob at rest. Abdomen: Soft, nontender, nondistended, positive bowel sounds. Extremities: No clubbing or cyanosis, no pitting edema,  positive pedal pulses. Neuro: Grossly nonfocal.  Lab Results:  Basic Metabolic Panel:    Component Value Date/Time   NA 139 03/10/2011 0715   K 4.1 03/10/2011 0715   CL 108 03/10/2011 0715   CO2 26 03/10/2011 0715   BUN 6 03/10/2011 0715   CREATININE 1.10 03/10/2011 0715   GLUCOSE 98 03/10/2011 0715   CALCIUM 8.8 03/10/2011 0715   CBC:      Component Value Date/Time   WBC 9.3 03/10/2011 0715   HGB 10.2* 03/10/2011 0715   HCT 29.3* 03/10/2011 0715   PLT 219 03/10/2011 0715   MCV 87.7 03/10/2011 0715   NEUTROABS 3.9 04/02/2010 1403   LYMPHSABS 2.1 04/02/2010 1403   MONOABS 0.8 04/02/2010 1403   EOSABS 0.1 04/02/2010 1403   BASOSABS 0.0 04/02/2010 1403      Lab 03/10/11 0715 03/09/11 1226 03/08/11 1531 03/08/11 0515 03/08/11 0153 03/07/11 0515  WBC 9.3 9.5 10.8* 10.9* -- 8.1  HGB 10.2* 7.4* 8.3*8.2* 8.3* 9.1* --  HCT 29.3* 21.8* 23.9*23.9* 24.2* 26.7* --  PLT 219 196 156 137* -- 162  MCV 87.7 87.9 87.2 85.8 -- 86.3  MCH 30.5 29.8 29.9 29.4 -- 28.8  MCHC 34.8 33.9 34.3 34.3 -- 33.3  RDW 15.1 16.0* 16.2* 15.8* -- 17.4*  LYMPHSABS -- -- -- -- -- --  MONOABS -- -- -- -- -- --  EOSABS -- -- -- -- -- --  BASOSABS -- -- -- -- -- --  BANDABS -- -- -- -- -- --    Lab 03/10/11 0715 03/09/11 0430 03/08/11 0515 03/07/11 0515 03/06/11 0546 03/06/11 0545 03/05/11 2240  NA 139 141 144 142 -- -- 139  K 4.1 4.3 4.2 4.4 3.2* -- --  CL 108 115* 117* 116* -- --  105  CO2 26 21 22 21  -- -- 22  GLUCOSE 98 65* 84 91 -- -- 166*  BUN 6 5* 11 14 -- -- 21  CREATININE 1.10 0.84 0.89 0.92 -- -- 1.25*  CALCIUM 8.8 8.4 8.0* 7.8* -- -- 8.3*  MG -- -- -- -- -- 1.9 --    Lab 03/05/11 2240  INR 1.12  PROTIME --   Cardiac markers: No results found for this basename: CK:3,CKMB:3,TROPONINI:3,MYOGLOBIN:3 in the last 168 hours No components found with this basename: POCBNP:3 Recent Results (from the past 240 hour(s))  MRSA PCR SCREENING     Status: Normal   Collection Time   03/06/11  6:24 PM      Component Value Range Status Comment   MRSA by PCR NEGATIVE  NEGATIVE  Final     Studies/Results: No results found.  Medications: Scheduled Meds:    . furosemide  20 mg Intravenous NOW  . furosemide  20 mg Intravenous NOW  . DISCONTD: albuterol  1 puff Inhalation Q4H  . DISCONTD: ipratropium  0.5 mg Nebulization Q4H   Continuous Infusions:     . DISCONTD: 0.9 % NaCl with KCl 40 mEq / L 100 mL/hr at 03/10/11 1131  . DISCONTD: pantoprozole (PROTONIX) infusion 8 mg/hr (03/09/11 1308)   PRN Meds:.acetaminophen, acetaminophen, albuterol-ipratropium, alum & mag hydroxide-simeth, ondansetron (ZOFRAN) IV, ondansetron, oxyCODONE, zolpidem, DISCONTD: HYDROmorphone  Assessment/Plan:   Principal Problem:   *Anemia due to blood loss, acute  Diverticular Bleed.  GI has signed off.  The patient has had no more bleeding since admission. She received 2 units of PRBCs and is now sob.  Likely has mild volume overload.  Active Problems:   Shortness of Breath - the patient became short of breath going to the restroom. She has a history of asthma and uses nebulizers at home. She likely has mild volume overload secondary to receiving blood and IV fluids. Will give one dose of IV Lasix.  GERD  - continue protonix   Acute kidney injury  - likely prerenal  - resolved   HTN (hypertension)  - at goal   Disposition:  Physical Therapy recommends 24 hour supervision.  She adamantly refuses assisted living facility, skilled nursing facility, or 24-hour care in her home. She also refuses to live with her daughter. She agrees to home health services that will visit her in the afternoon for a brief periods.  The patient was judged to be competent by Dr. Thedore Mins.  It was clearly explained to her the dangers of going home without supervision could include stroke or death, the patient was willing to accept these risks.       LOS: 5 days   Stephani Police 03/10/2011, 2:56 PM  [phone]

## 2011-03-10 NOTE — Progress Notes (Signed)
[facility] Progress Note  SUBJECTIVE: No further bleeding.  OBJECTIVE:  Vital signs in last 24 hours: Temp:  [98.1 F (36.7 C)-99.2 F (37.3 C)] 98.7 F (37.1 C) (01/02 0523) Pulse Rate:  [57-79] 70  (01/02 0523) Resp:  [18-22] 20  (01/02 0523) BP: (103-145)/(60-93) 144/74 mmHg (01/02 0523) SpO2:  [95 %-100 %] 100 % (01/02 0523) FiO2 (%):  [1 %] 1 % (01/01 1025) Last BM Date: 03/08/11 General:   Well-developed,  black female in NAD Lungs: Anterior wheezing.  Abdomen:  Soft, nontender and nondistended. Normal bowel sounds. Neurologic:  Alert and  oriented x4;  grossly normal neurologically. Psych:  Cooperative. Normal mood and affect.  Lab Results:  Basename 03/10/11 0715 03/09/11 1226 03/08/11 1531  WBC 9.3 9.5 10.8*  HGB 10.2* 7.4* 8.3*8.2*  HCT 29.3* 21.8* 23.9*23.9*  PLT 219 196 156   BMET  Basename 03/10/11 0715 03/09/11 0430 03/08/11 0515  NA 139 141 144  K 4.1 4.3 4.2  CL 108 115* 117*  CO2 26 21 22   GLUCOSE 98 65* 84  BUN 6 5* 11  CREATININE 1.10 0.84 0.89  CALCIUM 8.8 8.4 8.0*   ASSESSMENT / PLAN: 1. Diverticular bleed, resolved. Severe diverticulosis on colonoscopy this admission.  2. Anemia of acute blood loss secondary to #1. S/P several unit of PRBC this admission including 2 units yesterday. Hemoglobin up from 7.4 yesterday to 10.2 today.  3. AKI - resolved.  Will sign off, call for questons.   LOS: 5 days   Willette Cluster  03/10/2011, 9:45 AM

## 2011-03-10 NOTE — Progress Notes (Signed)
Patient seen and I agree with the above documentation, including the assessment and plan. Call with questions. 

## 2011-03-10 NOTE — Progress Notes (Signed)
I have directly reviewed the clinical findings, lab, imaging studies and management of this patient in detail. I have interviewed and examined the pt and agree with the documentation,  as recorded by NP.  Patient counseled bedside appears competent and refuses SNF- Ass Living, assumes all responsibility for any adverse outcome including fall-Stroke-death-disability. Have requested Psych to Eval too.  [name] M.D on 03/10/2011 at 4:47 PM  [facility] Office  [phone]

## 2011-03-11 ENCOUNTER — Encounter (HOSPITAL_COMMUNITY): Payer: Self-pay | Admitting: Psychiatry

## 2011-03-11 LAB — CBC
HCT: 29.5 % — ABNORMAL LOW (ref 36.0–46.0)
MCHC: 34.9 g/dL (ref 30.0–36.0)
MCV: 88.1 fL (ref 78.0–100.0)
Platelets: 298 10*3/uL (ref 150–400)
RDW: 15.5 % (ref 11.5–15.5)

## 2011-03-11 LAB — TYPE AND SCREEN: Unit division: 0

## 2011-03-11 LAB — BASIC METABOLIC PANEL
BUN: 7 mg/dL (ref 6–23)
Calcium: 8.9 mg/dL (ref 8.4–10.5)
Creatinine, Ser: 0.95 mg/dL (ref 0.50–1.10)
GFR calc Af Amer: 64 mL/min — ABNORMAL LOW (ref >90)
GFR calc non Af Amer: 55 mL/min — ABNORMAL LOW (ref >90)

## 2011-03-11 MED ORDER — POTASSIUM CHLORIDE CRYS ER 20 MEQ PO TBCR
40.0000 meq | EXTENDED_RELEASE_TABLET | Freq: Once | ORAL | Status: AC
  Administered 2011-03-11: 40 meq via ORAL
  Filled 2011-03-11: qty 2

## 2011-03-11 NOTE — Progress Notes (Signed)
Physical Therapy Treatment Patient Details Name: [name] MRN: [id number] DOB: [birth year] Today's Date: 03/11/2011  PT Assessment/Plan  PT - Assessment/Plan Comments on Treatment Session: Pt presented initially with GI Bleed. Pt currently requires min assist with mobility for safety secondary to balance deficits and c/o light headedness with ambulation. Pt continues to refuse SNF although this is PT top recommendation. Pt absolutely needs supervision for ambulation secondary to falls risk as she required a chair behind her with ambulation today. Pt needs RW with all ambulation. Pt's BP was 125/74 mid-way through ambulation and pt's SpO2 >/= 95% with ambulation on room air.  PT Plan: Discharge plan remains appropriate;Equipment recommendations need to be updated PT Frequency: Min 3X/week Follow Up Recommendations: Home health PT;24 hour supervision/assistance Equipment Recommended: 3 in 1 bedside comode;Tub/shower seat PT Goals  Acute Rehab PT Goals PT Goal Formulation: With patient Time For Goal Achievement: 7 days Pt will go Supine/Side to Sit: with modified independence PT Goal: Supine/Side to Sit - Progress: Progressing toward goal Pt will go Sit to Supine/Side: with modified independence PT Goal: Sit to Supine/Side - Progress: Progressing toward goal Pt will go Sit to Stand: with modified independence PT Goal: Sit to Stand - Progress: Progressing toward goal Pt will go Stand to Sit: with modified independence PT Goal: Stand to Sit - Progress: Progressing toward goal Pt will Ambulate: 51 - 150 feet;with least restrictive assistive device;with modified independence PT Goal: Ambulate - Progress: Progressing toward goal  PT Treatment Precautions/Restrictions  Precautions Precautions: Fall Precaution Comments: Dizziness in standing Restrictions Weight Bearing Restrictions: No Mobility (including Balance) Bed Mobility Bed Mobility: Yes Rolling Left: 6: Modified independent  (Device/Increase time) Left Sidelying to Sit: 5: Supervision Left Sidelying to Sit Details (indicate cue type and reason): Verbal cues for efficiency Transfers Transfers: Yes Sit to Stand: 5: Supervision;From bed;From chair/3-in-1;With upper extremity assist;With armrests Sit to Stand Details (indicate cue type and reason): Multiple attempts necessary from low surface. Performed 3x. Supervision for safety Stand to Sit: To chair/3-in-1;To bed;5: Supervision Stand to Sit Details: Supervision for safety Ambulation/Gait Ambulation/Gait: Yes Ambulation/Gait Assistance: 4: Min assist Ambulation/Gait Assistance Details (indicate cue type and reason): Assist for safety secondary to decreased balance Ambulation Distance (Feet): 40 Feet (with two seated rest breaks. ) Assistive device: Rolling walker Gait Pattern: Decreased stride length;Trunk flexed;Shuffle (Decreased foot clearance bil. LEs) Gait velocity: Very slow gait with c/o lightheadedness again as with evaluation.   Posture/Postural Control Posture/Postural Control: No significant limitations Balance Balance Assessed: Yes Static Standing Balance Static Standing - Balance Support: Bilateral upper extremity supported Static Standing - Level of Assistance: 5: Stand by assistance Static Standing - Comment/# of Minutes: Pt requires RW for balance in standing.  End of Session PT - End of Session Equipment Utilized During Treatment: Gait belt Activity Tolerance: Patient limited by fatigue (Limited by dizziness) Patient left: with call bell in reach;in bed;with bed alarm set (seated EOB, with MD. ) Nurse Communication: Mobility status for transfers;Mobility status for ambulation (pt EOB with family and MD) General Behavior During Session: WFL for tasks performed Cognition: Impaired Cognitive Impairment: Pt with decreased awareness of deficits as she still feels she will be ok to go home alone with no assistance.   Sherie Don 03/11/2011,  3:15 PM  Sherie Don) Carleene Mains PT, DPT Acute Rehabilitation [phone]

## 2011-03-11 NOTE — Progress Notes (Signed)
   CARE MANAGEMENT NOTE 03/11/2011  Patient:  [name]   Account Number:  [id number]  Date Initiated:  03/11/2011  Documentation initiated by:  Onnie Boer  Subjective/Objective Assessment:   PT WAS ADMITTED WITH RECTAL BLEED     Action/Plan:   PROGRESSION OF CARE AND DISCHARGE PLANNING   Anticipated DC Date:  03/11/2011   Anticipated DC Plan:  HOME W HOME HEALTH SERVICES      DC Planning Services  CM consult      Choice offered to / List presented to:          [facility] arranged  HH-1 RN  HH-2 PT  HH-3 OT  HH-4 NURSE'S AIDE  HH-6 SOCIAL WORKER      HH agency  [facility]   Status of service:  Completed, signed off Medicare Important Message given?   (If response is "NO", the following Medicare IM given date fields will be blank) Date Medicare IM given:   Date Additional Medicare IM given:    Discharge Disposition:  HOME W HOME HEALTH SERVICES  Per UR Regulation:  Reviewed for med. necessity/level of care/duration of stay  Comments:  03/11/11 Onnie Boer, RN, BSN [phone] PT WAS ADMITTED WITH RECTAL BLD.  PT WAS DC'D TO HOME WITH HH PT/OT/RN/AIDE AND SW WITH [facility].

## 2011-03-11 NOTE — Progress Notes (Signed)
[name] CSN:[id number],MRN:[id number] is a 78 y.o. female,  Outpatient Primary MD for the patient is No primary provider on file.  Chief Complaint  Patient presents with  . GI Bleeding        Subjective:   [name] today has, No headache, No chest pain, No abdominal pain - No Nausea, No new weakness tingling or numbness, No Cough - SOB.  Objective:   Filed Vitals:   03/11/11 1355 03/11/11 1356 03/11/11 1357 03/11/11 1401  BP: 144/81 141/81 141/100 163/81  Pulse: 84 79 77 79  Temp:    98.3 F (36.8 C)  TempSrc:      Resp:    20  Height:      Weight:      SpO2:    98%    Wt Readings from Last 3 Encounters:  03/06/11 76.1 kg (167 lb 12.3 oz)  03/06/11 76.1 kg (167 lb 12.3 oz)     Intake/Output Summary (Last 24 hours) at 03/11/11 1410 Last data filed at 03/11/11 0900  Gross per 24 hour  Intake    480 ml  Output      0 ml  Net    480 ml    Exam Awake Alert, Oriented *3, No new F.N deficits, Normal affect Bowles.AT,PERRAL Supple Neck,No JVD, No cervical lymphadenopathy appriciated.  Symmetrical Chest wall movement, Good air movement bilaterally, CTAB RRR,No Gallops,Rubs or new Murmurs, No Parasternal Heave +ve B.Sounds, Abd Soft, Non tender, No organomegaly appriciated, No rebound -guarding or rigidity. No Cyanosis, Clubbing or edema, No new Rash or bruise    Data Review  CBC  Lab 03/11/11 0555 03/10/11 0715 03/09/11 1226 03/08/11 1531 03/08/11 0515  WBC 8.0 9.3 9.5 10.8* 10.9*  HGB 10.3* 10.2* 7.4* 8.3*8.2* 8.3*  HCT 29.5* 29.3* 21.8* 23.9*23.9* 24.2*  PLT 298 219 196 156 137*  MCV 88.1 87.7 87.9 87.2 85.8  MCH 30.7 30.5 29.8 29.9 29.4  MCHC 34.9 34.8 33.9 34.3 34.3  RDW 15.5 15.1 16.0* 16.2* 15.8*  LYMPHSABS -- -- -- -- --  MONOABS -- -- -- -- --  EOSABS -- -- -- -- --  BASOSABS -- -- -- -- --  BANDABS -- -- -- -- --    Chemistries   Lab 03/11/11 0555 03/10/11 0715 03/09/11 0430 03/08/11 0515 03/07/11 0515 03/06/11 0545 03/05/11 2240  NA 143  139 141 144 142 -- --  K 3.3* 4.1 4.3 4.2 4.4 -- --  CL 105 108 115* 117* 116* -- --  CO2 28 26 21 22 21  -- --  GLUCOSE 86 98 65* 84 91 -- --  BUN 7 6 5* 11 14 -- --  CREATININE 0.95 1.10 0.84 0.89 0.92 -- --  CALCIUM 8.9 8.8 8.4 8.0* 7.8* -- --  MG -- -- -- -- -- 1.9 --  AST -- -- -- -- -- -- 11  ALT -- -- -- -- -- -- 8  ALKPHOS -- -- -- -- -- -- 61  BILITOT -- -- -- -- -- -- 0.3   ------------------------------------------------------------------------------------------------------------------ estimated creatinine clearance is 49 ml/min (by C-G formula based on Cr of 0.95). ------------------------------------------------------------------------------------------------------------------ No results found for this basename: HGBA1C:2 in the last 72 hours ------------------------------------------------------------------------------------------------------------------ No results found for this basename: CHOL:2,HDL:2,LDLCALC:2,TRIG:2,CHOLHDL:2,LDLDIRECT:2 in the last 72 hours ------------------------------------------------------------------------------------------------------------------ No results found for this basename: TSH,T4TOTAL,FREET3,T3FREE,THYROIDAB in the last 72 hours ------------------------------------------------------------------------------------------------------------------ No results found for this basename: VITAMINB12:2,FOLATE:2,FERRITIN:2,TIBC:2,IRON:2,RETICCTPCT:2 in the last 72 hours  Coagulation profile  Lab 03/05/11 2240  INR 1.12  PROTIME --  No results found for this basename: DDIMER:2 in the last 72 hours  Cardiac Enzymes No results found for this basename: CK:3,CKMB:3,TROPONINI:3,MYOGLOBIN:3 in the last 168 hours ------------------------------------------------------------------------------------------------------------------ No components found with this basename: POCBNP:3  Micro Results Recent Results (from the past 240 hour(s))  MRSA PCR  SCREENING     Status: Normal   Collection Time   03/06/11  6:24 PM      Component Value Range Status Comment   MRSA by PCR NEGATIVE  NEGATIVE  Final     Radiology Reports Nm Gi Blood Loss  03/07/2011  *RADIOLOGY REPORT*  Clinical Data: Hematochezia and anemia requiring transfusion.  NUCLEAR MEDICINE GASTROINTESTINAL BLEEDING STUDY  Technique:  Sequential abdominal images were obtained following intravenous administration of Tc-41m labeled red blood cells.  Radiopharmaceutical: 25 mCi technetium 26m labeled red blood cells.  Comparison: CT of the abdomen pelvis dated 03/06/2011.  Findings: The first hour of imaging was negative for GI bleed. During a second hour of imaging, abnormal activity was localized to the region of the splenic flexure or descending colon.  There was transit of activity in both antegrade and retrograde directions.  IMPRESSION: Positive bleeding study during second hour of imaging with abnormal bleeding localizing to the colon at the level of the splenic flexure or descending colon.  Original Report Authenticated By: Reola Calkins, M.D.   Ct Abdomen Pelvis W Contrast  03/06/2011  *RADIOLOGY REPORT*  Clinical Data: Abdominal pain, bloody diarrhea  CT ABDOMEN AND PELVIS WITH CONTRAST  Technique:  Multidetector CT imaging of the abdomen and pelvis was performed following the standard protocol during bolus administration of intravenous contrast.  Contrast: 20mL OMNIPAQUE IOHEXOL 300 MG/ML IV SOLN, OMNIPAQUE IOHEXOL 300 MG/ML IV SOLN  Comparison: None.  Findings: Lung bases are unremarkable.  Sagittal images of the spine shows diffuse osteopenia.  Degenerative changes thoracolumbar spine.  Significant disc space flattening with vacuum disc phenomenon noted at the L4-L5 and L5-S1 level.  Extensive atherosclerotic calcifications of the abdominal aorta, bilateral proximal renal artery, SMA origin and bilateral iliac arteries. Enhanced liver is unremarkable.  There is somewhat  heterogeneous gallbladder without calcified gallstones identified.  Enhanced pancreas, spleen and adrenal glands are unremarkable. Enhanced kidneys are symmetrical in size.  Mild lobulated renal contour and mild renal cortical thinning there is a probable cyst in lower pole of the right kidney measures 9 mm.  Probable cyst in lower pole of the left kidney measures 1.9 cm.  Small cyst mid pole of the left kidney measures 6 mm.  Probable cyst in mid pole of the right kidney posterior aspect measures 1.2 cm.  No hydronephrosis or hydroureter.  No small bowel obstruction.  No ascites or free air.  No adenopathy.  There is no pericecal inflammation.  Normal appendix is clearly visualized in axial image 63.  Mild gaseous distention sigmoid colon.  Left colon and sigmoid colon diverticula are noted without evidence of acute diverticulitis.  Scattered diverticula are noted in the right colon and transverse colon.  The uterus is atrophic.  In axial image 84 there is nonspecific thickening of the anal wall. Measures at least 1.3 cm in thickness.  High density material probable blood products noted in the region of the anal lumen/anal sphincter. Measures about 1.8 cm.  Correlation with clinical exam is recommended to exclude bleeding hemorrhoids, bleeding inflammation /anal fissure or neoplastic process.  Delayed renal images shows bilateral renal excretion.  The bilateral visualized proximal ureter is unremarkable.  IMPRESSION:  1. In axial  image 84 there is nonspecific thickening of the anal wall.  Measures at least 1.3 cm in thickness.  High density material probable blood products noted in the region of the anal lumen/anal sphincter. Measures about 1.8 cm.  Correlation with clinical exam is recommended to exclude bleeding hemorrhoids, bleeding inflammation /anal fissure or neoplastic process. 2.  Colonic diverticulosis without evidence of acute diverticulitis. 3.  No small bowel obstruction.  No ascites or free air. 4.   Bilateral probable renal cysts.  No hydronephrosis or hydroureter. 5.  No pericecal inflammation.  Normal appendix is clearly visualized. 6.  Extensive atherosclerotic vascular calcifications. 7. Degenerative changes thoracolumbar spine.  Original Report Authenticated By: Natasha Mead, M.D.    Scheduled Meds:   . furosemide  20 mg Intravenous NOW  . potassium chloride  40 mEq Oral Once   Continuous Infusions:   . DISCONTD: 0.9 % NaCl with KCl 40 mEq / L 100 mL/hr at 03/10/11 1131  . DISCONTD: pantoprozole (PROTONIX) infusion 8 mg/hr (03/09/11 1308)   PRN Meds:.acetaminophen, acetaminophen, albuterol-ipratropium, alum & mag hydroxide-simeth, ondansetron (ZOFRAN) IV, ondansetron, oxyCODONE, zolpidem, DISCONTD: HYDROmorphone  Assessment & Plan   Anemia from Diverticular Bleed-   Stable post transfusion, ASA held, outpt GI follow.   Shortness of Breath -post transfusion due to mild fluid OD, resolved with lasix x 1, will check Pox ambulating for any o2 need.    GERD  - continue protonix    Acute kidney injury  - likely prerenal  - resolved    HTN (hypertension)  - at goal     Pt appears competent and wants no placement or 24 hr supervision, psych to see later.   DVT Prophylaxis SCDs  See all Orders from today for further details    Leroy Sea M.D on 03/11/2011 at 2:10 PM  [facility] Office  [phone]

## 2011-03-11 NOTE — Consult Note (Signed)
Reason for Consult:Determination of capacity Referring Physician: Dr.Singh  [name] is an 78 y.o. female.  HPI: Pt is unable to state condition but reports she bleeds with Bowel movements and she began bleeding [per rectum] and thought it would not stop.  Past Medical History  Diagnosis Date  . Asthma   . Shortness of breath   . Hypertension   . Blood transfusion     Past Surgical History  Procedure Date  . Colonoscopy 03/08/2011    Procedure: COLONOSCOPY;  Surgeon: Erick Blinks, MD;  Location: [facility] ENDOSCOPY;  Service: Gastroenterology;  Laterality: N/A;    History reviewed. No pertinent family history.  Social History:  reports that she has quit smoking. Her smoking use included Cigarettes. She smoked 1 pack per day. She has never used smokeless tobacco. She reports that she does not drink alcohol or use illicit drugs.  Allergies:  Allergies  Allergen Reactions  . Penicillins Other (See Comments)    unknown    Medications: I have reviewed the patient's current medications.  Results for orders placed during the hospital encounter of 03/05/11 (from the past 48 hour(s))  PREPARE RBC (CROSSMATCH)     Status: Normal   Collection Time   03/09/11  3:30 PM      Component Value Range Comment   Order Confirmation NO CURRENT SAMPLE, MUST ORDER TYPE AND SCREEN     TYPE AND SCREEN     Status: Normal   Collection Time   03/09/11  4:46 PM      Component Value Range Comment   ABO/RH(D) O POS      Antibody Screen NEG      Sample Expiration 03/12/2011      Unit Number [id number]      Blood Component Type RED CELLS,LR      Unit division 00      Status of Unit ISSUED,FINAL      Transfusion Status OK TO TRANSFUSE      Crossmatch Result Compatible      Unit Number [id number]      Blood Component Type RED CELLS,LR      Unit division 00      Status of Unit ISSUED,FINAL      Transfusion Status OK TO TRANSFUSE      Crossmatch Result Compatible     BASIC METABOLIC PANEL     Status:  Abnormal   Collection Time   03/10/11  7:15 AM      Component Value Range Comment   Sodium 139  135 - 145 (mEq/L)    Potassium 4.1  3.5 - 5.1 (mEq/L)    Chloride 108  96 - 112 (mEq/L)    CO2 26  19 - 32 (mEq/L)    Glucose, Bld 98  70 - 99 (mg/dL)    BUN 6  6 - 23 (mg/dL)    Creatinine, Ser 4.13  0.50 - 1.10 (mg/dL)    Calcium 8.8  8.4 - 10.5 (mg/dL)    GFR calc non Af Amer 46 (*) >90 (mL/min)    GFR calc Af Amer 54 (*) >90 (mL/min)   CBC     Status: Abnormal   Collection Time   03/10/11  7:15 AM      Component Value Range Comment   WBC 9.3  4.0 - 10.5 (K/uL)    RBC 3.34 (*) 3.87 - 5.11 (MIL/uL)    Hemoglobin 10.2 (*) 12.0 - 15.0 (g/dL)    HCT 24.4 (*) 01.0 - 46.0 (%)  MCV 87.7  78.0 - 100.0 (fL)    MCH 30.5  26.0 - 34.0 (pg)    MCHC 34.8  30.0 - 36.0 (g/dL)    RDW 16.1  09.6 - 04.5 (%)    Platelets 219  150 - 400 (K/uL)   CBC     Status: Abnormal   Collection Time   03/11/11  5:55 AM      Component Value Range Comment   WBC 8.0  4.0 - 10.5 (K/uL)    RBC 3.35 (*) 3.87 - 5.11 (MIL/uL)    Hemoglobin 10.3 (*) 12.0 - 15.0 (g/dL)    HCT 40.9 (*) 81.1 - 46.0 (%)    MCV 88.1  78.0 - 100.0 (fL)    MCH 30.7  26.0 - 34.0 (pg)    MCHC 34.9  30.0 - 36.0 (g/dL)    RDW 91.4  78.2 - 95.6 (%)    Platelets 298  150 - 400 (K/uL)   BASIC METABOLIC PANEL     Status: Abnormal   Collection Time   03/11/11  5:55 AM      Component Value Range Comment   Sodium 143  135 - 145 (mEq/L)    Potassium 3.3 (*) 3.5 - 5.1 (mEq/L)    Chloride 105  96 - 112 (mEq/L)    CO2 28  19 - 32 (mEq/L)    Glucose, Bld 86  70 - 99 (mg/dL)    BUN 7  6 - 23 (mg/dL)    Creatinine, Ser 2.13  0.50 - 1.10 (mg/dL)    Calcium 8.9  8.4 - 10.5 (mg/dL)    GFR calc non Af Amer 55 (*) >90 (mL/min)    GFR calc Af Amer 64 (*) >90 (mL/min)     No results found.  Review of Systems  Unable to perform ROS: age   Blood pressure 125/74, pulse 79, temperature 98.3 F (36.8 C), temperature source Oral, resp. rate 20, height 5\' 5"   (1.651 m), weight 76.1 kg (167 lb 12.3 oz), SpO2 99.00%. Physical Exam  Assessment/Plan: [name] is sitting on the side of her bed.  Her daughter and daughter-in-law are with her.  She is alert with good eye contact.  She acknowledges her name.  She cannot name her condition but knows she was bleeding too much.  She is asked if she is ready to go home and her answer is equivocal.  When asked where she will go upon discharge, she says 'home'.  Her daughter adds that she had her mother stay with her for a year.  She is a Financial controller.  One day she came home and mother had moved to her apartment.  She complains her [place]'s Apart[place].  She says she cooks for herself; daughter says 'some days' She says she takes her medications from the bottle but not always because the medicine makes her feel 'bad'.  She says she can read and wears glasses. She is asked if she would like to be in assisted living where meals are cooked and cleaning is provided.  She states she is not too social, does not like other people bringing food into her home [fears germs] and says she is in assisted living.  Her PCP had referred her for help and Meals on Wheels and she declined services.  Her daughter says they had tried to help her mother move but she refuses.  She says she spends most of her time in bed and does  not like to socialize.  She is confused in mornings - may call daughter 20+ times and in the evening.   Her daughter says she has durable power of attorney but elects not to use it. There have been times that she tells her daughter that she does not think she can manage; then can get angry if change is suggested.  MSE:  She is oriented to person place and situation.  She has a blunted affect and calm mood.  Her speech is clear, normal rate. spontaneous and organized.  She denies auditory or visual hallucinations but expresses numerous paranoid attitudes about socializing and being in a  dependent position of services.  She is very determined to remain in her own apartment.  She has a consistent support system of two siblings in the area and extended relatives. There is no history of 'sun downing' but daughter states she does get more disoriented am and pm.  Her insight and judgement are poor but she expresses a sense of independence.  RECOMMENDATION: 1. This patient may have mild dementia - but note, she managed to move back to her apartment while daughter was at work. 2. It is recommended to review her medication regimen IF she is getting ill after taking them and if possible, provide someone to routinely visit and at least weekly set out her pills/day and time.  3.  This patient is alert and oriented and demonstrates the capacity to express her preferences.  4.  Today, she became light-headed when walking with PT.  She may required home PT, following the severe hematochezia episode to assure she may not have an hypotensive fall when alone. 5.  The daughter may at some future time needs to exercise durable power of attorney to safeguard her mother's well being, but at a time when she is more incapacitated or a danger to herself due to her inability to follow dietary needs and medication schedule to maintain her independent living. Marland Kitchen   Naphtali Riede 03/11/2011, 3:25 PM

## 2011-03-12 ENCOUNTER — Inpatient Hospital Stay (HOSPITAL_COMMUNITY): Payer: Medicare Other

## 2011-03-12 DIAGNOSIS — R918 Other nonspecific abnormal finding of lung field: Secondary | ICD-10-CM

## 2011-03-12 DIAGNOSIS — I359 Nonrheumatic aortic valve disorder, unspecified: Secondary | ICD-10-CM

## 2011-03-12 DIAGNOSIS — R0602 Shortness of breath: Secondary | ICD-10-CM

## 2011-03-12 MED ORDER — SODIUM CHLORIDE 0.9 % IV SOLN
INTRAVENOUS | Status: AC
  Administered 2011-03-12: 12:00:00 via INTRAVENOUS

## 2011-03-12 MED ORDER — ALBUTEROL SULFATE (5 MG/ML) 0.5% IN NEBU
2.5000 mg | INHALATION_SOLUTION | Freq: Three times a day (TID) | RESPIRATORY_TRACT | Status: DC
  Administered 2011-03-12 – 2011-03-15 (×10): 2.5 mg via RESPIRATORY_TRACT
  Filled 2011-03-12 (×9): qty 0.5

## 2011-03-12 MED ORDER — IOHEXOL 350 MG/ML SOLN
80.0000 mL | Freq: Once | INTRAVENOUS | Status: AC | PRN
  Administered 2011-03-12: 80 mL via INTRAVENOUS

## 2011-03-12 NOTE — Progress Notes (Signed)
PT Cancellation Note  Pt declining PT secondary to just getting back in bed and having multiple procedures today stating that she is fatigued. Thanks,  Dahlia Client (Beverely Pace) Carleene Mains PT, DPT Acute Rehabilitation [phone]

## 2011-03-12 NOTE — Progress Notes (Signed)
[name] CSN:[id number],MRN:[id number] is a 78 y.o. female,  Outpatient Primary MD for the patient is No primary provider on file.  Chief Complaint  Patient presents with  . GI Bleeding        Subjective:   [name] today has, No headache, No chest pain, No abdominal pain - No Nausea, No new weakness tingling or numbness, No Cough - SOB.   Objective:   Filed Vitals:   03/12/11 0920 03/12/11 0923 03/12/11 0926 03/12/11 1000  BP: 143/90 147/74 131/73   Pulse: 79 89 83   Temp: 97.8 F (36.6 C)     TempSrc: Oral     Resp: 20     Height:      Weight:      SpO2: 100%   72%    Wt Readings from Last 3 Encounters:  03/06/11 76.1 kg (167 lb 12.3 oz)  03/06/11 76.1 kg (167 lb 12.3 oz)     Intake/Output Summary (Last 24 hours) at 03/12/11 1006 Last data filed at 03/11/11 1700  Gross per 24 hour  Intake    480 ml  Output      0 ml  Net    480 ml    Exam Awake Alert, Oriented *3, No new F.N deficits, Normal affect [place].AT,PERRAL Supple Neck,No JVD, No cervical lymphadenopathy appriciated.  Symmetrical Chest wall movement, Good air movement bilaterally, CTAB RRR,No Gallops,Rubs or new Murmurs, No Parasternal Heave +ve B.Sounds, Abd Soft, Non tender, No organomegaly appriciated, No rebound -guarding or rigidity. No Cyanosis, Clubbing or edema, No new Rash or bruise    Data Review  CBC  Lab 03/12/11 0859 03/11/11 0555 03/10/11 0715 03/09/11 1226 03/08/11 1531 03/08/11 0515  WBC -- 8.0 9.3 9.5 10.8* 10.9*  HGB 10.8* 10.3* 10.2* 7.4* 8.3*8.2* --  HCT 32.4* 29.5* 29.3* 21.8* 23.9*23.9* --  PLT -- 298 219 196 156 137*  MCV -- 88.1 87.7 87.9 87.2 85.8  MCH -- 30.7 30.5 29.8 29.9 29.4  MCHC -- 34.9 34.8 33.9 34.3 34.3  RDW -- 15.5 15.1 16.0* 16.2* 15.8*  LYMPHSABS -- -- -- -- -- --  MONOABS -- -- -- -- -- --  EOSABS -- -- -- -- -- --  BASOSABS -- -- -- -- -- --  BANDABS -- -- -- -- -- --    Chemistries   Lab 03/11/11 0555 03/10/11 0715 03/09/11 0430 03/08/11  0515 03/07/11 0515 03/06/11 0545 03/05/11 2240  NA 143 139 141 144 142 -- --  K 3.3* 4.1 4.3 4.2 4.4 -- --  CL 105 108 115* 117* 116* -- --  CO2 28 26 21 22 21  -- --  GLUCOSE 86 98 65* 84 91 -- --  BUN 7 6 5* 11 14 -- --  CREATININE 0.95 1.10 0.84 0.89 0.92 -- --  CALCIUM 8.9 8.8 8.4 8.0* 7.8* -- --  MG -- -- -- -- -- 1.9 --  AST -- -- -- -- -- -- 11  ALT -- -- -- -- -- -- 8  ALKPHOS -- -- -- -- -- -- 61  BILITOT -- -- -- -- -- -- 0.3   ------------------------------------------------------------------------------------------------------------------ estimated creatinine clearance is 49 ml/min (by C-G formula based on Cr of 0.95). ------------------------------------------------------------------------------------------------------------------ No results found for this basename: HGBA1C:2 in the last 72 hours ------------------------------------------------------------------------------------------------------------------ No results found for this basename: CHOL:2,HDL:2,LDLCALC:2,TRIG:2,CHOLHDL:2,LDLDIRECT:2 in the last 72 hours ------------------------------------------------------------------------------------------------------------------ No results found for this basename: TSH,T4TOTAL,FREET3,T3FREE,THYROIDAB in the last 72 hours ------------------------------------------------------------------------------------------------------------------ No results found for this basename: VITAMINB12:2,FOLATE:2,FERRITIN:2,TIBC:2,IRON:2,RETICCTPCT:2 in the  last 72 hours  Coagulation profile  Lab 03/05/11 2240  INR 1.12  PROTIME --    No results found for this basename: DDIMER:2 in the last 72 hours  Cardiac Enzymes No results found for this basename: CK:3,CKMB:3,TROPONINI:3,MYOGLOBIN:3 in the last 168 hours ------------------------------------------------------------------------------------------------------------------ No components found with this basename: POCBNP:3  Micro  Results Recent Results (from the past 240 hour(s))  MRSA PCR SCREENING     Status: Normal   Collection Time   03/06/11  6:24 PM      Component Value Range Status Comment   MRSA by PCR NEGATIVE  NEGATIVE  Final     Radiology Reports Nm Gi Blood Loss  03/07/2011  *RADIOLOGY REPORT*  Clinical Data: Hematochezia and anemia requiring transfusion.  NUCLEAR MEDICINE GASTROINTESTINAL BLEEDING STUDY  Technique:  Sequential abdominal images were obtained following intravenous administration of Tc-56m labeled red blood cells.  Radiopharmaceutical: 25 mCi technetium 102m labeled red blood cells.  Comparison: CT of the abdomen pelvis dated 03/06/2011.  Findings: The first hour of imaging was negative for GI bleed. During a second hour of imaging, abnormal activity was localized to the region of the splenic flexure or descending colon.  There was transit of activity in both antegrade and retrograde directions.  IMPRESSION: Positive bleeding study during second hour of imaging with abnormal bleeding localizing to the colon at the level of the splenic flexure or descending colon.  Original Report Authenticated By: Reola Calkins, M.D.   Ct Abdomen Pelvis W Contrast  03/06/2011  *RADIOLOGY REPORT*  Clinical Data: Abdominal pain, bloody diarrhea  CT ABDOMEN AND PELVIS WITH CONTRAST  Technique:  Multidetector CT imaging of the abdomen and pelvis was performed following the standard protocol during bolus administration of intravenous contrast.  Contrast: 20mL OMNIPAQUE IOHEXOL 300 MG/ML IV SOLN, OMNIPAQUE IOHEXOL 300 MG/ML IV SOLN  Comparison: None.  Findings: Lung bases are unremarkable.  Sagittal images of the spine shows diffuse osteopenia.  Degenerative changes thoracolumbar spine.  Significant disc space flattening with vacuum disc phenomenon noted at the L4-L5 and L5-S1 level.  Extensive atherosclerotic calcifications of the abdominal aorta, bilateral proximal renal artery, SMA origin and bilateral iliac  arteries. Enhanced liver is unremarkable.  There is somewhat heterogeneous gallbladder without calcified gallstones identified.  Enhanced pancreas, spleen and adrenal glands are unremarkable. Enhanced kidneys are symmetrical in size.  Mild lobulated renal contour and mild renal cortical thinning there is a probable cyst in lower pole of the right kidney measures 9 mm.  Probable cyst in lower pole of the left kidney measures 1.9 cm.  Small cyst mid pole of the left kidney measures 6 mm.  Probable cyst in mid pole of the right kidney posterior aspect measures 1.2 cm.  No hydronephrosis or hydroureter.  No small bowel obstruction.  No ascites or free air.  No adenopathy.  There is no pericecal inflammation.  Normal appendix is clearly visualized in axial image 63.  Mild gaseous distention sigmoid colon.  Left colon and sigmoid colon diverticula are noted without evidence of acute diverticulitis.  Scattered diverticula are noted in the right colon and transverse colon.  The uterus is atrophic.  In axial image 84 there is nonspecific thickening of the anal wall. Measures at least 1.3 cm in thickness.  High density material probable blood products noted in the region of the anal lumen/anal sphincter. Measures about 1.8 cm.  Correlation with clinical exam is recommended to exclude bleeding hemorrhoids, bleeding inflammation /anal fissure or neoplastic process.  Delayed renal  images shows bilateral renal excretion.  The bilateral visualized proximal ureter is unremarkable.  IMPRESSION:  1. In axial image 84 there is nonspecific thickening of the anal wall.  Measures at least 1.3 cm in thickness.  High density material probable blood products noted in the region of the anal lumen/anal sphincter. Measures about 1.8 cm.  Correlation with clinical exam is recommended to exclude bleeding hemorrhoids, bleeding inflammation /anal fissure or neoplastic process. 2.  Colonic diverticulosis without evidence of acute diverticulitis. 3.   No small bowel obstruction.  No ascites or free air. 4.  Bilateral probable renal cysts.  No hydronephrosis or hydroureter. 5.  No pericecal inflammation.  Normal appendix is clearly visualized. 6.  Extensive atherosclerotic vascular calcifications. 7. Degenerative changes thoracolumbar spine.  Original Report Authenticated By: Natasha Mead, M.D.    Scheduled Meds:    . potassium chloride  40 mEq Oral Once   Continuous Infusions:    . sodium chloride     PRN Meds:.acetaminophen, acetaminophen, albuterol-ipratropium, alum & mag hydroxide-simeth, ondansetron (ZOFRAN) IV, ondansetron, oxyCODONE, zolpidem  Assessment & Plan   Anemia from Diverticular Bleed- Stable post transfusion, ASA held, outpt GI follow.   Shortness of Breath -post transfusion due to mild fluid OD, Ok at rest, Pox 80% off o2 when walking upto 90% with 2lit [place], will check CXR, Echo, CTA, Nebs, Pox monitor, o2 Rx.   GERD - continue protonix    Acute kidney injury prerenal  resolved    HTN (hypertension) - at goal    Dizziness upon standing - not orthostatic, place on tele, gentle IVF as BP drops some, TEDs, in chair in am,     Pt now [facility] for rehab will try to send her to rehab, daughter OK.  DVT Prophylaxis SCDs  See all Orders from today for further details    Leroy Sea M.D on 03/12/2011 at 10:06 AM  [facility] Office  [phone]

## 2011-03-12 NOTE — Progress Notes (Signed)
  Echocardiogram 2D Echocardiogram has been performed.  Clide Deutscher RDCS 03/12/2011, 4:36 PM

## 2011-03-12 NOTE — Progress Notes (Signed)
.  Clinical social worker completed patient psychosocial assessment, please see assessment in patient shadow chart.  Pt is open to short term rehab after experiencing some deconditioning while being hospitalized. Pt family is very supportive and assisting with pt dc plans. .Clinical social worker initiated skilled nursing facility search, see placement note in patient shadow chart. Pt is most interested in [place], [place], and [place] [facility] for short term rehab. CSW will complete fl2 for md signature, please see pt shadow chart.   Catha Gosselin, Theresia Majors  [phone] .03/12/2011 14:15pm

## 2011-03-12 NOTE — Progress Notes (Signed)
Occupational Therapy Evaluation Patient Details Name: [name] MRN: [id number] DOB: 18-Jun-1929 Today's Date: 03/12/2011  Problem List:  Patient Active Problem List  Diagnoses  . MALLORY-WEISS SYNDROME  . GERD  . HIATAL HERNIA  . Hematochezia  . Acute kidney injury  . HTN (hypertension)  . Anemia due to blood loss, acute    Past Medical History:  Past Medical History  Diagnosis Date  . Asthma   . Shortness of breath   . Hypertension   . Blood transfusion    Past Surgical History:  Past Surgical History  Procedure Date  . Colonoscopy 03/08/2011    Procedure: COLONOSCOPY;  Surgeon: Erick Blinks, MD;  Location: [facility] ENDOSCOPY;  Service: Gastroenterology;  Laterality: N/A;    OT Assessment/Plan/Recommendation OT Assessment Clinical Impression Statement: [name] is an 78 y.o. female who presented to  The ED with complaints of sudden onset of crampy generalized abdominal pain followed by bright red blood per rectum X 5 episodes.  She state she has subsequent symptoms of SOB, weakness and dizziness.  EMS was called and when she arrived in the ED she was found to be hypotensive with systolic Blood Pressure of 66.  She was also found to have abnormal labs with a K+ level of 2.5.  Patient is currently overall min-mod assist for BADL tasks, balance deficits, toilet transfers, and  c/o lightheadedness with stand and ambulation <> bathroom and sink and requires cues for cognitive deficits (especially with memory).  Patient's SpO2>/+ 95% on room air at rest and with ambulation to bathroom and to sink.  Based on my assessment of BADL and discussion with patient's daughter, patient is not safe to be home alone unless 24/7 assistance and supervision can be provided.  Daughter states this cannot be provided.  Recommend SNF   OT Recommendation/Assessment: Patient will need skilled OT in the acute care venue OT Problem List: Decreased strength;Decreased range of motion;Decreased activity  tolerance;Impaired balance (sitting and/or standing);Decreased cognition;Decreased safety awareness Barriers to Discharge: Decreased caregiver support OT Therapy Diagnosis : Generalized weakness;Cognitive deficits;Acute pain OT Plan OT Frequency: Min 2X/week OT Treatment/Interventions: Self-care/ADL training;Energy conservation;DME and/or AE instruction;Patient/family education;Balance training;Therapeutic exercise OT Recommendation Follow Up Recommendations: 24 hour supervision/assistance;Skilled nursing facility Equipment Recommended: Defer to next venue Individuals Consulted Consulted and Agree with Results and Recommendations: Patient;Family member/caregiver Family Member Consulted: Daughter OT Goals Acute Rehab OT Goals OT Goal Formulation: With patient Time For Goal Achievement: 7 days ADL Goals Pt Will Perform Upper Body Bathing: Sitting in shower;with supervision ADL Goal: Upper Body Bathing - Progress: Progressing toward goals Pt Will Perform Lower Body Bathing: Sit to stand in shower;with min assist ADL Goal: Lower Body Bathing - Progress: Progressing toward goals Pt Will Perform Upper Body Dressing: with supervision;Sitting, chair ADL Goal: Upper Body Dressing - Progress: Progressing toward goals Pt Will Perform Lower Body Dressing: with supervision;Sit to stand from chair ADL Goal: Lower Body Dressing - Progress: Progressing toward goals Pt Will Transfer to Toilet: with supervision;Grab bars;Comfort height toilet ADL Goal: Toilet Transfer - Progress: Progressing toward goals Pt Will Perform Toileting - Clothing Manipulation: with supervision;Standing ADL Goal: Toileting - Clothing Manipulation - Progress: Progressing toward goals Pt Will Perform Toileting - Hygiene: with supervision;Sit to stand from 3-in-1/toilet ADL Goal: Toileting - Hygiene - Progress: Progressing toward goals Pt Will Perform Tub/Shower Transfer: with min assist;Shower seat with back ADL Goal:  Tub/Shower Transfer - Progress: Progressing toward goals  OT Evaluation Precautions/Restrictions  Precautions Precautions: Fall Precaution Comments: "I'm a  little dizzy" in standing and with ambulating with RW <> bathroom Restrictions Weight Bearing Restrictions: No Prior Functioning Home Living Lives With: Alone Receives Help From: Family;Other (Comment) ("my family checks in om me sometimes") Type of Home: Apartment (senior living) Home Access: Engineer, maintenance (IT) Shower/Tub: Heritage manager Toilet: Handicapped height Home Adaptive Equipment: Grab bars around toilet;Shower chair with back;Walker - rolling;Grab bars in shower Prior Function Level of Independence: Independent with basic ADLs;Independent with homemaking with ambulation;Independent with transfers Comments: Pt reports Independence with BADL alone at home however, suspect needed more supervision than was provided. ADL ADL Grooming: Wash/dry hands;Supervision/safety Grooming Details (indicate cue type and reason): standing at sink requiring close supervision secondary to reports dizzy. Where Assessed - Grooming: Standing at sink Upper Body Bathing: Simulated;Supervision/safety;Set up Where Assessed - Upper Body Bathing: Sitting, bed Lower Body Bathing: Simulated;Moderate assistance;Supervision/safety;Set up Where Assessed - Lower Body Bathing: Sitting, bed;Sit to stand from bed Upper Body Dressing: Simulated;Minimal assistance;Supervision/safety Where Assessed - Upper Body Dressing: Sitting, bed Lower Body Dressing: Moderate assistance;Simulated;Supervision/safety;Set up Where Assessed - Lower Body Dressing: Sitting, bed;Sit to stand from bed Toilet Transfer: Moderate assistance Toilet Transfer Method: Ambulating;Stand pivot Acupuncturist: Regular height toilet;Grab bars Toileting - Clothing Manipulation: Minimal assistance Where Assessed - Toileting Clothing Manipulation: Standing Toileting -  Hygiene: Minimal assistance Where Assessed - Toileting Hygiene: Standing Tub/Shower Transfer: Not assessed Equipment Used: Landscape architect Cognition Arousal/Alertness: Awake/alert Overall Cognitive Status: Impaired (no family or caregiver present to determine baseline) Memory: Appears impaired Memory Deficits: Patient often stating "I don't remember" and patient did not know why she was in the hospital, which side of the bed she gets up on, or what her kitchen looks like. Orientation Level: Oriented to place;Oriented to person;Disoriented to situation Extremity Assessment RUE Assessment RUE Assessment: Exceptions to [facility] RUE AROM (degrees) Overall AROM Right Upper Extremity: Deficits RUE Overall AROM Comments: Shoulder AROM ~100 degrees before patient reports "sore" RUE Strength RUE Overall Strength: Deficits RUE Overall Strength Comments: grossly 4/5 overall LUE Assessment LUE Assessment: Exceptions to WFL LUE AROM (degrees) Overall AROM Left Upper Extremity: Deficits LUE Overall AROM Comments: Shoulder AROM ~100 degrees before patient reports "sore" LUE Strength LUE Overall Strength: Deficits LUE Overall Strength Comments: grossly 4/5 overall Mobility  Bed Mobility Bed Mobility: Yes Rolling Left: 4: Min assist Left Sidelying to Sit: 4: Min assist Transfers Transfers: Yes Sit to Stand: 4: Min assist (min assist from elevated surface) Stand to Sit: 4: Min assist (min assist from elevated surface) End of Session OT - End of Session Equipment Utilized During Treatment: Gait belt Activity Tolerance: Patient limited by fatigue Patient left: in bed;with call bell in reach General Behavior During Session: [facility] for tasks performed   Yolanda Huffstetler 03/12/2011, 11:46 AM

## 2011-03-12 NOTE — Consult Note (Signed)
[facility]ist [facility]  Vascular Consult Note    Patient name: [name] MRN: [id number] DOB: 1929/08/09 Sex: female     Reason for referral: Thoracic aortic plaque on CT scan  HISTORY OF PRESENT ILLNESS: The patient is admitted to the hospital for GI bleed. She had an episode of shortness of breath during the admission and had a CT scan to rule out pulmonary embolus. This was negative for pulmonary embolus but she did have an incidental finding of plaque in her descending aorta and I was consult and for further discussion of this. The patient has no prior history of atheroembolic disease related to this and no evidence of arterial insufficiency. She has no history of stroke.  Past Medical History  Diagnosis Date  . Asthma   . Shortness of breath   . Hypertension   . Blood transfusion     Past Surgical History  Procedure Date  . Colonoscopy 03/08/2011    Procedure: COLONOSCOPY;  Surgeon: Erick Blinks, MD;  Location: [facility] ENDOSCOPY;  Service: Gastroenterology;  Laterality: N/A;    History   Social History  . Marital Status: Widowed    Spouse Name: N/A    Number of Children: N/A  . Years of Education: N/A   Occupational History  . Not on file.   Social History Main Topics  . Smoking status: Former Smoker -- 1.0 packs/day    Types: Cigarettes  . Smokeless tobacco: Never Used  . Alcohol Use: No  . Drug Use: No  . Sexually Active: No   Other Topics Concern  . Not on file   Social History Narrative  . No narrative on file    History reviewed. No pertinent family history.  Allergies  Allergen Reactions  . Penicillins Other (See Comments)    unknown    Prior to Admission medications   Medication Sig Start Date End Date Taking? Authorizing Provider  aspirin 81 MG chewable tablet Chew 81 mg by mouth daily.     Yes Historical Provider, MD     REVIEW OF SYSTEMS: Cardiovascular: No chest pain, chest pressure, palpitations, orthopnea, or  dyspnea on exertion. No claudication or rest pain,  No history of DVT or phlebitis. Pulmonary: No productive cough, asthma or wheezing. Neurologic: No weakness, paresthesias, aphasia, or amaurosis. No dizziness. Hematologic: No bleeding problems or clotting disorders. Musculoskeletal: No joint pain or joint swelling. Gastrointestinal: No blood in stool or hematemesis Genitourinary: No dysuria or hematuria. Psychiatric:: No history of major depression. Integumentary: No rashes or ulcers. Constitutional: No fever or chills.  PHYSICAL EXAMINATION:  Filed Vitals:   03/12/11 1222  BP: 147/90  Pulse: 77  Temp:   Resp: 18    General: The patient appears their stated age. Pulmonary: There is a good air exchange bilaterally without wheezing or rales.  Musculoskeletal: There are no major deformities.  There is no significant extremity pain. Neurologic: No focal weakness or paresthesias are detected, Skin: There are no ulcer or rashes noted. Psychiatric: The patient has normal affect. Cardiovascular: 2+ dorsalis pedis pulses bilaterally, palpable 2+ radial pulses bilaterally  Diagnostic Studies: CT scan was reviewed and discussed with the patient. This shows calcified plaque in the transverse arch and descending arch. There is also some plaque in the origin of the great vessels and also some plaque at the level of her mesenteric vessels. No evidence of dissection or stenosis.     Medication Changes: None  Assessment:  Incidental finding of aortic plaque in transverse  and descending thoracic aorta. No evidence of dissection or stenosis  Plan: No further evaluation or followup required. I did discuss this with the patient who understands  Rushi Chasen F 1/4/20134:53 PM

## 2011-03-13 LAB — BASIC METABOLIC PANEL
Calcium: 8.7 mg/dL (ref 8.4–10.5)
GFR calc Af Amer: 75 mL/min — ABNORMAL LOW (ref >90)
GFR calc non Af Amer: 64 mL/min — ABNORMAL LOW (ref >90)
Potassium: 3.3 mEq/L — ABNORMAL LOW (ref 3.5–5.1)
Sodium: 143 mEq/L (ref 135–145)

## 2011-03-13 LAB — CBC
Platelets: 362 10*3/uL (ref 150–400)
RBC: 3.4 MIL/uL — ABNORMAL LOW (ref 3.87–5.11)
RDW: 15.3 % (ref 11.5–15.5)
WBC: 9 10*3/uL (ref 4.0–10.5)

## 2011-03-13 LAB — MAGNESIUM: Magnesium: 1.7 mg/dL (ref 1.5–2.5)

## 2011-03-13 MED ORDER — POTASSIUM CHLORIDE CRYS ER 20 MEQ PO TBCR
40.0000 meq | EXTENDED_RELEASE_TABLET | Freq: Once | ORAL | Status: AC
  Administered 2011-03-13: 40 meq via ORAL
  Filled 2011-03-13 (×2): qty 2

## 2011-03-13 MED ORDER — FUROSEMIDE 10 MG/ML IJ SOLN
40.0000 mg | Freq: Once | INTRAMUSCULAR | Status: AC
  Administered 2011-03-13: 40 mg via INTRAVENOUS
  Filled 2011-03-13: qty 4

## 2011-03-13 MED ORDER — POTASSIUM CHLORIDE CRYS ER 20 MEQ PO TBCR
40.0000 meq | EXTENDED_RELEASE_TABLET | Freq: Once | ORAL | Status: AC
  Administered 2011-03-13: 40 meq via ORAL

## 2011-03-13 MED ORDER — FUROSEMIDE 40 MG PO TABS
40.0000 mg | ORAL_TABLET | Freq: Once | ORAL | Status: DC
  Filled 2011-03-13: qty 1

## 2011-03-13 MED ORDER — MAGNESIUM SULFATE IN D5W 10-5 MG/ML-% IV SOLN
1.0000 g | Freq: Once | INTRAVENOUS | Status: AC
  Administered 2011-03-13: 1 g via INTRAVENOUS
  Filled 2011-03-13: qty 100

## 2011-03-13 NOTE — Progress Notes (Signed)
Georgeanne Frankland CSN:[id number],MRN:[id number] is a 78 y.o. female,  Outpatient Primary MD for the patient is No primary provider on file.  Chief Complaint  Patient presents with  . GI Bleeding        Subjective:   [name] today has, No headache, No chest pain, No abdominal pain - No Nausea, No new weakness tingling or numbness, No Cough - SOB.   Objective:   Filed Vitals:   03/13/11 0424 03/13/11 0425 03/13/11 0426 03/13/11 0800  BP: 144/81 143/78 135/67   Pulse: 72 72 76   Temp: 98.4 F (36.9 C)     TempSrc: Oral     Resp: 20 22 22    Height:      Weight:      SpO2: 100%   99%    Wt Readings from Last 3 Encounters:  03/06/11 76.1 kg (167 lb 12.3 oz)  03/06/11 76.1 kg (167 lb 12.3 oz)     Intake/Output Summary (Last 24 hours) at 03/13/11 1311 Last data filed at 03/13/11 0700  Gross per 24 hour  Intake 2103.67 ml  Output      0 ml  Net 2103.67 ml    Exam Awake Alert, Oriented *3, No new F.N deficits, Normal affect [place].AT,PERRAL Supple Neck,No JVD, No cervical lymphadenopathy appriciated.  Symmetrical Chest wall movement, Good air movement bilaterally, CTAB RRR,No Gallops,Rubs or new Murmurs, No Parasternal Heave +ve B.Sounds, Abd Soft, Non tender, No organomegaly appriciated, No rebound -guarding or rigidity. No Cyanosis, Clubbing or edema, No new Rash or bruise    Data Review  CBC  Lab 03/13/11 0909 03/12/11 0859 03/11/11 0555 03/10/11 0715 03/09/11 1226 03/08/11 1531  WBC 9.0 -- 8.0 9.3 9.5 10.8*  HGB 10.1* 10.8* 10.3* 10.2* 7.4* --  HCT 30.9* 32.4* 29.5* 29.3* 21.8* --  PLT 362 -- 298 219 196 156  MCV 90.9 -- 88.1 87.7 87.9 87.2  MCH 29.7 -- 30.7 30.5 29.8 29.9  MCHC 32.7 -- 34.9 34.8 33.9 34.3  RDW 15.3 -- 15.5 15.1 16.0* 16.2*  LYMPHSABS -- -- -- -- -- --  MONOABS -- -- -- -- -- --  EOSABS -- -- -- -- -- --  BASOSABS -- -- -- -- -- --  BANDABS -- -- -- -- -- --    Chemistries   Lab 03/13/11 0610 03/12/11 0859 03/11/11 0555 03/10/11 0715  03/09/11 0430 03/08/11 0515  NA 143 -- 143 139 141 144  K 3.3* 3.3* 3.3* 4.1 4.3 --  CL 107 -- 105 108 115* 117*  CO2 28 -- 28 26 21 22   GLUCOSE 87 -- 86 98 65* 84  BUN 7 -- 7 6 5* 11  CREATININE 0.84 -- 0.95 1.10 0.84 0.89  CALCIUM 8.7 -- 8.9 8.8 8.4 8.0*  MG 1.7 -- -- -- -- --  AST -- -- -- -- -- --  ALT -- -- -- -- -- --  ALKPHOS -- -- -- -- -- --  BILITOT -- -- -- -- -- --   ------------------------------------------------------------------------------------------------------------------ estimated creatinine clearance is 55.4 ml/min (by C-G formula based on Cr of 0.84). ------------------------------------------------------------------------------------------------------------------ No results found for this basename: HGBA1C:2 in the last 72 hours ------------------------------------------------------------------------------------------------------------------ No results found for this basename: CHOL:2,HDL:2,LDLCALC:2,TRIG:2,CHOLHDL:2,LDLDIRECT:2 in the last 72 hours ------------------------------------------------------------------------------------------------------------------ No results found for this basename: TSH,T4TOTAL,FREET3,T3FREE,THYROIDAB in the last 72 hours ------------------------------------------------------------------------------------------------------------------ No results found for this basename: VITAMINB12:2,FOLATE:2,FERRITIN:2,TIBC:2,IRON:2,RETICCTPCT:2 in the last 72 hours  Coagulation profile No results found for this basename: INR:5,PROTIME:5 in the last 168 hours  No results  found for this basename: DDIMER:2 in the last 72 hours  Cardiac Enzymes No results found for this basename: CK:3,CKMB:3,TROPONINI:3,MYOGLOBIN:3 in the last 168 hours ------------------------------------------------------------------------------------------------------------------ No components found with this basename: POCBNP:3  Micro Results Recent Results (from the past 240  hour(s))  MRSA PCR SCREENING     Status: Normal   Collection Time   03/06/11  6:24 PM      Component Value Range Status Comment   MRSA by PCR NEGATIVE  NEGATIVE  Final     Radiology Reports Nm Gi Blood Loss  03/07/2011  *RADIOLOGY REPORT*  Clinical Data: Hematochezia and anemia requiring transfusion.  NUCLEAR MEDICINE GASTROINTESTINAL BLEEDING STUDY  Technique:  Sequential abdominal images were obtained following intravenous administration of Tc-29m labeled red blood cells.  Radiopharmaceutical: 25 mCi technetium 24m labeled red blood cells.  Comparison: CT of the abdomen pelvis dated 03/06/2011.  Findings: The first hour of imaging was negative for GI bleed. During a second hour of imaging, abnormal activity was localized to the region of the splenic flexure or descending colon.  There was transit of activity in both antegrade and retrograde directions.  IMPRESSION: Positive bleeding study during second hour of imaging with abnormal bleeding localizing to the colon at the level of the splenic flexure or descending colon.  Original Report Authenticated By: Reola Calkins, M.D.   Ct Abdomen Pelvis W Contrast  03/06/2011  *RADIOLOGY REPORT*  Clinical Data: Abdominal pain, bloody diarrhea  CT ABDOMEN AND PELVIS WITH CONTRAST  Technique:  Multidetector CT imaging of the abdomen and pelvis was performed following the standard protocol during bolus administration of intravenous contrast.  Contrast: 20mL OMNIPAQUE IOHEXOL 300 MG/ML IV SOLN, OMNIPAQUE IOHEXOL 300 MG/ML IV SOLN  Comparison: None.  Findings: Lung bases are unremarkable.  Sagittal images of the spine shows diffuse osteopenia.  Degenerative changes thoracolumbar spine.  Significant disc space flattening with vacuum disc phenomenon noted at the L4-L5 and L5-S1 level.  Extensive atherosclerotic calcifications of the abdominal aorta, bilateral proximal renal artery, SMA origin and bilateral iliac arteries. Enhanced liver is unremarkable.   There is somewhat heterogeneous gallbladder without calcified gallstones identified.  Enhanced pancreas, spleen and adrenal glands are unremarkable. Enhanced kidneys are symmetrical in size.  Mild lobulated renal contour and mild renal cortical thinning there is a probable cyst in lower pole of the right kidney measures 9 mm.  Probable cyst in lower pole of the left kidney measures 1.9 cm.  Small cyst mid pole of the left kidney measures 6 mm.  Probable cyst in mid pole of the right kidney posterior aspect measures 1.2 cm.  No hydronephrosis or hydroureter.  No small bowel obstruction.  No ascites or free air.  No adenopathy.  There is no pericecal inflammation.  Normal appendix is clearly visualized in axial image 63.  Mild gaseous distention sigmoid colon.  Left colon and sigmoid colon diverticula are noted without evidence of acute diverticulitis.  Scattered diverticula are noted in the right colon and transverse colon.  The uterus is atrophic.  In axial image 84 there is nonspecific thickening of the anal wall. Measures at least 1.3 cm in thickness.  High density material probable blood products noted in the region of the anal lumen/anal sphincter. Measures about 1.8 cm.  Correlation with clinical exam is recommended to exclude bleeding hemorrhoids, bleeding inflammation /anal fissure or neoplastic process.  Delayed renal images shows bilateral renal excretion.  The bilateral visualized proximal ureter is unremarkable.  IMPRESSION:  1. In axial image 84  there is nonspecific thickening of the anal wall.  Measures at least 1.3 cm in thickness.  High density material probable blood products noted in the region of the anal lumen/anal sphincter. Measures about 1.8 cm.  Correlation with clinical exam is recommended to exclude bleeding hemorrhoids, bleeding inflammation /anal fissure or neoplastic process. 2.  Colonic diverticulosis without evidence of acute diverticulitis. 3.  No small bowel obstruction.  No ascites  or free air. 4.  Bilateral probable renal cysts.  No hydronephrosis or hydroureter. 5.  No pericecal inflammation.  Normal appendix is clearly visualized. 6.  Extensive atherosclerotic vascular calcifications. 7. Degenerative changes thoracolumbar spine.  Original Report Authenticated By: Natasha Mead, M.D.     CTA Chest  IMPRESSION:  1. No evidence of acute pulmonary embolism.  2. Atheromatous plaque within the aortic arch and descending  thoracic aorta.  3. Stable scarring in the lingula.   Echo  - Left ventricle: The cavity size was normal. Wall thickness was increased in a pattern of moderate LVH. The estimated ejection fraction was 60%. Wall motion was normal; there were no regional wall motion abnormalities. Doppler parameters are consistent with abnormal left ventricular relaxation (grade 1 diastolic dysfunction).  - Aortic valve: Mildly calcified leaflets. There was moderate stenosis. Trivial regurgitation. Mean gradient: 26mm Hg (S). Peak gradient: 40mm Hg (S).  - Mitral valve: Calcified annulus.  - Right atrium: The atrium was mildly dilated.     Scheduled Meds:    . albuterol  2.5 mg Nebulization Q8H  . furosemide  40 mg Oral Once  . magnesium sulfate 1 - 4 g bolus IVPB  1 g Intravenous Once  . potassium chloride  40 mEq Oral Once   Continuous Infusions:    . sodium chloride 100 mL/hr at 03/12/11 1211   PRN Meds:.acetaminophen, acetaminophen, albuterol-ipratropium, alum & mag hydroxide-simeth, ondansetron (ZOFRAN) IV, ondansetron, oxyCODONE, zolpidem  Assessment & Plan   Anemia from Diverticular Bleed- Stable post transfusion, ASA held, outpt GI follow.   Shortness of Breath ? Mild Acute on Chr Diast  CHF  -post transfusion due to mild fluid OD, Ok at rest, Pox 80% off o2 when walking upto 90% with 2lit [place], will check CXR, Echo & CTA stable, Nebs, Pox monitor, o2 Rx.  Lasix 1 time. CXR stable.   GERD - continue protonix    Acute kidney injury prerenal   resolved    HTN (hypertension) - at goal    Dizziness upon standing - not orthostatic, symptomatic, CTA-Echo stable, likely from deconditioning, TEDs, gentle activity with fall precautions & walker, PT.   Low K  replaced with Mag.   Pt now [facility] for rehab will try to send her to rehab, daughter OK. Await placement  DVT Prophylaxis SCDs  See all Orders from today for further details    Leroy Sea M.D on 03/13/2011 at 1:11 PM  [facility] Office  [phone]

## 2011-03-14 LAB — BASIC METABOLIC PANEL
CO2: 31 mEq/L (ref 19–32)
Chloride: 108 mEq/L (ref 96–112)
Creatinine, Ser: 0.87 mg/dL (ref 0.50–1.10)
GFR calc Af Amer: 72 mL/min — ABNORMAL LOW (ref >90)
Sodium: 145 mEq/L (ref 135–145)

## 2011-03-14 MED ORDER — FUROSEMIDE 40 MG PO TABS
40.0000 mg | ORAL_TABLET | Freq: Once | ORAL | Status: AC
  Administered 2011-03-14: 40 mg via ORAL
  Filled 2011-03-14: qty 1

## 2011-03-14 MED ORDER — MAGNESIUM SULFATE IN D5W 10-5 MG/ML-% IV SOLN
1.0000 g | Freq: Once | INTRAVENOUS | Status: AC
  Administered 2011-03-14: 1 g via INTRAVENOUS
  Filled 2011-03-14: qty 100

## 2011-03-14 NOTE — Progress Notes (Addendum)
[name] CSN:[id number],MRN:[id number] is a 78 y.o. female,  Outpatient Primary MD for the patient is No primary provider on file.  Chief Complaint  Patient presents with  . GI Bleeding        Subjective:   [name] today has, No headache, No chest pain, No abdominal pain - No Nausea, No new weakness tingling or numbness, No Cough - SOB.   Objective:   Filed Vitals:   03/14/11 0500 03/14/11 0830 03/14/11 0853 03/14/11 1200  BP: 134/77 130/80  140/85  Pulse: 69 70  72  Temp: 98.8 F (37.1 C) 98 F (36.7 C)  98 F (36.7 C)  TempSrc: Oral Oral  Oral  Resp: 18 18  20   Height:      Weight:      SpO2: 100% 100% 93% 98%    Wt Readings from Last 3 Encounters:  03/06/11 76.1 kg (167 lb 12.3 oz)  03/06/11 76.1 kg (167 lb 12.3 oz)     Intake/Output Summary (Last 24 hours) at 03/14/11 1315 Last data filed at 03/13/11 1630  Gross per 24 hour  Intake    100 ml  Output      0 ml  Net    100 ml    Exam Awake Alert, Oriented *3, No new F.N deficits, Normal affect [place].AT,PERRAL Supple Neck,No JVD, No cervical lymphadenopathy appriciated.  Symmetrical Chest wall movement, Good air movement bilaterally, CTAB RRR,No Gallops,Rubs or new Murmurs, No Parasternal Heave +ve B.Sounds, Abd Soft, Non tender, No organomegaly appriciated, No rebound -guarding or rigidity. No Cyanosis, Clubbing or edema, No new Rash or bruise    Data Review  CBC  Lab 03/13/11 0909 03/12/11 0859 03/11/11 0555 03/10/11 0715 03/09/11 1226 03/08/11 1531  WBC 9.0 -- 8.0 9.3 9.5 10.8*  HGB 10.1* 10.8* 10.3* 10.2* 7.4* --  HCT 30.9* 32.4* 29.5* 29.3* 21.8* --  PLT 362 -- 298 219 196 156  MCV 90.9 -- 88.1 87.7 87.9 87.2  MCH 29.7 -- 30.7 30.5 29.8 29.9  MCHC 32.7 -- 34.9 34.8 33.9 34.3  RDW 15.3 -- 15.5 15.1 16.0* 16.2*  LYMPHSABS -- -- -- -- -- --  MONOABS -- -- -- -- -- --  EOSABS -- -- -- -- -- --  BASOSABS -- -- -- -- -- --  BANDABS -- -- -- -- -- --    Chemistries   Lab 03/14/11 0500  03/13/11 0610 03/12/11 0859 03/11/11 0555 03/10/11 0715 03/09/11 0430  NA 145 143 -- 143 139 141  K 3.9 3.3* 3.3* 3.3* 4.1 --  CL 108 107 -- 105 108 115*  CO2 31 28 -- 28 26 21   GLUCOSE 89 87 -- 86 98 65*  BUN 7 7 -- 7 6 5*  CREATININE 0.87 0.84 -- 0.95 1.10 0.84  CALCIUM 9.2 8.7 -- 8.9 8.8 8.4  MG -- 1.7 -- -- -- --  AST -- -- -- -- -- --  ALT -- -- -- -- -- --  ALKPHOS -- -- -- -- -- --  BILITOT -- -- -- -- -- --   ------------------------------------------------------------------------------------------------------------------ estimated creatinine clearance is 53.5 ml/min (by C-G formula based on Cr of 0.87). ------------------------------------------------------------------------------------------------------------------ No results found for this basename: HGBA1C:2 in the last 72 hours ------------------------------------------------------------------------------------------------------------------ No results found for this basename: CHOL:2,HDL:2,LDLCALC:2,TRIG:2,CHOLHDL:2,LDLDIRECT:2 in the last 72 hours ------------------------------------------------------------------------------------------------------------------ No results found for this basename: TSH,T4TOTAL,FREET3,T3FREE,THYROIDAB in the last 72 hours ------------------------------------------------------------------------------------------------------------------ No results found for this basename: VITAMINB12:2,FOLATE:2,FERRITIN:2,TIBC:2,IRON:2,RETICCTPCT:2 in the last 72 hours  Coagulation profile No results found  for this basename: INR:5,PROTIME:5 in the last 168 hours  No results found for this basename: DDIMER:2 in the last 72 hours  Cardiac Enzymes No results found for this basename: CK:3,CKMB:3,TROPONINI:3,MYOGLOBIN:3 in the last 168 hours ------------------------------------------------------------------------------------------------------------------ No components found with this basename: POCBNP:3  Micro  Results Recent Results (from the past 240 hour(s))  MRSA PCR SCREENING     Status: Normal   Collection Time   03/06/11  6:24 PM      Component Value Range Status Comment   MRSA by PCR NEGATIVE  NEGATIVE  Final     Radiology Reports Nm Gi Blood Loss  03/07/2011  *RADIOLOGY REPORT*  Clinical Data: Hematochezia and anemia requiring transfusion.  NUCLEAR MEDICINE GASTROINTESTINAL BLEEDING STUDY  Technique:  Sequential abdominal images were obtained following intravenous administration of Tc-10m labeled red blood cells.  Radiopharmaceutical: 25 mCi technetium 50m labeled red blood cells.  Comparison: CT of the abdomen pelvis dated 03/06/2011.  Findings: The first hour of imaging was negative for GI bleed. During a second hour of imaging, abnormal activity was localized to the region of the splenic flexure or descending colon.  There was transit of activity in both antegrade and retrograde directions.  IMPRESSION: Positive bleeding study during second hour of imaging with abnormal bleeding localizing to the colon at the level of the splenic flexure or descending colon.  Original Report Authenticated By: Reola Calkins, M.D.   Ct Abdomen Pelvis W Contrast  03/06/2011  *RADIOLOGY REPORT*  Clinical Data: Abdominal pain, bloody diarrhea  CT ABDOMEN AND PELVIS WITH CONTRAST  Technique:  Multidetector CT imaging of the abdomen and pelvis was performed following the standard protocol during bolus administration of intravenous contrast.  Contrast: 20mL OMNIPAQUE IOHEXOL 300 MG/ML IV SOLN, OMNIPAQUE IOHEXOL 300 MG/ML IV SOLN  Comparison: None.  Findings: Lung bases are unremarkable.  Sagittal images of the spine shows diffuse osteopenia.  Degenerative changes thoracolumbar spine.  Significant disc space flattening with vacuum disc phenomenon noted at the L4-L5 and L5-S1 level.  Extensive atherosclerotic calcifications of the abdominal aorta, bilateral proximal renal artery, SMA origin and bilateral iliac  arteries. Enhanced liver is unremarkable.  There is somewhat heterogeneous gallbladder without calcified gallstones identified.  Enhanced pancreas, spleen and adrenal glands are unremarkable. Enhanced kidneys are symmetrical in size.  Mild lobulated renal contour and mild renal cortical thinning there is a probable cyst in lower pole of the right kidney measures 9 mm.  Probable cyst in lower pole of the left kidney measures 1.9 cm.  Small cyst mid pole of the left kidney measures 6 mm.  Probable cyst in mid pole of the right kidney posterior aspect measures 1.2 cm.  No hydronephrosis or hydroureter.  No small bowel obstruction.  No ascites or free air.  No adenopathy.  There is no pericecal inflammation.  Normal appendix is clearly visualized in axial image 63.  Mild gaseous distention sigmoid colon.  Left colon and sigmoid colon diverticula are noted without evidence of acute diverticulitis.  Scattered diverticula are noted in the right colon and transverse colon.  The uterus is atrophic.  In axial image 84 there is nonspecific thickening of the anal wall. Measures at least 1.3 cm in thickness.  High density material probable blood products noted in the region of the anal lumen/anal sphincter. Measures about 1.8 cm.  Correlation with clinical exam is recommended to exclude bleeding hemorrhoids, bleeding inflammation /anal fissure or neoplastic process.  Delayed renal images shows bilateral renal excretion.  The bilateral visualized  proximal ureter is unremarkable.  IMPRESSION:  1. In axial image 84 there is nonspecific thickening of the anal wall.  Measures at least 1.3 cm in thickness.  High density material probable blood products noted in the region of the anal lumen/anal sphincter. Measures about 1.8 cm.  Correlation with clinical exam is recommended to exclude bleeding hemorrhoids, bleeding inflammation /anal fissure or neoplastic process. 2.  Colonic diverticulosis without evidence of acute diverticulitis. 3.   No small bowel obstruction.  No ascites or free air. 4.  Bilateral probable renal cysts.  No hydronephrosis or hydroureter. 5.  No pericecal inflammation.  Normal appendix is clearly visualized. 6.  Extensive atherosclerotic vascular calcifications. 7. Degenerative changes thoracolumbar spine.  Original Report Authenticated By: Natasha Mead, M.D.     CTA Chest  IMPRESSION:  1. No evidence of acute pulmonary embolism.  2. Atheromatous plaque within the aortic arch and descending  thoracic aorta.  3. Stable scarring in the lingula.   Echo  - Left ventricle: The cavity size was normal. Wall thickness was increased in a pattern of moderate LVH. The estimated ejection fraction was 60%. Wall motion was normal; there were no regional wall motion abnormalities. Doppler parameters are consistent with abnormal left ventricular relaxation (grade 1 diastolic dysfunction).  - Aortic valve: Mildly calcified leaflets. There was moderate stenosis. Trivial regurgitation. Mean gradient: 26mm Hg (S). Peak gradient: 40mm Hg (S).  - Mitral valve: Calcified annulus.  - Right atrium: The atrium was mildly dilated.     Scheduled Meds:    . albuterol  2.5 mg Nebulization Q8H  . furosemide  40 mg Intravenous Once  . furosemide  40 mg Oral Once  . magnesium sulfate 1 - 4 g bolus IVPB  1 g Intravenous Once  . potassium chloride  40 mEq Oral Once  . potassium chloride  40 mEq Oral Once  . DISCONTD: furosemide  40 mg Oral Once   Continuous Infusions:   PRN Meds:.acetaminophen, acetaminophen, albuterol-ipratropium, alum & mag hydroxide-simeth, ondansetron (ZOFRAN) IV, ondansetron, oxyCODONE, zolpidem  Assessment & Plan   Anemia from Diverticular Bleed- Stable post transfusion, ASA held, outpt GI follow.   Shortness of Breath ? Mild Acute on Chr Diast  CHF  -  Ok at rest, Pox 86% off o2 when walking upto 90% with 2lit [place] on 03-14-11 will need home o2, stable CXR, Echo shows Diast CHF, CTA is stable, Nebs,  Pox monitor, o2 Rx.  Lasix 1 more dose, rales better.   GERD - continue protonix    Acute kidney injury prerenal  resolved    HTN (hypertension) - at goal    Dizziness upon standing - not orthostatic, improved symptomatically, CTA - Echo stable, likely from deconditioning, TEDs, gentle activity with fall precautions & walker, PT.   Low K  replaced - stable.   Pt now [facility] for rehab will try to send her to rehab, daughter OK. Await placement  DVT Prophylaxis SCDs  See all Orders from today for further details    Leroy Sea M.D on 03/14/2011 at 1:15 PM  [facility] Office  [phone]

## 2011-03-15 LAB — POTASSIUM: Potassium: 3.8 mEq/L (ref 3.5–5.1)

## 2011-03-15 LAB — MAGNESIUM: Magnesium: 2 mg/dL (ref 1.5–2.5)

## 2011-03-15 MED ORDER — FERROUS SULFATE 325 (65 FE) MG PO TABS: 325.0000 mg | ORAL_TABLET | Freq: Every day | ORAL | Status: DC

## 2011-03-15 MED ORDER — ALBUTEROL SULFATE (5 MG/ML) 0.5% IN NEBU: 2.5000 mg | INHALATION_SOLUTION | RESPIRATORY_TRACT | Status: DC | PRN

## 2011-03-15 MED ORDER — FUROSEMIDE 20 MG PO TABS: 20.0000 mg | ORAL_TABLET | Freq: Two times a day (BID) | ORAL | Status: DC

## 2011-03-15 MED ORDER — FOLIC ACID 1 MG PO TABS: 1.0000 mg | ORAL_TABLET | Freq: Every day | ORAL | Status: DC

## 2011-03-15 MED ORDER — METOPROLOL TARTRATE 25 MG PO TABS
25.0000 mg | ORAL_TABLET | Freq: Two times a day (BID) | ORAL | Status: DC
  Administered 2011-03-15: 25 mg via ORAL
  Filled 2011-03-15 (×2): qty 1

## 2011-03-15 MED ORDER — METOPROLOL TARTRATE 25 MG PO TABS: 25.0000 mg | ORAL_TABLET | Freq: Two times a day (BID) | ORAL | Status: DC

## 2011-03-15 NOTE — Progress Notes (Signed)
Physical Therapy Treatment Patient Details Name: [name] MRN: [id number] DOB: [birth year] Today's Date: 03/15/2011  PT Assessment/Plan  PT - Assessment/Plan Comments on Treatment Session: Spoke with patient and her daughter. Pt is hesitant about DCing to SNF but more open about the idea as she doesn't have supervision at home. Daugher states that she has talked to CSW re: faciilties.  PT Plan: Discharge plan needs to be updated PT Frequency: Min 3X/week Follow Up Recommendations: Skilled nursing facility Equipment Recommended: Defer to next venue PT Goals  Acute Rehab PT Goals PT Goal: Sit to Stand - Progress: Progressing toward goal PT Goal: Stand to Sit - Progress: Progressing toward goal PT Goal: Ambulate - Progress: Progressing toward goal  PT Treatment Precautions/Restrictions  Precautions Precautions: Fall Precaution Comments: Pt complains of being dizzy while up Restrictions Weight Bearing Restrictions: No Mobility (including Balance) Transfers Sit to Stand: 4: Min assist;From chair/3-in-1;With upper extremity assist Sit to Stand Details (indicate cue type and reason): A for initiation of stand and cues for safe technique.  Stand to Sit: 4: Min assist;To chair/3-in-1;With upper extremity assist Stand to Sit Details: A to sit slowly and control descent with use of UEs  Ambulation/Gait Ambulation/Gait Assistance: 4: Min assist Ambulation/Gait Assistance Details (indicate cue type and reason): A with balance and RW management. Cues to stand upright Ambulation Distance (Feet): 20 Feet Assistive device: Rolling walker Gait Pattern: Step-to pattern;Trunk flexed Gait velocity: decreased    Exercise    End of Session PT - End of Session Equipment Utilized During Treatment: Gait belt Activity Tolerance: Patient limited by fatigue Patient left: in chair;with call bell in reach;with family/visitor present Nurse Communication: Mobility status for transfers;Mobility  status for ambulation General Behavior During Session: [facility] for tasks performed Cognition: [facility] for tasks performed  Skylene Deremer, Adline Potter 03/15/2011, 12:50 PM

## 2011-03-15 NOTE — Progress Notes (Signed)
Clinical social worker assisted with patient discharge plans to skilled nursing facility, [facility]. Pt daughter prefers to provided patient transportation, and will give facility patient chart copy. .No further Clinical Social Work needs, signing off.    Catha Gosselin, Theresia Majors  [phone] .03/15/2011 13:26pm

## 2011-03-15 NOTE — Discharge Summary (Signed)
[name], 78 y.o., DOB December 21, 1929, MRN [id number]. Admission date: 03/05/2011 Discharge Date 03/15/2011 Primary MD No primary provider on file. Admitting Physician Ron Parker, MD  Admission Diagnosis  Hypotension [458] Lower GI bleed [578.9] Rectal Bleeding hematochezia, anemia, hypotension  Discharge Diagnosis   Principal Problem:  *Anemia due to blood loss, acute Active Problems:  GERD  Hematochezia  Acute kidney injury  HTN (hypertension)    Past Medical History  Diagnosis Date  . Asthma   . Shortness of breath   . Hypertension   . Blood transfusion     Past Surgical History  Procedure Date  . Colonoscopy 03/08/2011    Procedure: COLONOSCOPY;  Surgeon: Erick Blinks, MD;  Location: [facility] ENDOSCOPY;  Service: Gastroenterology;  Laterality: N/A;     Hospital Course See H&P, Labs, Consult and Test reports for all details in brief, patient was admitted for     Anemia from Diverticular Bleed- Stable post transfusion, ASA held, Severe diverticulosis on colonoscopy this admission. outpt GI follow. H&H stable, bleed resolved.  Shortness of Breath ? Mild Acute on Chr Diast CHF - Ok at rest, Pox 86% off o2 when walking upto 90% with 2lit [place] on 03-14-11 will need home o2, stable CXR, Echo shows Diast CHF, CTA is stable, Nebs, Pox monitor, o2 Rx. Lasix PO along with B blocker, monitor weight, BMP, rales and adjust Lasix accordingly. .   Acute kidney injury prerenal resolved    HTN (hypertension) - at goal low dose B blocker.   Chronic Dizziness upon standing per daughter for yrs - not orthostatic, improved symptomatically, CTA - Echo stable, likely from deconditioning, TEDs, gentle activity with fall precautions & walker, PT.   Plaque noted on CTA - seen by Dr early, no need for alarm, outpt follow, once H&H stable for 1-2 weeks and OK by GI resume ASA.    Consults  GI,  Vascular ,  Psych(competent to make decisions)  Significant Tests:  See full reports for all  details     Dg Chest 2 View  03/12/2011  *RADIOLOGY REPORT*  Clinical Data: Shortness of breath and weakness.  CHEST - 2 VIEW  Comparison: Multiple prior studies including the CTA of chest 03/12/2011 and prior chest x-ray 04/02/2010  Findings: Lungs are well expanded bilaterally without consolidative airspace disease or pleural effusions.  There is slight obscuration of the left cardiac border, consistent with known scarring in the lingula (reference CT scan 03/12/2011).  Pulmonary vasculature is normal.  Cardiomediastinal silhouette is within normal limits.  The aorta is slightly tortuous and heavily calcified.  IMPRESSION: 1. No radiographic evidence of acute cardiopulmonary disease. 2.  Atherosclerosis.  Original Report Authenticated By: Florencia Reasons, M.D.   Ct Angio Chest W/cm &/or Wo Cm  03/12/2011  **ADDENDUM** CREATED: 03/12/2011 14:51:10  The last sentence of the first paragraph under "findings" mentions no atheromatous plaque.  However there is moderate atheromatous plaque especially noted within the  aortic arch and descending thoracic aorta.  **END ADDENDUM** SIGNED BY: Colon Flattery. Gery Pray, M.D.    03/12/2011  *RADIOLOGY REPORT*  Clinical Data:  Shortness of breath, cough, GI bleed  CT ANGIOGRAPHY CHEST WITH CONTRAST  Technique:  Multidetector CT imaging of the chest was performed using the standard protocol during bolus administration of intravenous contrast.  Multiplanar CT image reconstructions including MIPs were obtained to evaluate the vascular anatomy.  Contrast: 80mL OMNIPAQUE IOHEXOL 350 MG/ML IV SOLN  Comparison:  Chest x-ray of 04/02/2010 and CT angio chest of  08/27/2009  Findings:  There is good opacification of the pulmonary arteries. There is no evidence of acute pulmonary embolism.  The thoracic aorta is not as well opacified but no acute abnormality is seen. The origins of the great vessels are patent.  Atheromatous changes are noted within the distal aortic arch and throughout the  thoracic aorta without atheromatous plaque present.  On the lung window images the scarring in the lingula appears stable. The small nodular opacity in the right upper lobe anteriorly is stable and most likely benign.  No suspicious pulmonary nodule is seen.  No infiltrate or effusion is noted. There are degenerative changes diffusely throughout the thoracic spine.  Review of the MIP images confirms the above findings.  IMPRESSION:  1.  No evidence of acute pulmonary embolism. 2.  Atheromatous plaque within the aortic arch and descending thoracic aorta. 3.  Stable scarring in the lingula.  Original Report Authenticated By: Juline Patch, M.D.   Nm Gi Blood Loss  03/07/2011  *RADIOLOGY REPORT*  Clinical Data: Hematochezia and anemia requiring transfusion.  NUCLEAR MEDICINE GASTROINTESTINAL BLEEDING STUDY  Technique:  Sequential abdominal images were obtained following intravenous administration of Tc-12m labeled red blood cells.  Radiopharmaceutical: 25 mCi technetium 40m labeled red blood cells.  Comparison: CT of the abdomen pelvis dated 03/06/2011.  Findings: The first hour of imaging was negative for GI bleed. During a second hour of imaging, abnormal activity was localized to the region of the splenic flexure or descending colon.  There was transit of activity in both antegrade and retrograde directions.  IMPRESSION: Positive bleeding study during second hour of imaging with abnormal bleeding localizing to the colon at the level of the splenic flexure or descending colon.  Original Report Authenticated By: Reola Calkins, M.D.   Ct Abdomen Pelvis W Contrast  03/06/2011  *RADIOLOGY REPORT*  Clinical Data: Abdominal pain, bloody diarrhea  CT ABDOMEN AND PELVIS WITH CONTRAST  Technique:  Multidetector CT imaging of the abdomen and pelvis was performed following the standard protocol during bolus administration of intravenous contrast.  Contrast: 20mL OMNIPAQUE IOHEXOL 300 MG/ML IV SOLN, OMNIPAQUE  IOHEXOL 300 MG/ML IV SOLN  Comparison: None.  Findings: Lung bases are unremarkable.  Sagittal images of the spine shows diffuse osteopenia.  Degenerative changes thoracolumbar spine.  Significant disc space flattening with vacuum disc phenomenon noted at the L4-L5 and L5-S1 level.  Extensive atherosclerotic calcifications of the abdominal aorta, bilateral proximal renal artery, SMA origin and bilateral iliac arteries. Enhanced liver is unremarkable.  There is somewhat heterogeneous gallbladder without calcified gallstones identified.  Enhanced pancreas, spleen and adrenal glands are unremarkable. Enhanced kidneys are symmetrical in size.  Mild lobulated renal contour and mild renal cortical thinning there is a probable cyst in lower pole of the right kidney measures 9 mm.  Probable cyst in lower pole of the left kidney measures 1.9 cm.  Small cyst mid pole of the left kidney measures 6 mm.  Probable cyst in mid pole of the right kidney posterior aspect measures 1.2 cm.  No hydronephrosis or hydroureter.  No small bowel obstruction.  No ascites or free air.  No adenopathy.  There is no pericecal inflammation.  Normal appendix is clearly visualized in axial image 63.  Mild gaseous distention sigmoid colon.  Left colon and sigmoid colon diverticula are noted without evidence of acute diverticulitis.  Scattered diverticula are noted in the right colon and transverse colon.  The uterus is atrophic.  In axial image 84 there  is nonspecific thickening of the anal wall. Measures at least 1.3 cm in thickness.  High density material probable blood products noted in the region of the anal lumen/anal sphincter. Measures about 1.8 cm.  Correlation with clinical exam is recommended to exclude bleeding hemorrhoids, bleeding inflammation /anal fissure or neoplastic process.  Delayed renal images shows bilateral renal excretion.  The bilateral visualized proximal ureter is unremarkable.  IMPRESSION:  1. In axial image 84 there is  nonspecific thickening of the anal wall.  Measures at least 1.3 cm in thickness.  High density material probable blood products noted in the region of the anal lumen/anal sphincter. Measures about 1.8 cm.  Correlation with clinical exam is recommended to exclude bleeding hemorrhoids, bleeding inflammation /anal fissure or neoplastic process. 2.  Colonic diverticulosis without evidence of acute diverticulitis. 3.  No small bowel obstruction.  No ascites or free air. 4.  Bilateral probable renal cysts.  No hydronephrosis or hydroureter. 5.  No pericecal inflammation.  Normal appendix is clearly visualized. 6.  Extensive atherosclerotic vascular calcifications. 7. Degenerative changes thoracolumbar spine.  Original Report Authenticated By: Natasha Mead, M.D.    Echo  - Left ventricle: The cavity size was normal. Wall thickness was increased in a pattern of moderate LVH. The estimated ejection fraction was 60%. Wall motion was normal; there were no regional wall motion abnormalities. Doppler parameters are consistent with abnormal left ventricular relaxation (grade 1 diastolic dysfunction).   - Aortic valve: Mildly calcified leaflets. There was moderate stenosis. Trivial regurgitation. Mean gradient: 26mm Hg (S). Peak gradient: 40mm Hg (S).   - Mitral valve: Calcified annulus.   - Right atrium: The atrium was mildly dilated.    Today   Subjective:   Elyn Krogh today has no headache,no chest abdominal pain,no new weakness tingling or numbness, feels much better wants to go home today.    Objective:   Blood pressure 154/83, pulse 81, temperature 99 F (37.2 C), temperature source Oral, resp. rate 20, height 5\' 5"  (1.651 m), weight 76.1 kg (167 lb 12.3 oz), SpO2 98.00%.  Intake/Output Summary (Last 24 hours) at 03/15/11 1217 Last data filed at 03/15/11 0900  Gross per 24 hour  Intake    720 ml  Output      0 ml  Net    720 ml    Exam Awake Alert, Oriented *3, No new F.N deficits, Normal  affect [place].AT,PERRAL Supple Neck,No JVD, No cervical lymphadenopathy appriciated.  Symmetrical Chest wall movement, Good air movement bilaterally, CTAB RRR,No Gallops,Rubs or new Murmurs, No Parasternal Heave +ve B.Sounds, Abd Soft, Non tender, No organomegaly appriciated, No rebound -guarding or rigidity. No Cyanosis, Clubbing or edema, No new Rash or bruise  Data Review      CBC w Diff: Lab Results  Component Value Date   WBC 9.0 03/13/2011   HGB 10.1* 03/13/2011   HCT 30.9* 03/13/2011   PLT 362 03/13/2011   LYMPHOPCT 30 04/02/2010   MONOPCT 11 04/02/2010   EOSPCT 2 04/02/2010   BASOPCT 0 04/02/2010   CMP: Lab Results  Component Value Date   NA 145 03/14/2011   K 3.8 03/15/2011   CL 108 03/14/2011   CO2 31 03/14/2011   BUN 7 03/14/2011   CREATININE 0.87 03/14/2011   PROT 5.4* 03/05/2011   ALBUMIN 2.5* 03/05/2011   BILITOT 0.3 03/05/2011   ALKPHOS 61 03/05/2011   AST 11 03/05/2011   ALT 8 03/05/2011  .  Micro Results Recent Results (from the past 240 hour(s))  MRSA PCR SCREENING     Status: Normal   Collection Time   03/06/11  6:24 PM      Component Value Range Status Comment   MRSA by PCR NEGATIVE  NEGATIVE  Final      Discharge Instructions     Follow with Primary MD  in 3 days   Get CBC, CMP, checked 3 days by Primary MD and again as instructed by your Primary MD.  Get Medicines reviewed and adjusted.  Please request your Prim.MD to go over all Hospital Tests and Procedure/Radiological results at the follow up, please get all Hospital records sent to your Prim MD by signing hospital release before you go home.  Activity: Fall precautions use walker/cane & assistance as needed  Diet: Cardiac,  Fluid restriction 1.8 lit/day, Aspiration precautions.  Check your Weight same time everyday, if you gain over 2 pounds, or you develop in leg swelling, experience more shortness of breath or chest pain, call your Primary MD immediately. Follow Cardiac Low Salt Diet and 1.8 lit/day  fluid restriction.  Disposition Home  If you experience worsening of your admission symptoms, develop shortness of breath, life threatening emergency, suicidal or homicidal thoughts you must seek medical attention immediately by calling 911 or calling your MD immediately  if symptoms less severe.  You Must read complete instructions/literature along with all the possible adverse reactions/side effects for all the Medicines you take and that have been prescribed to you. Take any new Medicines after you have completely understood and accpet all the possible adverse reactions/side effects.   Do not drive if your were admitted for syncope or siezures until you have seen by Primary MD or a Neurologist and advised to drive.  Do not drive when taking Pain medications.    Do not take more than prescribed Pain, Sleep and Anxiety Medications  Special Instructions: If you have smoked or chewed Tobacco  in the last 2 yrs please stop smoking, stop any regular Alcohol  and or any Recreational drug use.  Wear Seat belts while driving.  Follow-up Information    Follow up with PYRTLE, Carie Caddy, MD. Make an appointment in 1 week.   Contact information:   [address] [place] [address] [phone]       Follow up with Larina Earthly, MD in 2 weeks.   Contact information:   [address] [place] [address] [phone]       Follow up with Arvilla Meres, MD. Make an appointment in 1 week.   Contact information:   [address] [place] [address] [phone]          Discharge Medications   Medication List  As of 03/15/2011 12:17 PM   START taking these medications         albuterol (5 MG/ML) 0.5% nebulizer solution   Commonly known as: PROVENTIL   Take 0.5 mLs (2.5 mg total) by nebulization every 4 (four) hours as needed for wheezing.      ferrous sulfate 325 (65 FE) MG tablet   Take 1 tablet (325 mg total) by mouth daily with  breakfast.      folic acid 1 MG tablet   Commonly known as: FOLVITE   Take 1 tablet (1 mg total) by mouth daily.      furosemide 20 MG tablet   Commonly known as: LASIX   Take 1 tablet (20 mg total) by mouth 2 (two) times daily.  metoprolol tartrate 25 MG tablet   Commonly known as: LOPRESSOR   Take 1 tablet (25 mg total) by mouth 2 (two) times daily.         STOP taking these medications         aspirin 81 MG chewable tablet          Where to get your medications    These are the prescriptions that you need to pick up. We sent them to a specific pharmacy, so you will need to go there to get them.   [id number] [place],  - [address]    [address] [place] [address]    Phone: [phone]        albuterol (5 MG/ML) 0.5% nebulizer solution   ferrous sulfate 325 (65 FE) MG tablet   furosemide 20 MG tablet         You may get these medications from any pharmacy.         folic acid 1 MG tablet   metoprolol tartrate 25 MG tablet             Total Time in preparing paper work, data evaluation and todays exam - 35 minutes  Leroy Sea M.D on 03/15/2011 at 12:17 PM  [facility] Office  [phone]

## 2011-03-29 ENCOUNTER — Encounter: Payer: Self-pay | Admitting: Vascular Surgery

## 2011-03-30 ENCOUNTER — Ambulatory Visit (INDEPENDENT_AMBULATORY_CARE_PROVIDER_SITE_OTHER): Payer: Medicare Other | Admitting: Vascular Surgery

## 2011-03-30 ENCOUNTER — Encounter: Payer: Self-pay | Admitting: Vascular Surgery

## 2011-03-30 VITALS — BP 118/77 | HR 82 | Resp 16 | Ht 65.0 in | Wt 167.0 lb

## 2011-03-30 DIAGNOSIS — I7090 Unspecified atherosclerosis: Secondary | ICD-10-CM

## 2011-03-30 HISTORY — DX: Unspecified atherosclerosis: I70.90

## 2011-03-30 NOTE — Progress Notes (Signed)
The patient presents today for followup of the hospital consult from 03/12/2011. She was admitted to the hospital with a GI bleed. During hospitalization she had an episode of shortness of breath and had a CT of her chest to rule out pulmonary embolus. This showed no evidence of embolus but did show an incidental finding of plaque in her transverse and descending thoracic aorta was counseled for further discussion of this. Patient was eventually discharged to a nursing facility for rehabilitation and hopefully eventually home. She is here today with her son. She reports generalized weakness and no further GI bleed.   Past Medical History  Diagnosis Date  . Asthma   . Shortness of breath   . Hypertension   . Blood transfusion     History  Substance Use Topics  . Smoking status: Former Smoker -- 1.0 packs/day    Types: Cigarettes  . Smokeless tobacco: Never Used  . Alcohol Use: No    History reviewed. No pertinent family history.  Allergies  Allergen Reactions  . Penicillins Other (See Comments)    unknown    Current outpatient prescriptions:acetaminophen (TYLENOL) 650 MG CR tablet, Take 650 mg by mouth every 8 (eight) hours as needed., Disp: , Rfl: ;  albuterol (PROVENTIL) (5 MG/ML) 0.5% nebulizer solution, Take 0.5 mLs (2.5 mg total) by nebulization every 4 (four) hours as needed for wheezing., Disp: 20 mL, Rfl: 1;  donepezil (ARICEPT) 10 MG tablet, Take 10 mg by mouth at bedtime as needed., Disp: , Rfl:  ferrous sulfate 325 (65 FE) MG tablet, Take 1 tablet (325 mg total) by mouth daily with breakfast., Disp: 30 tablet, Rfl: 11;  fluticasone-salmeterol (ADVAIR HFA) 115-21 MCG/ACT inhaler, Inhale 2 puffs into the lungs 2 (two) times daily., Disp: , Rfl: ;  folic acid (FOLVITE) 1 MG tablet, Take 1 tablet (1 mg total) by mouth daily., Disp: 30 tablet, Rfl: 0 furosemide (LASIX) 20 MG tablet, Take 1 tablet (20 mg total) by mouth 2 (two) times daily., Disp: 10 tablet, Rfl: 0;  magnesium  hydroxide (MILK OF MAGNESIA) 400 MG/5ML suspension, Take by mouth daily as needed., Disp: , Rfl: ;  metoprolol tartrate (LOPRESSOR) 25 MG tablet, Take 1 tablet (25 mg total) by mouth 2 (two) times daily., Disp: 30 tablet, Rfl: 0;  rosuvastatin (CRESTOR) 5 MG tablet, Take 5 mg by mouth daily., Disp: , Rfl:   BP 118/77  Pulse 82  Resp 16  Ht 5\' 5"  (1.651 m)  Wt 167 lb (75.751 kg)  BMI 27.79 kg/m2  SpO2 94%  Body mass index is 27.79 kg/(m^2).       Physical exam well-developed well-nourished black female no acute distress 2+ radial pulses bilaterally neurologically grossly intact. Her graft I again reviewed her CT scan with the patient and her son present. Shows no evidence of aneurysm or dissection in her thoracic aorta.  Impression and plan: Incidental finding of thoracic aortic plaque. No evidence of dissection or aneurysm. No followup needed.

## 2011-04-18 ENCOUNTER — Other Ambulatory Visit: Payer: Self-pay

## 2011-04-18 ENCOUNTER — Emergency Department (HOSPITAL_COMMUNITY): Payer: Medicare Other

## 2011-04-18 ENCOUNTER — Observation Stay (HOSPITAL_COMMUNITY)
Admission: EM | Admit: 2011-04-18 | Discharge: 2011-04-22 | Disposition: A | Discharge status: Skilled Nursing Facility | Payer: Medicare Other | Source: Ambulatory Visit | Attending: Internal Medicine | Admitting: Internal Medicine

## 2011-04-18 ENCOUNTER — Encounter (HOSPITAL_COMMUNITY): Payer: Self-pay | Admitting: *Deleted

## 2011-04-18 DIAGNOSIS — M6281 Muscle weakness (generalized): Secondary | ICD-10-CM | POA: Insufficient documentation

## 2011-04-18 DIAGNOSIS — F03918 Unspecified dementia, unspecified severity, with other behavioral disturbance: Principal | ICD-10-CM | POA: Insufficient documentation

## 2011-04-18 DIAGNOSIS — K219 Gastro-esophageal reflux disease without esophagitis: Secondary | ICD-10-CM | POA: Insufficient documentation

## 2011-04-18 DIAGNOSIS — I519 Heart disease, unspecified: Secondary | ICD-10-CM | POA: Insufficient documentation

## 2011-04-18 DIAGNOSIS — N179 Acute kidney failure, unspecified: Secondary | ICD-10-CM | POA: Diagnosis present

## 2011-04-18 DIAGNOSIS — R531 Weakness: Secondary | ICD-10-CM

## 2011-04-18 DIAGNOSIS — F0391 Unspecified dementia with behavioral disturbance: Secondary | ICD-10-CM | POA: Insufficient documentation

## 2011-04-18 DIAGNOSIS — E876 Hypokalemia: Secondary | ICD-10-CM | POA: Insufficient documentation

## 2011-04-18 DIAGNOSIS — M47817 Spondylosis without myelopathy or radiculopathy, lumbosacral region: Secondary | ICD-10-CM | POA: Insufficient documentation

## 2011-04-18 DIAGNOSIS — R8281 Pyuria: Secondary | ICD-10-CM | POA: Diagnosis present

## 2011-04-18 DIAGNOSIS — J45909 Unspecified asthma, uncomplicated: Secondary | ICD-10-CM | POA: Insufficient documentation

## 2011-04-18 DIAGNOSIS — F1010 Alcohol abuse, uncomplicated: Secondary | ICD-10-CM | POA: Insufficient documentation

## 2011-04-18 DIAGNOSIS — R4182 Altered mental status, unspecified: Secondary | ICD-10-CM | POA: Insufficient documentation

## 2011-04-18 DIAGNOSIS — F29 Unspecified psychosis not due to a substance or known physiological condition: Secondary | ICD-10-CM | POA: Diagnosis present

## 2011-04-18 DIAGNOSIS — I10 Essential (primary) hypertension: Secondary | ICD-10-CM | POA: Insufficient documentation

## 2011-04-18 DIAGNOSIS — R0602 Shortness of breath: Secondary | ICD-10-CM | POA: Insufficient documentation

## 2011-04-18 HISTORY — DX: Weakness: R53.1

## 2011-04-18 LAB — URINALYSIS, ROUTINE W REFLEX MICROSCOPIC
Bilirubin Urine: NEGATIVE
Glucose, UA: NEGATIVE mg/dL
Hgb urine dipstick: NEGATIVE
Ketones, ur: NEGATIVE mg/dL
Protein, ur: NEGATIVE mg/dL
Urobilinogen, UA: 0.2 mg/dL (ref 0.0–1.0)

## 2011-04-18 LAB — COMPREHENSIVE METABOLIC PANEL
ALT: <5 U/L (ref 0–35)
AST: 10 U/L (ref 0–37)
Alkaline Phosphatase: 88 U/L (ref 39–117)
CO2: 28 mEq/L (ref 19–32)
Calcium: 10.3 mg/dL (ref 8.4–10.5)
Chloride: 102 mEq/L (ref 96–112)
GFR calc Af Amer: 58 mL/min — ABNORMAL LOW (ref >90)
GFR calc non Af Amer: 50 mL/min — ABNORMAL LOW (ref >90)
Glucose, Bld: 98 mg/dL (ref 70–99)
Sodium: 143 mEq/L (ref 135–145)
Total Bilirubin: 0.8 mg/dL (ref 0.3–1.2)

## 2011-04-18 LAB — URINE MICROSCOPIC-ADD ON

## 2011-04-18 LAB — CBC
HCT: 39.4 % (ref 36.0–46.0)
MCV: 88.3 fL (ref 78.0–100.0)
Platelets: 304 10*3/uL (ref 150–400)
Platelets: 315 10*3/uL (ref 150–400)
RBC: 4.46 MIL/uL (ref 3.87–5.11)
RBC: 4.35 MIL/uL (ref 3.87–5.11)
WBC: 10.1 10*3/uL (ref 4.0–10.5)
WBC: 9.1 10*3/uL (ref 4.0–10.5)

## 2011-04-18 LAB — CREATININE, SERUM
Creatinine, Ser: 1.04 mg/dL (ref 0.50–1.10)
GFR calc Af Amer: 58 mL/min — ABNORMAL LOW (ref >90)
GFR calc non Af Amer: 50 mL/min — ABNORMAL LOW (ref >90)

## 2011-04-18 LAB — DIFFERENTIAL
Eosinophils Relative: 1 % (ref 0–5)
Lymphocytes Relative: 19 % (ref 12–46)
Lymphs Abs: 1.9 10*3/uL (ref 0.7–4.0)
Neutro Abs: 7.3 10*3/uL (ref 1.7–7.7)

## 2011-04-18 LAB — MRSA PCR SCREENING: MRSA by PCR: NEGATIVE

## 2011-04-18 MED ORDER — SODIUM CHLORIDE 0.9 % IJ SOLN: 3.0000 mL | INTRAMUSCULAR | Status: DC | PRN

## 2011-04-18 MED ORDER — METOPROLOL TARTRATE 25 MG PO TABS
25.0000 mg | ORAL_TABLET | Freq: Two times a day (BID) | ORAL | Status: DC
  Administered 2011-04-18 – 2011-04-22 (×7): 25 mg via ORAL
  Filled 2011-04-18 (×10): qty 1

## 2011-04-18 MED ORDER — ALBUTEROL SULFATE (5 MG/ML) 0.5% IN NEBU: 2.5000 mg | INHALATION_SOLUTION | RESPIRATORY_TRACT | Status: DC | PRN

## 2011-04-18 MED ORDER — SODIUM CHLORIDE 0.9 % IV SOLN: 250.0000 mL | INTRAVENOUS | Status: DC | PRN

## 2011-04-18 MED ORDER — DONEPEZIL HCL 10 MG PO TABS
10.0000 mg | ORAL_TABLET | Freq: Every day | ORAL | Status: DC
  Administered 2011-04-18 – 2011-04-21 (×4): 10 mg via ORAL
  Filled 2011-04-18 (×5): qty 1

## 2011-04-18 MED ORDER — ENOXAPARIN SODIUM 30 MG/0.3ML ~~LOC~~ SOLN
30.0000 mg | SUBCUTANEOUS | Status: DC
  Administered 2011-04-18 – 2011-04-19 (×2): 30 mg via SUBCUTANEOUS
  Filled 2011-04-18 (×3): qty 0.3

## 2011-04-18 MED ORDER — AMLODIPINE BESYLATE 5 MG PO TABS
5.0000 mg | ORAL_TABLET | Freq: Every day | ORAL | Status: DC
  Administered 2011-04-19 – 2011-04-22 (×4): 5 mg via ORAL
  Filled 2011-04-18 (×5): qty 1

## 2011-04-18 MED ORDER — ROSUVASTATIN CALCIUM 5 MG PO TABS
5.0000 mg | ORAL_TABLET | Freq: Every day | ORAL | Status: DC
  Administered 2011-04-19 – 2011-04-22 (×4): 5 mg via ORAL
  Filled 2011-04-18 (×5): qty 1

## 2011-04-18 MED ORDER — ONDANSETRON HCL 4 MG PO TABS: 4.0000 mg | ORAL_TABLET | Freq: Four times a day (QID) | ORAL | Status: DC | PRN

## 2011-04-18 MED ORDER — SODIUM CHLORIDE 0.9 % IJ SOLN
3.0000 mL | Freq: Two times a day (BID) | INTRAMUSCULAR | Status: DC
  Administered 2011-04-18 – 2011-04-22 (×9): 3 mL via INTRAVENOUS

## 2011-04-18 MED ORDER — FUROSEMIDE 20 MG PO TABS
20.0000 mg | ORAL_TABLET | Freq: Two times a day (BID) | ORAL | Status: DC
  Administered 2011-04-18 – 2011-04-22 (×8): 20 mg via ORAL
  Filled 2011-04-18 (×10): qty 1

## 2011-04-18 MED ORDER — LEVOTHYROXINE SODIUM 125 MCG PO TABS
125.0000 ug | ORAL_TABLET | Freq: Every day | ORAL | Status: DC
Start: 2011-04-18 — End: 2011-04-22
  Administered 2011-04-19 – 2011-04-22 (×4): 125 ug via ORAL
  Filled 2011-04-18 (×5): qty 1

## 2011-04-18 MED ORDER — FERROUS SULFATE 325 (65 FE) MG PO TABS
325.0000 mg | ORAL_TABLET | Freq: Every day | ORAL | Status: DC
  Administered 2011-04-19 – 2011-04-22 (×4): 325 mg via ORAL
  Filled 2011-04-18 (×6): qty 1

## 2011-04-18 MED ORDER — MAGNESIUM OXIDE 400 MG PO TABS
400.0000 mg | ORAL_TABLET | Freq: Every day | ORAL | Status: DC
  Administered 2011-04-19 – 2011-04-22 (×4): 400 mg via ORAL
  Filled 2011-04-18 (×5): qty 1

## 2011-04-18 MED ORDER — ACETAMINOPHEN 325 MG PO TABS
650.0000 mg | ORAL_TABLET | Freq: Three times a day (TID) | ORAL | Status: DC | PRN
  Administered 2011-04-19 – 2011-04-21 (×3): 650 mg via ORAL
  Filled 2011-04-18 (×3): qty 2

## 2011-04-18 MED ORDER — ONDANSETRON HCL 4 MG/2ML IJ SOLN: 4.0000 mg | Freq: Four times a day (QID) | INTRAMUSCULAR | Status: DC | PRN

## 2011-04-18 MED ORDER — MANAGING BACK PAIN BOOK
Freq: Once | Status: AC
  Administered 2011-04-18: 12:00:00
  Filled 2011-04-18 (×2): qty 1

## 2011-04-18 MED ORDER — LORAZEPAM 0.5 MG PO TABS
0.2500 mg | ORAL_TABLET | Freq: Every day | ORAL | Status: DC
  Administered 2011-04-19 – 2011-04-21 (×3): 0.25 mg via ORAL
  Filled 2011-04-18: qty 1
  Filled 2011-04-18: qty 2
  Filled 2011-04-18 (×2): qty 1

## 2011-04-18 MED ORDER — FLUTICASONE-SALMETEROL 250-50 MCG/DOSE IN AEPB
1.0000 | INHALATION_SPRAY | Freq: Two times a day (BID) | RESPIRATORY_TRACT | Status: DC
  Administered 2011-04-18 – 2011-04-22 (×6): 1 via RESPIRATORY_TRACT
  Filled 2011-04-18 (×2): qty 14

## 2011-04-18 MED ORDER — TEMAZEPAM 7.5 MG PO CAPS
30.0000 mg | ORAL_CAPSULE | Freq: Every evening | ORAL | Status: DC | PRN
  Administered 2011-04-19 – 2011-04-20 (×2): 30 mg via ORAL
  Filled 2011-04-18 (×2): qty 4

## 2011-04-18 NOTE — ED Provider Notes (Signed)
History     CSN: [id number]  Arrival date & time 04/18/11  1041   First MD Initiated Contact with Patient 04/18/11 1101      Chief Complaint  Patient presents with  . Aggressive Behavior   level V caveat to dementia.  (Consider location/radiation/quality/duration/timing/severity/associated sxs/prior treatment) The history is provided by the patient and a relative.   patient was brought in from the nursing home with episodes of aggression and combativeness. Patient states she's been feeling bad the last few days. She states she drinks water all the time, but is not hungry. She states she urinates a lot, but that is because she's drinking water. No chest pain. No abdominal pain. No diarrhea. No Cough. Her daughter states the patient has a history of a aggressiveness. Past Medical History  Diagnosis Date  . Asthma   . Shortness of breath   . Hypertension   . Blood transfusion     Past Surgical History  Procedure Date  . Colonoscopy 03/08/2011    Procedure: COLONOSCOPY;  Surgeon: Erick Blinks, MD;  Location: [facility] ENDOSCOPY;  Service: Gastroenterology;  Laterality: N/A;    History reviewed. No pertinent family history.  History  Substance Use Topics  . Smoking status: Former Smoker -- 1.0 packs/day    Types: Cigarettes  . Smokeless tobacco: Never Used  . Alcohol Use: Yes    OB History    Grav Para Term Preterm Abortions TAB SAB Ect Mult Living                  Review of Systems  Unable to perform ROS: Dementia    Allergies  Penicillins  Home Medications   Current Outpatient Rx  Name Route Sig Dispense Refill  . ACETAMINOPHEN ER 650 MG PO TBCR Oral Take 650 mg by mouth every 8 (eight) hours as needed. For pain    . ALBUTEROL SULFATE (5 MG/ML) 0.5% IN NEBU Nebulization Take 2.5 mg by nebulization every 4 (four) hours as needed. wheezing    . AMLODIPINE BESYLATE 5 MG PO TABS Oral Take 5 mg by mouth daily.    . DONEPEZIL HCL 10 MG PO TABS Oral Take 10 mg by mouth at  bedtime.     Marland Kitchen FERROUS SULFATE 325 (65 FE) MG PO TABS Oral Take 325 mg by mouth daily with breakfast.    . FLUTICASONE-SALMETEROL 250-50 MCG/DOSE IN AEPB Inhalation Inhale 1 puff into the lungs every 12 (twelve) hours.    . FUROSEMIDE 20 MG PO TABS Oral Take 20 mg by mouth 2 (two) times daily.    Marland Kitchen LEVOTHYROXINE SODIUM 125 MCG PO TABS Oral Take 125 mcg by mouth daily.    Marland Kitchen LORAZEPAM 0.5 MG PO TABS Oral Take 0.25 mg by mouth daily.    Marland Kitchen MAGNESIUM OXIDE 400 MG PO TABS Oral Take 400 mg by mouth daily.    Marland Kitchen METOPROLOL TARTRATE 25 MG PO TABS Oral Take 25 mg by mouth 2 (two) times daily.    Marland Kitchen ROSUVASTATIN CALCIUM 5 MG PO TABS Oral Take 5 mg by mouth daily.    Marland Kitchen TEMAZEPAM 30 MG PO CAPS Oral Take 30 mg by mouth at bedtime as needed. Sleep      BP 100/55  Pulse 110  Temp(Src) 98.7 F (37.1 C) (Oral)  Resp 16  SpO2 97%  Physical Exam  Nursing note and vitals reviewed. Constitutional: She appears well-developed and well-nourished.  HENT:  Head: Normocephalic and atraumatic.  Eyes: EOM are normal. Pupils are equal, round,  and reactive to light.  Neck: Normal range of motion. Neck supple.  Cardiovascular: Normal rate, regular rhythm and normal heart sounds.   No murmur heard. Pulmonary/Chest: Effort normal and breath sounds normal. No respiratory distress. She has no wheezes. She has no rales.  Abdominal: Soft. Bowel sounds are normal. She exhibits no distension. There is no tenderness. There is no rebound and no guarding.  Musculoskeletal: Normal range of motion.       Upper lumbar tenderness without step-off or deformity  Neurological: She is alert. No cranial nerve deficit.       Mild baseline dementia  Skin: Skin is warm and dry.  Psychiatric: She has a normal mood and affect. Her speech is normal.    ED Course  Procedures (including critical care time)  Labs Reviewed  COMPREHENSIVE METABOLIC PANEL - Abnormal; Notable for the following:    Potassium 3.0 (*)    Albumin 3.4 (*)     GFR calc non Af Amer 50 (*)    GFR calc Af Amer 58 (*)    All other components within normal limits  URINALYSIS, ROUTINE W REFLEX MICROSCOPIC - Abnormal; Notable for the following:    APPearance HAZY (*)    Leukocytes, UA SMALL (*)    All other components within normal limits  URINE MICROSCOPIC-ADD ON - Abnormal; Notable for the following:    Squamous Epithelial / LPF MANY (*)    Casts GRANULAR CAST (*) HYALINE CASTS   All other components within normal limits  CBC  DIFFERENTIAL  ETHANOL  URINE CULTURE   Dg Chest 2 View  04/18/2011  *RADIOLOGY REPORT*  Clinical Data: Chest pain, back pain  CHEST - 2 VIEW  Comparison: 03/12/2011  Findings: Cardiomediastinal silhouette is stable.  No acute infiltrate or pleural effusion.  No pulmonary edema.  Stable degenerative changes thoracic spine.  Stable degenerative changes bilateral shoulders.  IMPRESSION: No active disease.  No significant change.  Original Report Authenticated By: Natasha Mead, M.D.   Dg Lumbar Spine Complete  04/18/2011  *RADIOLOGY REPORT*  Clinical Data: Back pain.  LUMBAR SPINE - COMPLETE 4+ VIEW  Comparison: 11/19/2007.  Findings: Stable degenerative lumbar spondylosis with disc disease and facet disease.  No acute fracture or pars defect.  Stable vascular calcifications.  The visualized bony pelvis is grossly intact.  IMPRESSION:  1.  Stable degenerative lumbar spondylosis with disc disease and facet disease. 2.  No acute bony findings.  Original Report Authenticated By: P. Loralie Champagne, M.D.     1. Altered mental status     Date: 04/18/2011  Rate: 68  Rhythm: normal sinus rhythm  QRS Axis: normal  Intervals: normal  ST/T Wave abnormalities: normal and nonspecific ST/T changes  Conduction Disutrbances:none  Narrative Interpretation:   Old EKG Reviewed: changes noted     MDM  Patient became combative aggressive couple episodes at the nursing home. She has white cells in the urine but no bacteria. Lab work is  otherwise reassuring. Discussed with the staff the nursing home, they state she cannot come back, she has 24-hour monitoring by the family. Family states she cannot go home with them. She was placed in the hospital for further placement and evaluation the altered mental status        Juliet Rude. Rubin Payor, MD 04/18/11 1410

## 2011-04-18 NOTE — Plan of Care (Signed)
Problem: Consults Goal: General Medical Patient Education See Patient Education Module for specific education. Outcome: Completed/Met Date Met:  04/18/11 Pt oriented to unit, room and routine, safety video viewed  Problem: Phase I Progression Outcomes Goal: Initial discharge plan identified Outcome: Completed/Met Date Met:  04/18/11 Pt will need SNF placement

## 2011-04-18 NOTE — H&P (Signed)
PCP:  Gwynneth Aliment, MD, MD   DOA:  04/18/2011 10:49 AM  Chief Complaint:  Chronic generalized weakness  HPI: 78 yo female with history of hypertension, asthma, GERD, presents to [facility] emergency department with main concern of progressively worsening generalized weakness along with changes in mental status noted per daughter. According to patient she reports having chronic generalized weakness and difficulty with ambulating, she uses a walker to get around. She has been a resident of [facility] and per daughter attending nurses have reported changes in mental status with hallucinations, aggressive behavior, they initially started 2 weeks prior to admission and have been getting progressively worse. Patient denies any concerns at that time, no chest pain or shortness of breath, no fevers or chills, no abdominal or urinary concerns, no systemic symptoms of weight loss, changes in weight, no focal weakness. Patient's daughter who is present at bedside reports that her mother is an alcoholic and she's not sure, how much alcohol she drinks daily, she also reports that mother appears to be more agitated over the past several weeks with aggressive behavior to overt her and others along with hallucinations. Daughter would like to have a psychiatrist involved for further evaluation and to determine if patient is able to make decisions on her own.  Allergies: Allergies  Allergen Reactions  . Penicillins Other (See Comments)    unknown    Prior to Admission medications   Medication Sig Start Date End Date Taking? Authorizing Provider  acetaminophen (TYLENOL) 650 MG CR tablet Take 650 mg by mouth every 8 (eight) hours as needed. For pain   Yes Historical Provider, MD  albuterol (PROVENTIL) (5 MG/ML) 0.5% nebulizer solution Take 2.5 mg by nebulization every 4 (four) hours as needed. wheezing 03/15/11 03/14/12 Yes Leroy Sea, MD  amLODipine (NORVASC) 5 MG tablet Take 5 mg by mouth daily.    Yes Historical Provider, MD  donepezil (ARICEPT) 10 MG tablet Take 10 mg by mouth at bedtime.    Yes Historical Provider, MD  ferrous sulfate 325 (65 FE) MG tablet Take 325 mg by mouth daily with breakfast. 03/15/11 03/14/12 Yes Leroy Sea, MD  Fluticasone-Salmeterol (ADVAIR) 250-50 MCG/DOSE AEPB Inhale 1 puff into the lungs every 12 (twelve) hours.   Yes Historical Provider, MD  furosemide (LASIX) 20 MG tablet Take 20 mg by mouth 2 (two) times daily. 03/15/11 03/14/12 Yes Leroy Sea, MD  levothyroxine (SYNTHROID, LEVOTHROID) 125 MCG tablet Take 125 mcg by mouth daily.   Yes Historical Provider, MD  LORazepam (ATIVAN) 0.5 MG tablet Take 0.25 mg by mouth daily.   Yes Historical Provider, MD  magnesium oxide (MAG-OX) 400 MG tablet Take 400 mg by mouth daily.   Yes Historical Provider, MD  metoprolol tartrate (LOPRESSOR) 25 MG tablet Take 25 mg by mouth 2 (two) times daily. 03/15/11 03/14/12 Yes Leroy Sea, MD  rosuvastatin (CRESTOR) 5 MG tablet Take 5 mg by mouth daily.   Yes Historical Provider, MD  temazepam (RESTORIL) 30 MG capsule Take 30 mg by mouth at bedtime as needed. Sleep   Yes Historical Provider, MD    Past Medical History  Diagnosis Date  . Asthma   . Shortness of breath   . Hypertension   . Blood transfusion     Past Surgical History  Procedure Date  . Colonoscopy 03/08/2011    Procedure: COLONOSCOPY;  Surgeon: Erick Blinks, MD;  Location: [facility] ENDOSCOPY;  Service: Gastroenterology;  Laterality: N/A;    Social History:  reports  that she has quit smoking. Her smoking use included Cigarettes. She smoked 1 pack per day. She has never used smokeless tobacco. She reports that she drinks alcohol. She reports that she does not use illicit drugs.  History reviewed. No pertinent family history.  Review of Systems:  Constitutional: Denies fever, chills, diaphoresis, appetite change and fatigue.  HEENT: Denies photophobia, eye pain, redness, hearing loss, ear pain, congestion,  sore throat, rhinorrhea, sneezing, mouth sores, trouble swallowing, neck pain, neck stiffness and tinnitus.   Respiratory: Denies SOB, DOE, cough, chest tightness,  and wheezing.   Cardiovascular: Denies chest pain, palpitations and leg swelling.  Gastrointestinal: Denies nausea, vomiting, abdominal pain, diarrhea, constipation, blood in stool and abdominal distention.  Genitourinary: Denies dysuria, urgency, frequency, hematuria, flank pain and difficulty urinating.  Musculoskeletal: Denies myalgias, back pain, joint swelling, arthralgias and gait problem.  Skin: Denies pallor, rash and wound.  Neurological: Denies dizziness, seizures, syncope, light-headedness, numbness and headaches.  Hematological: Denies adenopathy. Easy bruising, personal or family bleeding history  Psychiatric/Behavioral: Denies suicidal ideation, mood changes, confusion, nervousness, sleep disturbance and agitation   Physical Exam:  Filed Vitals:   04/18/11 1101  BP: 100/55  Pulse: 110  Temp: 98.7 F (37.1 C)  TempSrc: Oral  Resp: 16  SpO2: 97%    Constitutional: Vital signs reviewed.  Patient is a well-developed and well-nourished in no acute distress and cooperative with exam. Alert and oriented x3.  Head: Normocephalic and atraumatic Ear: TM normal bilaterally Mouth: no erythema or exudates, MMM Eyes: PERRL, EOMI, conjunctivae normal, No scleral icterus.  Neck: Supple, Trachea midline normal ROM, No JVD, mass, thyromegaly, or carotid bruit present.  Cardiovascular: Irregular rate and rhythm, S1 normal, S2 normal, no MRG, pulses symmetric and intact bilaterally Pulmonary/Chest: CTAB, no wheezes, rales, or rhonchi Abdominal: Soft. Non-tender, non-distended, bowel sounds are normal, no masses, organomegaly, or guarding present.  GU: no CVA tenderness Musculoskeletal: No joint deformities, erythema, or stiffness, ROM full and no nontender Ext: no edema and no cyanosis, pulses palpable bilaterally (DP and  PT) Hematology: no cervical, inginal, or axillary adenopathy.  Neurological: A&O to name and place, Strenght is normal and symmetric bilaterally, cranial nerve II-XII are grossly intact, no focal motor deficit, sensory intact to light touch bilaterally.  Skin: Warm, dry and intact. No rash, cyanosis, or clubbing.  Psychiatric: Normal mood and affect. speech and behavior is normal. Judgment and thought content normal. Cognition and memory are normal.   Labs on Admission:  Results for orders placed during the hospital encounter of 04/18/11 (from the past 48 hour(s))  CBC     Status: Normal   Collection Time   04/18/11 11:33 AM      Component Value Range Comment   WBC 10.1  4.0 - 10.5 (K/uL)    RBC 4.46  3.87 - 5.11 (MIL/uL)    Hemoglobin 12.7  12.0 - 15.0 (g/dL)    HCT 16.1  09.6 - 04.5 (%)    MCV 88.3  78.0 - 100.0 (fL)    MCH 28.5  26.0 - 34.0 (pg)    MCHC 32.2  30.0 - 36.0 (g/dL)    RDW 40.9  81.1 - 91.4 (%)    Platelets 304  150 - 400 (K/uL)   DIFFERENTIAL     Status: Normal   Collection Time   04/18/11 11:33 AM      Component Value Range Comment   Neutrophils Relative 72  43 - 77 (%)    Neutro Abs 7.3  1.7 -  7.7 (K/uL)    Lymphocytes Relative 19  12 - 46 (%)    Lymphs Abs 1.9  0.7 - 4.0 (K/uL)    Monocytes Relative 8  3 - 12 (%)    Monocytes Absolute 0.8  0.1 - 1.0 (K/uL)    Eosinophils Relative 1  0 - 5 (%)    Eosinophils Absolute 0.1  0.0 - 0.7 (K/uL)    Basophils Relative 0  0 - 1 (%)    Basophils Absolute 0.0  0.0 - 0.1 (K/uL)   COMPREHENSIVE METABOLIC PANEL     Status: Abnormal   Collection Time   04/18/11 11:33 AM      Component Value Range Comment   Sodium 143  135 - 145 (mEq/L)    Potassium 3.0 (*) 3.5 - 5.1 (mEq/L)    Chloride 102  96 - 112 (mEq/L)    CO2 28  19 - 32 (mEq/L)    Glucose, Bld 98  70 - 99 (mg/dL)    BUN 19  6 - 23 (mg/dL)    Creatinine, Ser 4.09  0.50 - 1.10 (mg/dL)    Calcium 81.1  8.4 - 10.5 (mg/dL)    Total Protein 7.7  6.0 - 8.3 (g/dL)     Albumin 3.4 (*) 3.5 - 5.2 (g/dL)    AST 10  0 - 37 (U/L)    ALT <5  0 - 35 (U/L)    Alkaline Phosphatase 88  39 - 117 (U/L)    Total Bilirubin 0.8  0.3 - 1.2 (mg/dL)    GFR calc non Af Amer 50 (*) >90 (mL/min)    GFR calc Af Amer 58 (*) >90 (mL/min)   ETHANOL     Status: Normal   Collection Time   04/18/11 11:33 AM      Component Value Range Comment   Alcohol, Ethyl (B) <11  0 - 11 (mg/dL)   URINALYSIS, ROUTINE W REFLEX MICROSCOPIC     Status: Abnormal   Collection Time   04/18/11 12:07 PM      Component Value Range Comment   Color, Urine YELLOW  YELLOW     APPearance HAZY (*) CLEAR     Specific Gravity, Urine 1.015  1.005 - 1.030     pH 5.0  5.0 - 8.0     Glucose, UA NEGATIVE  NEGATIVE (mg/dL)    Hgb urine dipstick NEGATIVE  NEGATIVE     Bilirubin Urine NEGATIVE  NEGATIVE     Ketones, ur NEGATIVE  NEGATIVE (mg/dL)    Protein, ur NEGATIVE  NEGATIVE (mg/dL)    Urobilinogen, UA 0.2  0.0 - 1.0 (mg/dL)    Nitrite NEGATIVE  NEGATIVE     Leukocytes, UA SMALL (*) NEGATIVE    URINE MICROSCOPIC-ADD ON     Status: Abnormal   Collection Time   04/18/11 12:07 PM      Component Value Range Comment   Squamous Epithelial / LPF MANY (*) RARE     WBC, UA 11-20  <3 (WBC/hpf)    RBC / HPF 0-2  <3 (RBC/hpf)    Bacteria, UA RARE  RARE     Casts GRANULAR CAST (*) NEGATIVE  HYALINE CASTS    Radiological Exams on Admission: CXR - no acute cardiopulmonary events  Assessment/Plan  Principal Problem:  *Generalized weakness - This is chronic in nature and most likely secondary to progressive deconditioning - Currently I don't see any evidence of metabolic process that could contribute to chronic generalized weakness - Electrolytes are  within normal limits - CBC is also within normal limits - Since there are no focal neurologic findings on neurologic examination we will defer on CT of the head at this time - Will obtain physical therapy evaluation for further recommendations - If physical therapy  recommendation suggest placement to skilled nursing facility will proceed with placement  Active Problems:  GERD - stable   HTN (hypertension) - stable   Pyuria - Patient does not have symptoms of urinary tract infection, denies urgency, frequency, dysuria, hematuria - Will treat empirically for now and will discontinue antibiotics with urine cultures come back    Disposition - Current test results were discussed with patient and daughter who is present at bedside - Daughter requires psychiatry consult to determine if patient has psychosis, Alzheimer dementia, and would like to know if patient is competent to make her decision  Time Spent on Admission: Over 30 minutes  MAGICK-MYERS, ISKRA 04/18/2011, 2:18 PM  [phone]

## 2011-04-18 NOTE — Progress Notes (Signed)
Juliza Machnik Schertzer [id number] Arrival to unit: 04/18/2011 1530 Attending Provider: Debbora Presto, MD  WUJ:WJXBJYN,WGNFA N, MD, MD Consults/ Treatment Team:    AVEYA BEAL is a 78 y.o. female patient admitted from ED awake, alert  & orientated  X 3,  Full Code, VSS - Blood pressure 113/70, pulse 77, temperature 97.8 F (36.6 C), temperature source Oral, resp. rate 18, height 5\' 4"  (1.626 m), weight 76 kg (167 lb 8.8 oz), SpO2 97.00%. RA, no c/o shortness of breath, no c/o chest pain, no distress noted. IV site WDL: NSL hand right, condition patent and no redness with a transparent dsg that's clean dry and intact.  Allergies:   Allergies  Allergen Reactions  . Penicillins Other (See Comments)    unknown     Past Medical History  Diagnosis Date  . Asthma   . Shortness of breath   . Hypertension   . Blood transfusion     History:  obtained from daughter.  Pt orientation to unit, room and routine. Information packet given to patient/family and safety video watched.  Admission INP armband ID verified with patient/family, and in place. SR up x 2, fall risk assessment complete with Patient and family verbalizing understanding of risks associated with falls. Pt verbalizes an understanding of how to use the call bell and to call for help before getting out of bed.  Skin, clean-dry- intact without evidence of bruising, or skin tears, pt has hypopigmented areas around bil eyes and to neck. No evidence of skin break down noted on exam.     Will cont to monitor and assist as needed.  Julien Nordmann [facility], RN 04/18/2011 4:54 PM

## 2011-04-18 NOTE — Progress Notes (Signed)
Report received at 1512 and pt just arrived to unit. [name] [facility]

## 2011-04-18 NOTE — ED Notes (Signed)
Attempted to call report x 1  

## 2011-04-18 NOTE — ED Notes (Signed)
Pt stays at [facility], they sent her due to 2 episodes of being combative/aggressive to staff.

## 2011-04-19 DIAGNOSIS — F29 Unspecified psychosis not due to a substance or known physiological condition: Secondary | ICD-10-CM | POA: Diagnosis present

## 2011-04-19 LAB — CBC
Platelets: 299 10*3/uL (ref 150–400)
RBC: 4.24 MIL/uL (ref 3.87–5.11)
WBC: 8.5 10*3/uL (ref 4.0–10.5)

## 2011-04-19 LAB — BASIC METABOLIC PANEL
Calcium: 10.1 mg/dL (ref 8.4–10.5)
GFR calc non Af Amer: 55 mL/min — ABNORMAL LOW (ref >90)
Sodium: 144 mEq/L (ref 135–145)

## 2011-04-19 LAB — URINE CULTURE
Colony Count: NO GROWTH
Culture  Setup Time: 201302101830

## 2011-04-19 MED ORDER — POTASSIUM CHLORIDE CRYS ER 20 MEQ PO TBCR
40.0000 meq | EXTENDED_RELEASE_TABLET | Freq: Two times a day (BID) | ORAL | Status: AC
  Administered 2011-04-19 (×2): 40 meq via ORAL
  Filled 2011-04-19 (×2): qty 2

## 2011-04-19 NOTE — Progress Notes (Signed)
Clinical Social Work Psychiatry  Assessment    Presenting Symptoms/Problems: presents to [facility] emergency department with main concern of progressively worsening generalized weakness along with changes in mental status noted per daughter. According to patient she reports having chronic generalized weakness and difficulty with ambulating, she uses a walker to get around. She has been a resident of [facility] and per daughter attending nurses have reported changes in mental status with hallucinations, aggressive behavior, they initially started 2 weeks prior to admission and have been getting progressively worse.  Met with patient at bedside who reports she has been confused more recently, but does not recall hallucination or aggressive behavior.  Reports she keeps to herself and if provoked with become aggressive, but for the most part she does not bother anyone.  Reports she is depressed and has been sad for many years.  No SI present.   Psychiatric History: Declines any history or current psychiatric problems.  Reports she has a history of depression due to living situation.  Patient reports she is sad but does not endorse SI, intent, or thoughts at this time.  Denies any AVH and reports what she sees is what CSW sees.  Reports poor sleep in which she wakes up a lot but denies any nightmares.  Reports poor appetite as well.     Family Collateral Information: Patient is originally for [place], born and raised in [place] and her daughter Vernona Rieger came to college in [place] and when visiting her a one time, became sick and was unable to return to [place] and causes some depression.  Patient reports she has a son and daughter here in [place], two daughters in [place] and her husband has passed.  Reports she never worked, that husband was a good man and took care of her so she did not have to work.  Reports she lives an a apartment and keeps to herself.  Reports if provoked she will talk back, but denies any recent  problems.  Patient reports she drank [place] on rocks for many years, starting in [employer], but has not drank recently. Also reports smoking cigarettes in the past, but declines any current tobacco use at this time.  No illicit drugs ever.      Patient daughter reports patient does not tolerate people well, and tends to express her feelings verbally and very harsh. Reports patient has been calling multiple times and understanding why she was in the hospital.  Daughter reports she has been evaluated for Dementia and reports she has been diagnosed with Dementia. Reports patient was admitted to [facility] for alcohol abuse and psychosis with medication (early 39s) but no issues other than that one admission.  Patient never completed a substance abuse program per daughter.  Reports patient was in the hospital back in January, placed in [facility] for SNF and was dc on 04/05/10 to [facility].  Daughter is unsure if patient care return, but will be going to ALF today to discuss.  Reports patient has been depressed due to multiple placements and family dynamics.  Declines patient is SI, but has reported sometimes she feels she would be better off dead. NO plan or intent, just thoughts of hopelessness.                     Emotional Health/Current Symptoms:    Patient lying in bed expressing she is very cold and inquisitive of reason for visit.  Patient very calm and cooperative during assessment.  Patient  reports she has been confused, but was unable to tell CSW when it started.  Reports she has not been aggressive, but when told she has been with staff she says there must have been a reason.  Patient reports she keeps to herself and reports no problems recently.  Patient is unable to report why she is in the hospital and not able to tell who brought her to the hospital.  Patient alert to self, place, and situation.  No family at bedside and left message for daughter to call back.  Patient reports she loves  her apartment and wants to return.  Reports she does not want any strangers in her home (home health) and asking to leave today.  Reports she has felt weak, but uses a walker to get around and has her daughter, son or her friend take her to do errands such as buying groceries.  Patient is a current resident at [facility] however discussed with the staff the nursing home, they state she cannot come back, she has 24-hour monitoring by the family. Family states she cannot go home with them. She was placed in the hospital for further placement and evaluation the altered mental status.  Patient not open to SNF at this time, however has no other option.   Interpretive Summary/Anticipated DC Plan:    1. Displays some evidence of confusion per her report and not able to explain why she is currently in the hospital and medical regime.  Patient also not aware of her aggressive behavior and need for higher level of care.  Patient is alert and oriented and able to have a linear conversation about her day to day living and also remote history about living in [place]. 2.  No evidence of SI, HI, or AVH.  Patient reports she is depressed, no previous hospitalization for Psych and no medications at this time, but does report she is sad. 3.  Patient from ALF/ILF however due to level of care and not being able to go home with family, will need PT/OT to evaluate and patient may require placement in nursing home.  Patient not agreeable at this time, however not able to care for self. Patient has been to SNF in past at [facility], reports this would be acceptable if needing higher level of care.  Patient daughter also reports she has applied for Medicaid and awaiting acceptance letter. 4. No family at bedside, but spoke to Daughter via phone and daughter very supportive and knowledgeable of patient medical and metal problems.  Can be reached via phone [phone]) or cell: [phone].  Ashley Jacobs, MSW LCSW [phone]

## 2011-04-19 NOTE — Progress Notes (Signed)
Patient bed alarm was activated due to moderate fall risk.  Patient very aggressive and confrontational when entering the room to shut the bed alarm off.  Explained the need for the bed alarm and the importance of keeping her safe and the patient has demanded to have it turned off.  Patient is non-compliant with the use of the call bell and demands she can take care of herself.  She continues to curse and be beligerent with the staff.  Will continue to monitor during the night, but have complied with the patient wishes and turned the bed alarm off.Macarthur Critchley, RN
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
## 2011-04-19 NOTE — Progress Notes (Signed)
04/19/2011 [name] Case Management Note [phone]  Utilization review completed.  

## 2011-04-20 DIAGNOSIS — F17200 Nicotine dependence, unspecified, uncomplicated: Secondary | ICD-10-CM

## 2011-04-20 DIAGNOSIS — F39 Unspecified mood [affective] disorder: Secondary | ICD-10-CM

## 2011-04-20 DIAGNOSIS — F0391 Unspecified dementia with behavioral disturbance: Secondary | ICD-10-CM

## 2011-04-20 LAB — BASIC METABOLIC PANEL
BUN: 18 mg/dL (ref 6–23)
Calcium: 10 mg/dL (ref 8.4–10.5)
GFR calc Af Amer: 65 mL/min — ABNORMAL LOW (ref >90)
GFR calc non Af Amer: 56 mL/min — ABNORMAL LOW (ref >90)
Potassium: 3.7 mEq/L (ref 3.5–5.1)
Sodium: 144 mEq/L (ref 135–145)

## 2011-04-20 MED ORDER — ENOXAPARIN SODIUM 40 MG/0.4ML ~~LOC~~ SOLN
40.0000 mg | SUBCUTANEOUS | Status: DC
  Administered 2011-04-20 – 2011-04-21 (×2): 40 mg via SUBCUTANEOUS
  Filled 2011-04-20 (×3): qty 0.4

## 2011-04-20 MED ORDER — RISPERIDONE 1 MG PO TABS
1.0000 mg | ORAL_TABLET | Freq: Two times a day (BID) | ORAL | Status: DC
  Administered 2011-04-20 – 2011-04-22 (×5): 1 mg via ORAL
  Filled 2011-04-20 (×6): qty 1

## 2011-04-20 NOTE — Progress Notes (Signed)
Clinical Social Worker continuing to follow for disposition planning. Clinical Social Worker was notified by CM that pt does not meet inpatient criteria and Medicare will not cover SNF placement. Clinical Social Worker spoke with [facility] who stated that they could not accept pt back to facility because they are unable to provide the level of care the pt needs at this time. Per pt daughter, pt has applied for Medicaid. Clinical Social Worker confirmed [employer] application with financial counseling and discussed with [employer] the possibility of pt going to SNF as letter of guarantee and Designer, industrial/product. Clinical Social Worker contacted pt daughter to discuss placement through letter of guarantee process and re-initiated SNF search. Clinical Social Worker to follow up with pt daughter in regard to bed offers. Clinical Social Worker to facilitate pt discharge needs when bed available.  Jacklynn Lewis, MSW, LCSWA  Clinical Social Work [phone]

## 2011-04-20 NOTE — Progress Notes (Signed)
04/20/2011 [facility], Bosie Clos SPARKS Case Management Note [phone]   CARE MANAGEMENT NOTE  Patient:  [name], [place] MRN:  [id number]  Account Number:  [id number] Birth Date:  Nov 28, 1929  Admit Date:  04/18/2011      Patient does not meet Inpatient level of care.  Discussed with Dr. Charline Bills Magick-Myers and Dr. Leanna Battles ([facility] Physician),  who are in agreement. Terrilyn Saver. [place]  notified via delivery of "Medicare Outpatient Information Sheet".         Alexander Bergeron  [phone] _________________________________   04/20/2011 11:43:31 AM Case Manager Signature

## 2011-04-20 NOTE — Progress Notes (Signed)
Pt was seen and examined at bedside. I have reviewed labs and vitals which are stable and pt is hemodynamically stable. I agree with the above's assessment and plan. Plan is to proceed with SNF placement and perhaps d/c tomorrow AM.  Verta Ellen, Sherlon Handing  [phone]

## 2011-04-20 NOTE — Progress Notes (Signed)
Physical Therapy Treatment Patient Details Name: [name] MRN: [id number] DOB: 14-Jul-1929 Today's Date: 04/20/2011  PT Assessment/Plan  PT - Assessment/Plan Comments on Treatment Session: Pt making slow progress. PT Plan: Discharge plan needs to be updated PT Frequency: Min 2X/week Follow Up Recommendations: Skilled nursing facility Equipment Recommended: Defer to next venue PT Goals  Acute Rehab PT Goals PT Goal: Sit to Stand - Progress: Progressing toward goal PT Goal: Stand to Sit - Progress: Progressing toward goal PT Goal: Ambulate - Progress: Progressing toward goal  PT Treatment Precautions/Restrictions  Precautions Precautions: Fall Precaution Comments: Generalized imbalance Required Braces or Orthoses: No Restrictions Weight Bearing Restrictions: No Mobility (including Balance) Bed Mobility Supine to Sit: 5: Supervision;With rails;HOB flat Supine to Sit Details (indicate cue type and reason): incr time Sitting - Scoot to Edge of Bed: 5: Supervision Sitting - Scoot to Edge of Bed Details (indicate cue type and reason): incr time Transfers Sit to Stand: 5: Supervision;From bed;With upper extremity assist Sit to Stand Details (indicate cue type and reason): incr time Stand to Sit: 5: Supervision Stand to Sit Details: controlled descent Ambulation/Gait Ambulation/Gait Assistance: 5: Supervision Ambulation/Gait Assistance Details (indicate cue type and reason): pt using walker throughout Ambulation Distance (Feet): 60 Feet Assistive device: Rolling walker Gait Pattern: Decreased step length - right;Decreased step length - left Gait velocity: very slow cadence  Static Standing Balance Static Standing - Balance Support: No upper extremity supported;During functional activity (at sink ) Static Standing - Level of Assistance: 5: Stand by assistance Exercise    End of Session PT - End of Session Equipment Utilized During Treatment: Gait belt Activity  Tolerance: Patient limited by fatigue Patient left: in bed;with call bell in reach;with bed alarm set General Behavior During Session: [facility] for tasks performed Cognition: Impaired Cognitive Impairment: decr safety awareness  Cionna Collantes 04/20/2011, 10:09 AM  Skip Mayer PT [phone]
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
## 2011-04-20 NOTE — Evaluation (Signed)
Occupational Therapy Evaluation Patient Details Name: MCKINZEY ENTWISTLE MRN: [id number] DOB: 1929-06-07 Today's Date: 04/20/2011  Problem List:  Patient Active Problem List  Diagnoses  . MALLORY-WEISS SYNDROME  . GERD  . HIATAL HERNIA  . Hematochezia  . Acute kidney injury  . HTN (hypertension)  . Anemia due to blood loss, acute  . Generalized and unspecified atherosclerosis  . Generalized weakness  . Pyuria  . Psychosis    Past Medical History:  Past Medical History  Diagnosis Date  . Asthma   . Shortness of breath   . Hypertension   . Blood transfusion    Past Surgical History:  Past Surgical History  Procedure Date  . Colonoscopy 03/08/2011    Procedure: COLONOSCOPY;  Surgeon: Erick Blinks, MD;  Location: [facility] ENDOSCOPY;  Service: Gastroenterology;  Laterality: N/A;    OT Assessment/Plan/Recommendation OT Assessment Clinical Impression Statement: Pt s/p admit for MS changes and difficulty getting along w residents at her assisted living facitly with deficits in areas of cognition, reasoning, judgement and occasional balance deficits affecting I with basic adls.  Pt would benefit from cont. OT to increase I with adls back to baseline. OT Recommendation/Assessment: Patient will need skilled OT in the acute care venue OT Problem List: Decreased strength;Impaired balance (sitting and/or standing);Decreased safety awareness;Decreased knowledge of precautions;Pain Barriers to Discharge: Decreased caregiver support Barriers to Discharge Comments: Pt does not have 24 hour S at her facility. OT Therapy Diagnosis : Generalized weakness;Cognitive deficits;Altered mental status OT Plan OT Frequency: Min 1X/week OT Treatment/Interventions: Self-care/ADL training;Therapeutic activities OT Recommendation Follow Up Recommendations: Skilled nursing facility;Supervision/Assistance - 24 hour;Other (comment) (ALF memory care is an option.) Equipment Recommended: Defer to next  venue Individuals Consulted Consulted and Agree with Results and Recommendations: Patient unable/family or caregiver not available OT Goals Acute Rehab OT Goals OT Goal Formulation: Patient unable to participate in goal setting Time For Goal Achievement: 2 weeks ADL Goals Pt Will Perform Grooming: with modified independence;Standing at sink ADL Goal: Grooming - Progress: Goal set today Pt Will Perform Lower Body Bathing: with modified independence;Sit to stand from chair;Standing at sink;Unsupported ADL Goal: Lower Body Bathing - Progress: Goal set today Pt Will Perform Lower Body Dressing: with modified independence;Sit to stand from chair ADL Goal: Lower Body Dressing - Progress: Goal set today Pt Will Perform Tub/Shower Transfer: Shower transfer;with supervision;Ambulation;Shower seat without back ADL Goal: Web designer - Progress: Goal set today Additional ADL Goal #1: Pt will complete all aspects of toileting with S ADL Goal: Additional Goal #1 - Progress: Goal set today Miscellaneous OT Goals Miscellaneous OT Goal #1: Pt will state 2 things that she needs assist with during adls to attempt to build some insight into her deficits. OT Goal: Miscellaneous Goal #1 - Progress: Goal set today  OT Evaluation Precautions/Restrictions  Precautions Precautions: Fall Precaution Comments: Generalized imbalance Required Braces or Orthoses: No Restrictions Weight Bearing Restrictions: No Prior Functioning Home Living Lives With: Alone Type of Home: Assisted living Home Layout: One level Home Access: Level entry Bathroom Shower/Tub: Health visitor: Standard Bathroom Accessibility: Yes How Accessible: Accessible via walker Home Adaptive Equipment: Walker - rolling Additional Comments: Pt very adament about doing for herself. Prior Function Level of Independence: Independent with basic ADLs;Independent with homemaking with ambulation;Other (comment) (not sure  how accurate this is.) Driving: No Vocation: Retired ADL ADL Eating/Feeding: Performed;Set up Where Assessed - Eating/Feeding: Edge of bed Grooming: Performed;Teeth care;Wash/dry hands;Wash/dry face;Supervision/safety Grooming Details (indicate cue type and reason): S only when  on feet. Where Assessed - Grooming: Standing at sink Upper Body Bathing: Performed;Set up Where Assessed - Upper Body Bathing: Unsupported;Sit to stand from bed;Sitting, bed Lower Body Bathing: Performed;Set up;Supervision/safety Lower Body Bathing Details (indicate cue type and reason): S only when standing. Where Assessed - Lower Body Bathing: Sit to stand from bed Upper Body Dressing: Simulated;Minimal assistance Upper Body Dressing Details (indicate cue type and reason): shoulders hurt making UE dressing difficult. Where Assessed - Upper Body Dressing: Sitting, bed Lower Body Dressing: Simulated;Minimal assistance Lower Body Dressing Details (indicate cue type and reason): min assist for socks and shoes. Where Assessed - Lower Body Dressing: Sit to stand from chair Toilet Transfer: Performed;Supervision/safety Toilet Transfer Method: Proofreader: Comfort height toilet;Grab bars Toileting - Clothing Manipulation: Simulated;Supervision/safety Where Assessed - Glass blower/designer Manipulation: Standing Toileting - Hygiene: Performed;Supervision/safety Where Assessed - Toileting Hygiene: Sit on 3-in-1 or toilet Tub/Shower Transfer: Not assessed Tub/Shower Transfer Method: Not assessed Equipment Used: Rolling walker Ambulation Related to ADLs: Pt slow walking with walker but no LOB noted. ADL Comments: Pt overall S with adls.  Pt's daughter stated pt was having difficulty at assisted living with staff and residents.  Basic adls require S secondary to pts judgement. Vision/Perception    Cognition Cognition Arousal/Alertness: Awake/alert Overall Cognitive Status: History of cognitive  impairments History of Cognitive Impairment: Appears at baseline functioning Memory: Appears impaired Memory Deficits: Pt with decreased memory of  events and commands during session.  Orientation Level: Oriented to person;Disoriented to place;Disoriented to time;Disoriented to situation Safety/Judgement: Decreased awareness of safety precautions;Decreased safety judgement for tasks assessed Decreased Safety/Judgement: Impulsive;Decreased awareness of need for assistance Safety/Judgement - Other Comments: Pt has always been independent therefore feels like she wants no assist here at hospital although she needs it. Awareness of Deficits: Decreased awareness of deficits Sensation/Coordination Sensation Light Touch: Appears Intact Coordination Gross Motor Movements are Fluid and Coordinated: Yes Fine Motor Movements are Fluid and Coordinated: Yes Extremity Assessment RUE Assessment RUE Assessment: Exceptions to [facility] RUE Strength RUE Overall Strength: Due to pain;Unable to assess;Other (Comment) (Pt w B shoulder pain (chronic)) LUE Assessment LUE Assessment: Exceptions to [facility] LUE Strength LUE Overall Strength: Due to pain;Unable to assess;Other (Comment) (pt with B chronic shoulder pain.) Mobility  Bed Mobility Bed Mobility: No Supine to Sit: 5: Supervision;With rails;HOB flat Supine to Sit Details (indicate cue type and reason): incr time Sitting - Scoot to Edge of Bed: 5: Supervision Sitting - Scoot to Edge of Bed Details (indicate cue type and reason): incr time Transfers Transfers: Yes Sit to Stand: 5: Supervision;From bed;With upper extremity assist Sit to Stand Details (indicate cue type and reason): incr time Stand to Sit: 5: Supervision Stand to Sit Details: controlled descent Exercises   End of Session OT - End of Session Activity Tolerance: Patient tolerated treatment well Patient left: in bed;with bed alarm set Nurse Communication: Mobility status for transfers;Other  (comment) (communicated shoulder pain.) General Behavior During Session: [facility] for tasks performed Cognition: Impaired Cognitive Impairment: decr safety awareness   Hope Budds  [phone] 04/20/2011, 10:24 AM
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
## 2011-04-20 NOTE — Progress Notes (Signed)
Clinical Social Work has reviewed psych notes and discussed case with psych MD.  Patient not able to take care of self and needs higher level of care.  Completed psychosocial and placed in shadow chart along with placement note and FL2.  MD please sign.  Patient was faxed out in [facility] with daughters permission.  Completed handoff to attending triad CSW.  Please call with any questions or concerns.  Ashley Jacobs, MSW LCSW [phone]

## 2011-04-20 NOTE — Progress Notes (Signed)
Patient resting, bed alarm reactivated for patient safety.  Will continue to monitor.Macarthur Critchley, RN

## 2011-04-21 LAB — GLUCOSE, CAPILLARY: Glucose-Capillary: 100 mg/dL — ABNORMAL HIGH (ref 70–99)

## 2011-04-21 MED ORDER — LORAZEPAM 0.5 MG PO TABS: 0.2500 mg | ORAL_TABLET | Freq: Four times a day (QID) | ORAL | Status: DC | PRN

## 2011-04-21 MED ORDER — ACETAMINOPHEN 325 MG PO TABS: 650.0000 mg | ORAL_TABLET | ORAL | Status: DC | PRN

## 2011-04-21 NOTE — Progress Notes (Signed)
Clinical Social Worker continuing to follow for discharge planning. Pt has one bed offer at [facility]. Pt daughter notified of bed offer and planning on visiting facility this evening. Pt daughter aware pt is medically stable for discharge and plan for discharge tomorrow with a letter of guarantee. Clinical Social Worker to facilitate pt discharge needs.  Jacklynn Lewis, MSW, LCSWA  Clinical Social Work [phone]

## 2011-04-21 NOTE — Progress Notes (Signed)
Clinical Social Worker contacted pt daughter via telephone to discuss pt discharge plans. Pt currently has not bed offers for SNF in [facility] as pt will be going to SNF as a letter of guarantee. Discussed with pt daughter about expanding the SNF search to other counties and pt daughter agreeable to SNF search in [place] and [place]. Clinical Child psychotherapist initiated [facility] in [place] [employer]. Clinical Social Worker to follow up with pt daughter in regard to bed offers and facilitate pt discharge needs when bed available.  Jacklynn Lewis, MSW, LCSWA  Clinical Social Work [phone]
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
## 2011-04-22 LAB — GLUCOSE, CAPILLARY: Glucose-Capillary: 118 mg/dL — ABNORMAL HIGH (ref 70–99)

## 2011-04-22 MED ORDER — LORAZEPAM 0.5 MG PO TABS: 0.2500 mg | ORAL_TABLET | Freq: Every day | ORAL | Status: DC | PRN

## 2011-04-22 MED ORDER — RISPERIDONE 1 MG PO TABS: 1.0000 mg | ORAL_TABLET | Freq: Two times a day (BID) | ORAL | Status: DC
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
## 2011-04-22 NOTE — Progress Notes (Signed)
Clinical Social Worker facilitated pt discharge needs including contacting facility and family. Per pt daughter, [name], pt daughter plans to transport pt to facility and stated that she plans to be at the hospital between 2pm and 3pm. Clinical Social Worker left discharge packet in [place] and notified RN of need to provide pt daughter discharge packet at transport. Clinical Social Worker contacted [employer] at pt daughter request and clarified facilities questions and facility stated that they were going to follow along with pt for consideration of pt returning after short term SNF stay. Pt to be discharged today to [facility] as a letter of guarantee. Clinical Social Worker contacted pt daughter and left a message to inform that all discharge documentation was together for pt daughter to transport when she arrived to hospital. No further social work needs at this time. Clinical Social Worker signing off.   Jacklynn Lewis, MSW, LCSWA  Clinical Social Work [phone]

## 2011-04-22 NOTE — Progress Notes (Signed)
Clinical Social Worker spoke with pt daughter in regard to pt discharge needs. Pt daughter agreeable to pt transferring to [facility] today. Pt daughter reports that she would like to transport pt to facility and plans to be at hospital between 2 and 3 pm. MD notified. Clinical Social Worker to facilitate pt discharge needs.   Jacklynn Lewis, MSW, LCSWA  Clinical Social Work [phone]

## 2011-04-22 NOTE — Progress Notes (Signed)
IV removed, pressure and dressing applied, site unremarkable.  Pt discharging to [facility] per MD order.  Discharge packet sent with pt.  Pt transported in wheelchair by RN.  Pt traveled to SNF with daughter by private vehicle.

## 2011-07-17 NOTE — Progress Notes (Signed)
[name] (Cheek) Clariza Sickman PT, DPT Acute Rehabilitation [phone]  

## 2011-09-14 ENCOUNTER — Other Ambulatory Visit (HOSPITAL_COMMUNITY): Payer: Medicare Other

## 2011-09-14 ENCOUNTER — Ambulatory Visit (HOSPITAL_COMMUNITY): Admission: RE | Admit: 2011-09-14 | Payer: Medicare Other | Source: Ambulatory Visit

## 2011-09-21 ENCOUNTER — Ambulatory Visit (HOSPITAL_COMMUNITY)
Admission: RE | Admit: 2011-09-21 | Discharge: 2011-09-21 | Disposition: A | Discharge status: Home / Self Care | Payer: Medicare Other | Source: Ambulatory Visit | Attending: Internal Medicine | Admitting: Internal Medicine

## 2011-09-21 ENCOUNTER — Other Ambulatory Visit (HOSPITAL_COMMUNITY): Payer: Medicare Other

## 2011-09-21 DIAGNOSIS — F0390 Unspecified dementia without behavioral disturbance: Secondary | ICD-10-CM | POA: Insufficient documentation

## 2011-09-21 DIAGNOSIS — R05 Cough: Secondary | ICD-10-CM

## 2011-09-21 DIAGNOSIS — R059 Cough, unspecified: Secondary | ICD-10-CM | POA: Insufficient documentation

## 2011-09-21 DIAGNOSIS — J45909 Unspecified asthma, uncomplicated: Secondary | ICD-10-CM | POA: Insufficient documentation

## 2011-09-21 DIAGNOSIS — I10 Essential (primary) hypertension: Secondary | ICD-10-CM | POA: Insufficient documentation

## 2011-09-21 DIAGNOSIS — R0602 Shortness of breath: Secondary | ICD-10-CM | POA: Insufficient documentation

## 2011-09-21 DIAGNOSIS — R1310 Dysphagia, unspecified: Secondary | ICD-10-CM | POA: Insufficient documentation

## 2011-09-21 NOTE — Procedures (Signed)
Objective Swallowing Evaluation: Modified Barium Swallowing Study  Patient Details  Name: MORGYN MARUT MRN: [id number] Date of Birth: October 20, 1929  Today's Date: 09/21/2011 Time: 1115-1130 SLP Time Calculation (min): 15 min  Past Medical History:  Past Medical History  Diagnosis Date  . Asthma   . Shortness of breath   . Hypertension   . Blood transfusion    Past Surgical History:  Past Surgical History  Procedure Date  . Colonoscopy 03/08/2011    Procedure: COLONOSCOPY;  Surgeon: Erick Blinks, MD;  Location: [facility] ENDOSCOPY;  Service: Gastroenterology;  Laterality: N/A;   HPI:  78 year old female with PMH of dementia, esophageal reflux, HTN, diastolic heart failure, asthma, alchoholism seen for OP MBS due to c/o coughing with liquids.      Assessment / Plan / Recommendation Clinical Impression  Dysphagia Diagnosis: Within Functional Limits Clinical impression: Patient presents with overall swallowing abilities that are [facility] for age. Timing is appropriate and only very trace and intermittent base of tongue/vallecular residuals are present post swallow with independent clearance. Recommend continuing current diet. ? involvement of reflux on current bedside presentation/noted coughing with liquids.     Treatment Recommendation  Defer treatment plan to SLP at (Comment) (SNF)    Diet Recommendation Regular;Thin liquid   Liquid Administration via: Cup;Straw Medication Administration: Whole meds with liquid Supervision: Patient able to self feed Compensations: Slow rate;Small sips/bites Postural Changes and/or Swallow Maneuvers: Seated upright 90 degrees;Upright 30-60 min after meal    Other  Recommendations Oral Care Recommendations: Oral care BID                 General HPI: 78 year old female with PMH of dementia, esophageal reflux, HTN, diastolic heart failure, asthma, alchoholism seen for OP MBS due to c/o coughing with liquids.  Type of Study: Modified Barium Swallowing  Study Reason for Referral: Objectively evaluate swallowing function Diet Prior to this Study: Regular;Thin liquids Temperature Spikes Noted: No Respiratory Status: Room air History of Recent Intubation: No Behavior/Cognition: Alert;Cooperative;Pleasant mood Oral Cavity - Dentition:  (partials) Oral Motor / Sensory Function: Within functional limits Self-Feeding Abilities: Able to feed self Patient Positioning: Upright in chair Baseline Vocal Quality: Clear Volitional Cough: Strong Volitional Swallow: Able to elicit Anatomy: Within functional limits Pharyngeal Secretions: Not observed secondary MBS    Reason for Referral Objectively evaluate swallowing function   Ferdinand Lango MA, CCC-SLP [phone]           Defne Gerling Meryl 09/21/2011, 3:19 PM

## 2012-06-06 DIAGNOSIS — F339 Major depressive disorder, recurrent, unspecified: Secondary | ICD-10-CM

## 2012-06-06 DIAGNOSIS — M47817 Spondylosis without myelopathy or radiculopathy, lumbosacral region: Secondary | ICD-10-CM

## 2012-06-29 ENCOUNTER — Other Ambulatory Visit: Payer: Self-pay | Admitting: Family Medicine

## 2012-06-29 DIAGNOSIS — M545 Low back pain: Secondary | ICD-10-CM

## 2012-07-19 ENCOUNTER — Other Ambulatory Visit: Payer: Self-pay | Admitting: Geriatric Medicine

## 2012-07-19 MED ORDER — MORPHINE SULFATE 15 MG PO TABS: 15.0000 mg | ORAL_TABLET | Freq: Two times a day (BID) | ORAL | Status: DC

## 2012-07-25 DIAGNOSIS — M47817 Spondylosis without myelopathy or radiculopathy, lumbosacral region: Secondary | ICD-10-CM

## 2012-07-25 DIAGNOSIS — K219 Gastro-esophageal reflux disease without esophagitis: Secondary | ICD-10-CM

## 2012-07-25 DIAGNOSIS — M810 Age-related osteoporosis without current pathological fracture: Secondary | ICD-10-CM

## 2012-08-01 ENCOUNTER — Other Ambulatory Visit: Payer: Self-pay | Admitting: Family Medicine

## 2012-08-01 DIAGNOSIS — M545 Low back pain: Secondary | ICD-10-CM

## 2012-08-04 ENCOUNTER — Ambulatory Visit
Admission: RE | Admit: 2012-08-04 | Discharge: 2012-08-04 | Disposition: A | Discharge status: Home / Self Care | Payer: Medicare Other | Source: Ambulatory Visit | Attending: Family Medicine | Admitting: Family Medicine

## 2012-08-04 DIAGNOSIS — M545 Low back pain: Secondary | ICD-10-CM

## 2012-08-14 DIAGNOSIS — I130 Hypertensive heart and chronic kidney disease with heart failure and stage 1 through stage 4 chronic kidney disease, or unspecified chronic kidney disease: Secondary | ICD-10-CM

## 2012-08-14 DIAGNOSIS — N189 Chronic kidney disease, unspecified: Secondary | ICD-10-CM

## 2012-08-14 DIAGNOSIS — M47817 Spondylosis without myelopathy or radiculopathy, lumbosacral region: Secondary | ICD-10-CM

## 2012-08-14 DIAGNOSIS — J45909 Unspecified asthma, uncomplicated: Secondary | ICD-10-CM

## 2012-08-14 DIAGNOSIS — I509 Heart failure, unspecified: Secondary | ICD-10-CM

## 2012-08-18 ENCOUNTER — Other Ambulatory Visit: Payer: Self-pay | Admitting: Geriatric Medicine

## 2012-08-18 ENCOUNTER — Other Ambulatory Visit (HOSPITAL_COMMUNITY): Payer: Self-pay | Admitting: Internal Medicine

## 2012-08-18 DIAGNOSIS — R0602 Shortness of breath: Secondary | ICD-10-CM

## 2012-08-18 MED ORDER — MORPHINE SULFATE 15 MG PO TABS: 15.0000 mg | ORAL_TABLET | Freq: Two times a day (BID) | ORAL | Status: DC

## 2012-08-23 ENCOUNTER — Ambulatory Visit (HOSPITAL_COMMUNITY)
Admission: RE | Admit: 2012-08-23 | Discharge: 2012-08-23 | Disposition: A | Discharge status: Home / Self Care | Payer: Medicare Other | Source: Ambulatory Visit | Attending: Internal Medicine | Admitting: Internal Medicine

## 2012-08-23 DIAGNOSIS — R0602 Shortness of breath: Secondary | ICD-10-CM | POA: Insufficient documentation

## 2012-08-23 LAB — PULMONARY FUNCTION TEST

## 2012-08-23 MED ORDER — ALBUTEROL SULFATE (5 MG/ML) 0.5% IN NEBU
2.5000 mg | INHALATION_SOLUTION | Freq: Once | RESPIRATORY_TRACT | Status: AC
  Administered 2012-08-23: 2.5 mg via RESPIRATORY_TRACT

## 2012-09-19 ENCOUNTER — Other Ambulatory Visit: Payer: Self-pay | Admitting: Geriatric Medicine

## 2012-09-19 ENCOUNTER — Non-Acute Institutional Stay (SKILLED_NURSING_FACILITY): Payer: PRIVATE HEALTH INSURANCE | Admitting: Internal Medicine

## 2012-09-19 DIAGNOSIS — M4716 Other spondylosis with myelopathy, lumbar region: Secondary | ICD-10-CM

## 2012-09-19 DIAGNOSIS — J45909 Unspecified asthma, uncomplicated: Secondary | ICD-10-CM

## 2012-09-19 MED ORDER — MORPHINE SULFATE 15 MG PO TABS: 15.0000 mg | ORAL_TABLET | Freq: Two times a day (BID) | ORAL | Status: DC

## 2012-09-19 NOTE — Progress Notes (Signed)
Patient ID: [name], female   DOB: 1929-06-07, 79 y.o.   MRN: [id number] Facility; [facility]. Chief complaint; [facility] monthly visit for June. Followup back pain. History; patient has been in the building since February of 2013. Major issue recently has been lumbar spinal stenosis without myelopathy. She has been suggested for an epidural steroid injection. Which was done on 09/11/2012. States major improvement.    has a past medical history of Asthma; Shortness of breath; Hypertension; and Blood transfusion, lumbar spinal stenosis, chronic diastolic heart failure, chronic renal failure stage II, hypothyroidism, on replacement, depression, hypercholesterolemia  Past Surgical History  Procedure Laterality Date  . Colonoscopy  03/08/2011    Procedure: COLONOSCOPY;  Surgeon: Erick Blinks, MD;  Location: [facility] ENDOSCOPY;  Service: Gastroenterology;  Laterality: N/A;  Medication list is reviewed;  Physical exam. Respiratory clear entry bilaterally. Cardiac.2/6 short midsystolic murmer, non radiating. Neurologic; able to stand however, very ataxic gait. Mostly, propels herself in the facility in a wheelchair.  Impression/plan  #1 lumbar spinal stenosis. Much improved with recent epidural injection. #2 asthma. This has not been unstable. She is on an Advair Diskus.

## 2012-09-20 ENCOUNTER — Other Ambulatory Visit: Payer: Self-pay | Admitting: Geriatric Medicine

## 2012-09-20 MED ORDER — MORPHINE SULFATE 15 MG PO TABS: 15.0000 mg | ORAL_TABLET | Freq: Two times a day (BID) | ORAL | Status: DC

## 2012-10-05 ENCOUNTER — Telehealth: Payer: Self-pay | Admitting: Emergency Medicine

## 2012-10-05 NOTE — Telephone Encounter (Signed)
Called and spoke with Ebony Cargo, NP at the nursing home for the pt.  She stated that RB read the pts PFT results from June 2014.  She stated that the pt is only on advair bid and albuterol prn but the pt never asks for this.  Darl Pikes wanted to call and make sure that the pt should not be on any other medication since she has the severe airway obstruction, but the pt is asymptomatic at this time--no wheezing or SOB.  Darl Pikes wanted to see if the pt should be on the long acting meds---she is trying to go by the Gold standard.  RB please advise. Thanks  Allergies  Allergen Reactions  . Penicillins Other (See Comments)    unknown

## 2012-10-05 NOTE — Telephone Encounter (Signed)
Please let her know that Advair is a good choice, but that the patient may also benefit from Spiriva daily. It is hard to know whether to do this since she is asymptomatic. If she clinically believes a 2nd med might help the pt, then I would add Spiriva for a month and see if she benefits. Thanks

## 2012-10-05 NOTE — Telephone Encounter (Signed)
lmomtcb for [name]

## 2012-10-05 NOTE — Telephone Encounter (Signed)
Spoke with [name].  Informed her of below per RB.  She verbalized understanding and had no further questions or concerns at this time.

## 2012-10-11 ENCOUNTER — Emergency Department (HOSPITAL_COMMUNITY): Payer: PRIVATE HEALTH INSURANCE

## 2012-10-11 ENCOUNTER — Encounter (HOSPITAL_COMMUNITY): Payer: Self-pay

## 2012-10-11 ENCOUNTER — Inpatient Hospital Stay (HOSPITAL_COMMUNITY)
Admission: EM | Admit: 2012-10-11 | Discharge: 2012-10-16 | DRG: 392 | Payer: PRIVATE HEALTH INSURANCE | Attending: Internal Medicine | Admitting: Internal Medicine

## 2012-10-11 ENCOUNTER — Observation Stay (HOSPITAL_COMMUNITY): Payer: PRIVATE HEALTH INSURANCE

## 2012-10-11 DIAGNOSIS — F0391 Unspecified dementia with behavioral disturbance: Secondary | ICD-10-CM | POA: Diagnosis present

## 2012-10-11 DIAGNOSIS — F29 Unspecified psychosis not due to a substance or known physiological condition: Secondary | ICD-10-CM

## 2012-10-11 DIAGNOSIS — K5900 Constipation, unspecified: Secondary | ICD-10-CM

## 2012-10-11 DIAGNOSIS — J45909 Unspecified asthma, uncomplicated: Secondary | ICD-10-CM | POA: Diagnosis present

## 2012-10-11 DIAGNOSIS — G4700 Insomnia, unspecified: Secondary | ICD-10-CM

## 2012-10-11 DIAGNOSIS — F1021 Alcohol dependence, in remission: Secondary | ICD-10-CM

## 2012-10-11 DIAGNOSIS — R112 Nausea with vomiting, unspecified: Secondary | ICD-10-CM | POA: Diagnosis present

## 2012-10-11 DIAGNOSIS — I10 Essential (primary) hypertension: Secondary | ICD-10-CM | POA: Diagnosis present

## 2012-10-11 DIAGNOSIS — K226 Gastro-esophageal laceration-hemorrhage syndrome: Secondary | ICD-10-CM

## 2012-10-11 DIAGNOSIS — E785 Hyperlipidemia, unspecified: Secondary | ICD-10-CM | POA: Diagnosis present

## 2012-10-11 DIAGNOSIS — D62 Acute posthemorrhagic anemia: Secondary | ICD-10-CM

## 2012-10-11 DIAGNOSIS — R0789 Other chest pain: Secondary | ICD-10-CM | POA: Diagnosis present

## 2012-10-11 DIAGNOSIS — F03918 Unspecified dementia, unspecified severity, with other behavioral disturbance: Secondary | ICD-10-CM | POA: Diagnosis present

## 2012-10-11 DIAGNOSIS — R079 Chest pain, unspecified: Secondary | ICD-10-CM | POA: Diagnosis present

## 2012-10-11 DIAGNOSIS — I359 Nonrheumatic aortic valve disorder, unspecified: Secondary | ICD-10-CM | POA: Diagnosis present

## 2012-10-11 DIAGNOSIS — Z66 Do not resuscitate: Secondary | ICD-10-CM | POA: Diagnosis present

## 2012-10-11 DIAGNOSIS — F0280 Dementia in other diseases classified elsewhere without behavioral disturbance: Secondary | ICD-10-CM | POA: Diagnosis present

## 2012-10-11 DIAGNOSIS — R109 Unspecified abdominal pain: Secondary | ICD-10-CM | POA: Diagnosis present

## 2012-10-11 DIAGNOSIS — Z87891 Personal history of nicotine dependence: Secondary | ICD-10-CM

## 2012-10-11 DIAGNOSIS — E860 Dehydration: Secondary | ICD-10-CM | POA: Diagnosis present

## 2012-10-11 DIAGNOSIS — I7090 Unspecified atherosclerosis: Secondary | ICD-10-CM

## 2012-10-11 DIAGNOSIS — R911 Solitary pulmonary nodule: Secondary | ICD-10-CM | POA: Diagnosis present

## 2012-10-11 DIAGNOSIS — K449 Diaphragmatic hernia without obstruction or gangrene: Secondary | ICD-10-CM

## 2012-10-11 DIAGNOSIS — R5381 Other malaise: Secondary | ICD-10-CM | POA: Diagnosis present

## 2012-10-11 DIAGNOSIS — I5189 Other ill-defined heart diseases: Secondary | ICD-10-CM

## 2012-10-11 DIAGNOSIS — R8281 Pyuria: Secondary | ICD-10-CM

## 2012-10-11 DIAGNOSIS — N179 Acute kidney failure, unspecified: Secondary | ICD-10-CM

## 2012-10-11 DIAGNOSIS — Z993 Dependence on wheelchair: Secondary | ICD-10-CM

## 2012-10-11 DIAGNOSIS — K921 Melena: Secondary | ICD-10-CM

## 2012-10-11 DIAGNOSIS — I350 Nonrheumatic aortic (valve) stenosis: Secondary | ICD-10-CM | POA: Diagnosis present

## 2012-10-11 DIAGNOSIS — R531 Weakness: Secondary | ICD-10-CM

## 2012-10-11 DIAGNOSIS — G309 Alzheimer's disease, unspecified: Secondary | ICD-10-CM | POA: Diagnosis present

## 2012-10-11 DIAGNOSIS — K219 Gastro-esophageal reflux disease without esophagitis: Principal | ICD-10-CM | POA: Diagnosis present

## 2012-10-11 HISTORY — DX: Unspecified dementia, unspecified severity, with other behavioral disturbance: F03.918

## 2012-10-11 HISTORY — DX: Essential (primary) hypertension: I10

## 2012-10-11 HISTORY — DX: Hypokalemia: E87.6

## 2012-10-11 HISTORY — DX: Constipation, unspecified: K59.00

## 2012-10-11 HISTORY — DX: Nonrheumatic aortic (valve) stenosis: I35.0

## 2012-10-11 HISTORY — DX: Hyperlipidemia, unspecified: E78.5

## 2012-10-11 HISTORY — DX: Anemia, unspecified: D64.9

## 2012-10-11 HISTORY — DX: Unspecified dementia with behavioral disturbance: F03.91

## 2012-10-11 HISTORY — DX: Gastro-esophageal reflux disease without esophagitis: K21.9

## 2012-10-11 HISTORY — DX: Other ill-defined heart diseases: I51.89

## 2012-10-11 HISTORY — DX: Alcohol dependence, in remission: F10.21

## 2012-10-11 HISTORY — DX: Unspecified asthma, uncomplicated: J45.909

## 2012-10-11 LAB — HEPATIC FUNCTION PANEL
ALT: 9 U/L (ref 0–35)
AST: 16 U/L (ref 0–37)
Alkaline Phosphatase: 81 U/L (ref 39–117)
Bilirubin, Direct: <0.1 mg/dL (ref 0.0–0.3)

## 2012-10-11 LAB — CBC
HCT: 41.7 % (ref 36.0–46.0)
HCT: 42.1 % (ref 36.0–46.0)
Hemoglobin: 14.6 g/dL (ref 12.0–15.0)
MCHC: 33.1 g/dL (ref 30.0–36.0)
Platelets: 326 10*3/uL (ref 150–400)
RDW: 17.5 % — ABNORMAL HIGH (ref 11.5–15.5)
RDW: 17.4 % — ABNORMAL HIGH (ref 11.5–15.5)
WBC: 9.1 10*3/uL (ref 4.0–10.5)
WBC: 10.4 10*3/uL (ref 4.0–10.5)

## 2012-10-11 LAB — POCT I-STAT TROPONIN I: Troponin i, poc: 0.02 ng/mL (ref 0.00–0.08)

## 2012-10-11 LAB — BASIC METABOLIC PANEL
BUN: 14 mg/dL (ref 6–23)
Chloride: 103 mEq/L (ref 96–112)
GFR calc Af Amer: 77 mL/min — ABNORMAL LOW (ref >90)
GFR calc non Af Amer: 67 mL/min — ABNORMAL LOW (ref >90)
Potassium: 3.8 mEq/L (ref 3.5–5.1)

## 2012-10-11 LAB — CREATININE, SERUM
Creatinine, Ser: 0.79 mg/dL (ref 0.50–1.10)
GFR calc Af Amer: 89 mL/min — ABNORMAL LOW (ref >90)
GFR calc non Af Amer: 77 mL/min — ABNORMAL LOW (ref >90)

## 2012-10-11 LAB — URINALYSIS, ROUTINE W REFLEX MICROSCOPIC
Glucose, UA: NEGATIVE mg/dL
Hgb urine dipstick: NEGATIVE
Protein, ur: 30 mg/dL — AB

## 2012-10-11 LAB — PRO B NATRIURETIC PEPTIDE: Pro B Natriuretic peptide (BNP): 357.3 pg/mL (ref 0–450)

## 2012-10-11 LAB — D-DIMER, QUANTITATIVE: D-Dimer, Quant: 6.03 ug/mL-FEU — ABNORMAL HIGH (ref 0.00–0.48)

## 2012-10-11 LAB — URINE MICROSCOPIC-ADD ON

## 2012-10-11 MED ORDER — ALBUTEROL SULFATE (5 MG/ML) 0.5% IN NEBU: 2.5000 mg | INHALATION_SOLUTION | RESPIRATORY_TRACT | Status: DC | PRN

## 2012-10-11 MED ORDER — POTASSIUM CHLORIDE IN NACL 20-0.9 MEQ/L-% IV SOLN
INTRAVENOUS | Status: DC
  Administered 2012-10-11 – 2012-10-13 (×3): via INTRAVENOUS
  Filled 2012-10-11 (×6): qty 1000

## 2012-10-11 MED ORDER — ONDANSETRON HCL 4 MG/2ML IJ SOLN
4.0000 mg | Freq: Once | INTRAMUSCULAR | Status: AC
  Administered 2012-10-11: 4 mg via INTRAVENOUS

## 2012-10-11 MED ORDER — FLEET ENEMA 7-19 GM/118ML RE ENEM
1.0000 | ENEMA | Freq: Every day | RECTAL | Status: DC | PRN
  Filled 2012-10-11: qty 1

## 2012-10-11 MED ORDER — HEPARIN SODIUM (PORCINE) 5000 UNIT/ML IJ SOLN
5000.0000 [IU] | Freq: Three times a day (TID) | INTRAMUSCULAR | Status: DC
  Administered 2012-10-11 – 2012-10-16 (×15): 5000 [IU] via SUBCUTANEOUS
  Filled 2012-10-11 (×19): qty 1

## 2012-10-11 MED ORDER — ONDANSETRON HCL 4 MG/2ML IJ SOLN
4.0000 mg | Freq: Once | INTRAMUSCULAR | Status: AC
  Administered 2012-10-11: 4 mg via INTRAVENOUS
  Filled 2012-10-11: qty 2

## 2012-10-11 MED ORDER — PANTOPRAZOLE SODIUM 40 MG IV SOLR
40.0000 mg | Freq: Two times a day (BID) | INTRAVENOUS | Status: DC
  Administered 2012-10-11 – 2012-10-12 (×3): 40 mg via INTRAVENOUS
  Filled 2012-10-11 (×4): qty 40

## 2012-10-11 MED ORDER — DARIFENACIN HYDROBROMIDE ER 7.5 MG PO TB24
7.5000 mg | ORAL_TABLET | Freq: Every day | ORAL | Status: DC
  Administered 2012-10-12 – 2012-10-16 (×5): 7.5 mg via ORAL
  Filled 2012-10-11 (×6): qty 1

## 2012-10-11 MED ORDER — ACETAMINOPHEN 325 MG PO TABS: 650.0000 mg | ORAL_TABLET | Freq: Three times a day (TID) | ORAL | Status: DC | PRN

## 2012-10-11 MED ORDER — AMLODIPINE BESYLATE 5 MG PO TABS
5.0000 mg | ORAL_TABLET | Freq: Every day | ORAL | Status: DC
Start: 2012-10-11 — End: 2012-10-16
  Administered 2012-10-12 – 2012-10-16 (×5): 5 mg via ORAL
  Filled 2012-10-11 (×6): qty 1

## 2012-10-11 MED ORDER — LEVOTHYROXINE SODIUM 100 MCG PO TABS
100.0000 ug | ORAL_TABLET | Freq: Every day | ORAL | Status: DC
  Administered 2012-10-12 – 2012-10-16 (×5): 100 ug via ORAL
  Filled 2012-10-11 (×6): qty 1

## 2012-10-11 MED ORDER — ONDANSETRON HCL 4 MG/2ML IJ SOLN
INTRAMUSCULAR | Status: AC
  Filled 2012-10-11: qty 2

## 2012-10-11 MED ORDER — IOHEXOL 350 MG/ML SOLN: 100.0000 mL | Freq: Once | INTRAVENOUS | Status: AC | PRN

## 2012-10-11 MED ORDER — POLYETHYL GLYCOL-PROPYL GLYCOL 0.4-0.3 % OP SOLN: 1.0000 [drp] | Freq: Three times a day (TID) | OPHTHALMIC | Status: DC

## 2012-10-11 MED ORDER — FENTANYL CITRATE 0.05 MG/ML IJ SOLN
25.0000 ug | Freq: Once | INTRAMUSCULAR | Status: AC
  Administered 2012-10-11: 25 ug via INTRAVENOUS
  Filled 2012-10-11: qty 2

## 2012-10-11 MED ORDER — ONDANSETRON HCL 4 MG/2ML IJ SOLN
4.0000 mg | Freq: Four times a day (QID) | INTRAMUSCULAR | Status: AC | PRN
  Administered 2012-10-11: 4 mg via INTRAVENOUS
  Filled 2012-10-11: qty 2

## 2012-10-11 MED ORDER — RISPERIDONE 0.25 MG PO TABS
0.1250 mg | ORAL_TABLET | Freq: Every day | ORAL | Status: DC
  Administered 2012-10-11 – 2012-10-13 (×3): 0.125 mg via ORAL
  Filled 2012-10-11 (×4): qty 0.5

## 2012-10-11 MED ORDER — MEMANTINE HCL ER 28 MG PO CP24
1.0000 | ORAL_CAPSULE | Freq: Every day | ORAL | Status: DC
  Administered 2012-10-12 – 2012-10-16 (×5): 28 mg via ORAL
  Filled 2012-10-11 (×6): qty 28

## 2012-10-11 MED ORDER — ONDANSETRON HCL 4 MG/2ML IJ SOLN
INTRAMUSCULAR | Status: AC
  Administered 2012-10-11: 4 mg
  Filled 2012-10-11: qty 2

## 2012-10-11 MED ORDER — BISACODYL 10 MG RE SUPP
10.0000 mg | Freq: Every day | RECTAL | Status: DC
  Administered 2012-10-12: 10 mg via RECTAL
  Filled 2012-10-11 (×2): qty 1

## 2012-10-11 MED ORDER — MOMETASONE FURO-FORMOTEROL FUM 100-5 MCG/ACT IN AERO
2.0000 | INHALATION_SPRAY | Freq: Two times a day (BID) | RESPIRATORY_TRACT | Status: DC
  Administered 2012-10-11 – 2012-10-15 (×7): 2 via RESPIRATORY_TRACT
  Filled 2012-10-11: qty 8.8

## 2012-10-11 MED ORDER — HYDROMORPHONE HCL PF 1 MG/ML IJ SOLN
1.0000 mg | INTRAMUSCULAR | Status: DC | PRN
  Administered 2012-10-11: 1 mg via INTRAVENOUS
  Filled 2012-10-11: qty 1

## 2012-10-11 MED ORDER — METOPROLOL TARTRATE 25 MG PO TABS
25.0000 mg | ORAL_TABLET | Freq: Two times a day (BID) | ORAL | Status: DC
  Administered 2012-10-11 – 2012-10-16 (×10): 25 mg via ORAL
  Filled 2012-10-11 (×12): qty 1

## 2012-10-11 MED ORDER — PROMETHAZINE HCL 25 MG/ML IJ SOLN
12.5000 mg | Freq: Four times a day (QID) | INTRAMUSCULAR | Status: DC | PRN
  Administered 2012-10-11 – 2012-10-12 (×2): 12.5 mg via INTRAVENOUS
  Filled 2012-10-11 (×2): qty 1

## 2012-10-11 MED ORDER — NITROGLYCERIN 0.4 MG SL SUBL
0.4000 mg | SUBLINGUAL_TABLET | SUBLINGUAL | Status: DC | PRN
  Administered 2012-10-11: 0.4 mg via SUBLINGUAL

## 2012-10-11 MED ORDER — POLYVINYL ALCOHOL 1.4 % OP SOLN
1.0000 [drp] | Freq: Three times a day (TID) | OPHTHALMIC | Status: DC
Start: 2012-10-11 — End: 2012-10-16
  Administered 2012-10-11 – 2012-10-16 (×14): 1 [drp] via OPHTHALMIC
  Filled 2012-10-11: qty 15

## 2012-10-11 MED ORDER — ALUM & MAG HYDROXIDE-SIMETH 200-200-20 MG/5ML PO SUSP: 30.0000 mL | Freq: Four times a day (QID) | ORAL | Status: DC | PRN

## 2012-10-11 MED ORDER — VENLAFAXINE HCL ER 150 MG PO CP24
150.0000 mg | ORAL_CAPSULE | Freq: Every day | ORAL | Status: DC
  Administered 2012-10-12 – 2012-10-16 (×5): 150 mg via ORAL
  Filled 2012-10-11 (×6): qty 1

## 2012-10-11 MED ORDER — DONEPEZIL HCL 10 MG PO TABS
10.0000 mg | ORAL_TABLET | Freq: Every day | ORAL | Status: DC
  Administered 2012-10-11 – 2012-10-15 (×5): 10 mg via ORAL
  Filled 2012-10-11 (×6): qty 1

## 2012-10-11 NOTE — ED Notes (Signed)
From [facility] via [facility]. Reports CP with N/V around 2300 last night. Was assessed by staff; given scheduled meds as well as Nitro. Pain resolved. MD told staff to send to ED if CP was to occur again. Around 5:30 this AM, patient nonradiating, midchest pain, rates 6/10, describes as a nagging ache. Initial BP 180/100. Received ASA 324 mg PO. Nitro 0.4 mg x 1. BP 120/64. EKG unremarkable.

## 2012-10-11 NOTE — ED Provider Notes (Signed)
CSN: [id number]     Arrival date & time 10/11/12  0613 History     First MD Initiated Contact with Patient 10/11/12 [phone]     Chief Complaint  Patient presents with  . Chest Pain   (Consider location/radiation/quality/duration/timing/severity/associated sxs/prior Treatment) HPI Comments: Patient with moderate dementia, HTN, hyperlipidemia, diastolic HF, p/w chest pain, N/V that began yesterday.  Per daughter, was not feeling well yesterday evening, c/o CP, decreased appetite.  She has been treated recently for constipation and had a large solid bowel movement while she was there.  Overnight, nursing home states pt had CP, N/V at 2300, resolved with home meds.  She had another episode this morning at 5:30.   Pt currently is c/o left sided chest pain.  Is unable to communicate more than that.  Daughter states pt does not appear more SOB than usual.    Level V caveat for dementia and not communicating.  Pt is alert but does not respond to my questions or her daughter's questions.   Patient is a 79 y.o. female presenting with chest pain. The history is provided by the patient, the nursing home and a relative. The history is limited by the condition of the patient.  Chest Pain   Past Medical History  Diagnosis Date  . Asthma   . Shortness of breath   . Hypertension   . Blood transfusion   . Dementia with behavioral disturbance   . Other malaise and fatigue   . Esophageal reflux   . Essential hypertension, benign   . Unspecified diastolic heart failure   . Personal history of alcoholism   . Hypopotassemia    Past Surgical History  Procedure Laterality Date  . Colonoscopy  03/08/2011    Procedure: COLONOSCOPY;  Surgeon: Erick Blinks, MD;  Location: [facility] ENDOSCOPY;  Service: Gastroenterology;  Laterality: N/A;   No family history on file. History  Substance Use Topics  . Smoking status: Former Smoker -- 1.00 packs/day    Types: Cigarettes  . Smokeless tobacco: Never Used  . Alcohol Use:  No     Comment: former    OB History   Grav Para Term Preterm Abortions TAB SAB Ect Mult Living                 Review of Systems  Unable to perform ROS: Dementia  Cardiovascular: Positive for chest pain.    Allergies  Penicillins  Home Medications   Current Outpatient Rx  Name  Route  Sig  Dispense  Refill  . acetaminophen (TYLENOL) 325 MG tablet   Oral   Take 650 mg by mouth every 8 (eight) hours as needed for pain.         Marland Kitchen acetaminophen (TYLENOL) 500 MG tablet   Oral   Take 1,000 mg by mouth 2 (two) times daily.         Marland Kitchen albuterol (PROVENTIL) (5 MG/ML) 0.5% nebulizer solution   Nebulization   Take 2.5 mg by nebulization every 4 (four) hours as needed. wheezing         . amLODipine (NORVASC) 5 MG tablet   Oral   Take 5 mg by mouth daily.         Marland Kitchen docusate sodium (COLACE) 100 MG capsule   Oral   Take 100 mg by mouth 2 (two) times daily.         Marland Kitchen donepezil (ARICEPT) 10 MG tablet   Oral   Take 10 mg by mouth at bedtime.          Marland Kitchen  Fluticasone-Salmeterol (ADVAIR) 250-50 MCG/DOSE AEPB   Inhalation   Inhale 1 puff into the lungs every 12 (twelve) hours.         . furosemide (LASIX) 20 MG tablet   Oral   Take 10 mg by mouth daily.          Marland Kitchen levothyroxine (SYNTHROID, LEVOTHROID) 100 MCG tablet   Oral   Take 100 mcg by mouth daily before breakfast.         . loratadine (CLARITIN) 10 MG tablet   Oral   Take 10 mg by mouth 2 (two) times daily.         . magnesium hydroxide (MILK OF MAGNESIA) 400 MG/5ML suspension   Oral   Take 30 mLs by mouth daily as needed for constipation.         . Memantine HCl ER (NAMENDA XR) 28 MG CP24   Oral   Take 1 capsule by mouth daily.         . metoprolol tartrate (LOPRESSOR) 25 MG tablet   Oral   Take 25 mg by mouth 2 (two) times daily.         Marland Kitchen morphine (MS CONTIN) 15 MG 12 hr tablet   Oral   Take 15 mg by mouth 2 (two) times daily.         . naproxen sodium (ANAPROX) 220 MG tablet    Oral   Take 220 mg by mouth 2 (two) times daily with a meal.         . oxyCODONE (OXY IR/ROXICODONE) 5 MG immediate release tablet   Oral   Take 2.5 mg by mouth every 4 (four) hours as needed for pain.         Bertram Gala Glycol-Propyl Glycol (SYSTANE) 0.4-0.3 % SOLN   Both Eyes   Place 1 drop into both eyes 3 (three) times daily.         . promethazine (PHENERGAN) 25 MG/ML injection   Intravenous   Inject 25 mg into the vein once.         . risperiDONE (RISPERDAL) 0.25 MG tablet   Oral   Take 0.125 mg by mouth 2 (two) times daily.         . rosuvastatin (CRESTOR) 5 MG tablet   Oral   Take 5 mg by mouth daily.         Marland Kitchen senna-docusate (SENOKOT-S) 8.6-50 MG per tablet   Oral   Take 1 tablet by mouth 2 (two) times daily.         . solifenacin (VESICARE) 5 MG tablet   Oral   Take 10 mg by mouth daily.         Marland Kitchen venlafaxine XR (EFFEXOR-XR) 150 MG 24 hr capsule   Oral   Take 150 mg by mouth daily.         . methocarbamol (ROBAXIN) 500 MG tablet   Oral   Take 500 mg by mouth 2 (two) times daily as needed (for pain).          BP 163/71  Pulse 65  Temp(Src) 97.9 F (36.6 C) (Oral)  Resp 23  SpO2 97% Physical Exam  Nursing note and vitals reviewed. Constitutional: She appears well-developed and well-nourished.  Uncomfortable appearing, groaning.   HENT:  Head: Normocephalic and atraumatic.  Neck: Neck supple.  Cardiovascular: Normal rate and regular rhythm.   Pulmonary/Chest: Effort normal and breath sounds normal. No respiratory distress. She has no wheezes. She has no rales. She  exhibits tenderness.  Left chest wall tenderness  Abdominal: Soft. She exhibits no distension. There is generalized tenderness. There is no rebound and no guarding.  Neurological: She is alert.  Skin: She is diaphoretic.    ED Course   Procedures (including critical care time)  Labs Reviewed  CBC - Abnormal; Notable for the following:    RDW 17.5 (*)    All  other components within normal limits  BASIC METABOLIC PANEL - Abnormal; Notable for the following:    Glucose, Bld 181 (*)    GFR calc non Af Amer 67 (*)    GFR calc Af Amer 77 (*)    All other components within normal limits  PRO B NATRIURETIC PEPTIDE  HEPATIC FUNCTION PANEL  POCT I-STAT TROPONIN I   Dg Chest Port 1 View  10/11/2012   *RADIOLOGY REPORT*  Clinical Data: Chest pain  PORTABLE CHEST - 1 VIEW  Comparison: Prior radiograph from 04/12/2011  Findings: The cardiac and mediastinal silhouettes are stable in size and contour, and remain within normal limits.  The lungs are normally inflated.  A vague opacity overlying the lingula and left lung base is similar as compared to the prior exam, likely represents atelectasis / scarring and prominent mediastinal fat pad.  No definite airspace consolidation identified.  There is mild pulmonary vascular congestion without overt pulmonary edema. Pneumothorax or pulmonary edema.  Nodular opacity overlying the lower left chest wall likely represents the nipple in profile as it is surrounded by gas lucency.  No acute osseous abnormalities identified.  IMPRESSION: Hypoinflation with mild pulmonary vascular congestion.   Original Report Authenticated By: Rise Mu, M.D.   8:08 AM Discussed pt w Dr Radford Pax who will also see the patient.  8:18 AM Dr Radford Pax has also seen the patient, has ordered nitroglycerin for patient.  Plan is for admission for chest pain.    Date: 10/11/2012  Rate: 67  Rhythm: normal sinus rhythm  QRS Axis: left  Intervals: normal  ST/T Wave abnormalities: ST depressions laterally  Conduction Disutrbances:none  Narrative Interpretation:   Old EKG Reviewed: unchanged, ST depressions may be slightly worse laterally since Feb 2013   9:27 AM Pt reports her pain is improved, reports continued nausea. Admitted to [facility].   1. Chest pain   2. Abdominal pain, unspecified site   3. Alzheimer's dementia   4. Dehydration   5.  HTN (hypertension)   6. Nausea and vomiting   7. Other and unspecified hyperlipidemia   8. Unspecified constipation     MDM  Elderly patient with dementia and left sided chest pain, diaphoresis, N/V.  Discussed pt with Dr Radford Pax who also saw and examined patient.  Considered aortic dissection, Dr Radford Pax is not suspicious of this given his exam of the patient.  Doubt PE.  EKG showing same pattern as prior EKG, possible slight worsening in lateral leads.  Troponin negative.  Other labs unremarkable except for mild hyperglycemia.  CXR shows mild pulmonary vascular congestion on CXR.  Pt admitted for chest pain.        Trixie Dredge, PA-C 10/11/12 1253

## 2012-10-11 NOTE — H&P (Signed)
[facility] History and Physical  [name] NFA:[id number] DOB: 05-Nov-1929 DOA: 10/11/2012  Referring physician: EDP PCP: [name] Aliment, MD   Chief Complaint: nausea  HPI: [name] is a 79 y.o. female, demented from ALF who has had vomiting, abdominal pain, chest pain since yesterday.  Pt unable to provide much history. Daughter provides most.  No known F/C. No Diarrhea. Mainly severe constipation. No dyspnea.  Chest pain gone, but remains nauseated. Last stool yesterday.  EKG and troponin ok  Review of Systems:  Difficult due to dementia.  Past Medical History  Diagnosis Date  . Asthma   . Shortness of breath   . Hypertension   . Blood transfusion   . Dementia with behavioral disturbance   . Other malaise and fatigue   . Esophageal reflux   . Essential hypertension, benign   . Unspecified diastolic heart failure   . Personal history of alcoholism   . Hypopotassemia    Past Surgical History  Procedure Laterality Date  . Colonoscopy  03/08/2011    Procedure: COLONOSCOPY;  Surgeon: [name] Blinks, MD;  Location: [facility] ENDOSCOPY;  Service: Gastroenterology;  Laterality: N/A;   Social History:  reports that she has quit smoking. Her smoking use included Cigarettes. She smoked 1.00 pack per day. She has never used smokeless tobacco. She reports that she does not drink alcohol or use illicit drugs. Per daughter, DNR  Allergies  Allergen Reactions  . Penicillins Other (See Comments)    unknown   Family history: unable due to patient factors  Prior to Admission medications   Medication Sig Start Date End Date Taking? Authorizing Provider  acetaminophen (TYLENOL) 325 MG tablet Take 650 mg by mouth every 8 (eight) hours as needed for pain.   Yes Historical Provider, MD  acetaminophen (TYLENOL) 500 MG tablet Take 1,000 mg by mouth 2 (two) times daily.   Yes Historical Provider, MD  albuterol (PROVENTIL) (5 MG/ML) 0.5% nebulizer solution Take 2.5 mg by nebulization  every 4 (four) hours as needed. wheezing 03/15/11 10/11/12 Yes Leroy Sea, MD  amLODipine (NORVASC) 5 MG tablet Take 5 mg by mouth daily.   Yes Historical Provider, MD  docusate sodium (COLACE) 100 MG capsule Take 100 mg by mouth 2 (two) times daily.   Yes Historical Provider, MD  donepezil (ARICEPT) 10 MG tablet Take 10 mg by mouth at bedtime.    Yes Historical Provider, MD  Fluticasone-Salmeterol (ADVAIR) 250-50 MCG/DOSE AEPB Inhale 1 puff into the lungs every 12 (twelve) hours.   Yes Historical Provider, MD  furosemide (LASIX) 20 MG tablet Take 10 mg by mouth daily.  03/15/11 10/11/12 Yes Leroy Sea, MD  levothyroxine (SYNTHROID, LEVOTHROID) 100 MCG tablet Take 100 mcg by mouth daily before breakfast.   Yes Historical Provider, MD  loratadine (CLARITIN) 10 MG tablet Take 10 mg by mouth 2 (two) times daily.   Yes Historical Provider, MD  magnesium hydroxide (MILK OF MAGNESIA) 400 MG/5ML suspension Take 30 mLs by mouth daily as needed for constipation.   Yes Historical Provider, MD  Memantine HCl ER (NAMENDA XR) 28 MG CP24 Take 1 capsule by mouth daily.   Yes Historical Provider, MD  metoprolol tartrate (LOPRESSOR) 25 MG tablet Take 25 mg by mouth 2 (two) times daily. 03/15/11 10/11/12 Yes Leroy Sea, MD  morphine (MS CONTIN) 15 MG 12 hr tablet Take 15 mg by mouth 2 (two) times daily.   Yes Historical Provider, MD  naproxen sodium (ANAPROX) 220 MG tablet Take  220 mg by mouth 2 (two) times daily with a meal.   Yes Historical Provider, MD  oxyCODONE (OXY IR/ROXICODONE) 5 MG immediate release tablet Take 2.5 mg by mouth every 4 (four) hours as needed for pain.   Yes Historical Provider, MD  Polyethyl Glycol-Propyl Glycol (SYSTANE) 0.4-0.3 % SOLN Place 1 drop into both eyes 3 (three) times daily.   Yes Historical Provider, MD  promethazine (PHENERGAN) 25 MG/ML injection Inject 25 mg into the vein once.   Yes Historical Provider, MD  risperiDONE (RISPERDAL) 0.25 MG tablet Take 0.125 mg by mouth 2  (two) times daily.   Yes Historical Provider, MD  rosuvastatin (CRESTOR) 5 MG tablet Take 5 mg by mouth daily.   Yes Historical Provider, MD  senna-docusate (SENOKOT-S) 8.6-50 MG per tablet Take 1 tablet by mouth 2 (two) times daily.   Yes Historical Provider, MD  solifenacin (VESICARE) 5 MG tablet Take 10 mg by mouth daily.   Yes Historical Provider, MD  venlafaxine XR (EFFEXOR-XR) 150 MG 24 hr capsule Take 150 mg by mouth daily.   Yes Historical Provider, MD  methocarbamol (ROBAXIN) 500 MG tablet Take 500 mg by mouth 2 (two) times daily as needed (for pain).    Historical Provider, MD   Physical Exam: Filed Vitals:   10/11/12 1139  BP: 135/78  Pulse: 62  Temp: 98.6 F (37 C)  Resp: 18   BP 135/78  Pulse 62  Temp(Src) 98.6 F (37 C) (Oral)  Resp 18  Ht 5\' 5"  (1.651 m)  Wt 94.6 kg (208 lb 8.9 oz)  BMI 34.71 kg/m2  SpO2 95%  General Appearance:    Alert, cooperative, uncomfortable, moaning. obese  Head:    Normocephalic, without obvious abnormality, atraumatic  Eyes:    PERRL, conjunctiva/corneas clear, EOM's intact, fundi    benign, both eyes  Ears:    Normal TM's and external ear canals, both ears  Nose:   Nares normal, septum midline, mucosa normal, no drainage    or sinus tenderness  Throat:   Lips, mucosa, and tongue normal; teeth and gums normal  Neck:   Supple, symmetrical, trachea midline, no adenopathy;    thyroid:  no enlargement/tenderness/nodules; no carotid   bruit or JVD  Back:     Symmetric, no curvature, ROM normal, no CVA tenderness  Lungs:     Clear to auscultation bilaterally, respirations unlabored  Chest Wall:    No tenderness or deformity   Heart:    Regular rate and rhythm, S1 and S2 normal, no murmur, rub   or gallop     Abdomen:     Soft, obese, normal bowel sounds. nontender  Genitalia:    deferred  Rectal:    Deferred  Extremities:   Extremities normal, atraumatic, no cyanosis or edema  Pulses:   2+ and symmetric all extremities  Skin:   Skin  color, texture, turgor normal, no rashes or lesions  Lymph nodes:   Cervical, supraclavicular, and axillary nodes normal  Neurologic:   CNII-XII intact, normal strength, sensation and reflexes    throughout    Psych:   cooperative  Labs on Admission:  Basic Metabolic Panel:  Recent Labs Lab 10/11/12 0650  NA 140  K 3.8  CL 103  CO2 25  GLUCOSE 181*  BUN 14  CREATININE 0.81  CALCIUM 10.0   Liver Function Tests:  Recent Labs Lab 10/11/12 0758  AST 16  ALT 9  ALKPHOS 81  BILITOT 0.4  PROT 7.9  ALBUMIN  3.4*    Recent Labs Lab 10/11/12 0758  LIPASE 16   No results found for this basename: AMMONIA,  in the last 168 hours CBC:  Recent Labs Lab 10/11/12 0650  WBC 9.1  HGB 13.8  HCT 41.7  MCV 83.4  PLT 326   Cardiac Enzymes: No results found for this basename: CKTOTAL, CKMB, CKMBINDEX, TROPONINI,  in the last 168 hours  BNP (last 3 results)  Recent Labs  10/11/12 0758  PROBNP 357.3   CBG: No results found for this basename: GLUCAP,  in the last 168 hours  Radiological Exams on Admission: Dg Chest Port 1 View  10/11/2012   *RADIOLOGY REPORT*  Clinical Data: Chest pain  PORTABLE CHEST - 1 VIEW  Comparison: Prior radiograph from 04/12/2011  Findings: The cardiac and mediastinal silhouettes are stable in size and contour, and remain within normal limits.  The lungs are normally inflated.  A vague opacity overlying the lingula and left lung base is similar as compared to the prior exam, likely represents atelectasis / scarring and prominent mediastinal fat pad.  No definite airspace consolidation identified.  There is mild pulmonary vascular congestion without overt pulmonary edema. Pneumothorax or pulmonary edema.  Nodular opacity overlying the lower left chest wall likely represents the nipple in profile as it is surrounded by gas lucency.  No acute osseous abnormalities identified.  IMPRESSION: Hypoinflation with mild pulmonary vascular congestion.   Original  Report Authenticated By: Rise Mu, M.D.   EKG: Sinus rhythm Consider left atrial enlargement Borderline left axis deviation Borderline repolarization abnormality  Assessment/Plan Principal Problem:   Chest pain Active Problems:   Nausea and vomiting   Abdominal pain, unspecified site   Unspecified constipation   Dehydration   Diastolic dysfunction   GERD   HTN (hypertension)   Morbid obesity   Alzheimer's dementia   Aortic stenosis, moderate   Asthma, chronic   Other and unspecified hyperlipidemia  R/o MI. Check D dimer. If positive CTA chest. Check abdominal 2 view xray. Antiemetics. Clears for now.  Code Status: DNR Family Communication: daughter at bedside Disposition Plan: ALF  Time spent: 60 min  Christiane Ha [facility] Pager [phone]  If 7PM-7AM, please contact night-coverage [email or web address] Password [facility] 10/11/2012, 12:15 PM

## 2012-10-11 NOTE — ED Notes (Signed)
Portable CXR at bedside.

## 2012-10-11 NOTE — ED Notes (Signed)
Family at bedside. 

## 2012-10-12 ENCOUNTER — Observation Stay (HOSPITAL_COMMUNITY): Payer: PRIVATE HEALTH INSURANCE

## 2012-10-12 DIAGNOSIS — R911 Solitary pulmonary nodule: Secondary | ICD-10-CM

## 2012-10-12 DIAGNOSIS — K219 Gastro-esophageal reflux disease without esophagitis: Principal | ICD-10-CM

## 2012-10-12 DIAGNOSIS — I359 Nonrheumatic aortic valve disorder, unspecified: Secondary | ICD-10-CM

## 2012-10-12 HISTORY — DX: Solitary pulmonary nodule: R91.1

## 2012-10-12 MED ORDER — MAGNESIUM HYDROXIDE 400 MG/5ML PO SUSP
30.0000 mL | Freq: Every day | ORAL | Status: DC
  Administered 2012-10-12: 30 mL via ORAL
  Filled 2012-10-12: qty 30

## 2012-10-12 MED ORDER — PANTOPRAZOLE SODIUM 40 MG PO TBEC
40.0000 mg | DELAYED_RELEASE_TABLET | Freq: Two times a day (BID) | ORAL | Status: DC
  Administered 2012-10-12 – 2012-10-16 (×8): 40 mg via ORAL
  Filled 2012-10-12 (×7): qty 1

## 2012-10-12 MED ORDER — SENNOSIDES-DOCUSATE SODIUM 8.6-50 MG PO TABS
1.0000 | ORAL_TABLET | Freq: Two times a day (BID) | ORAL | Status: DC
  Administered 2012-10-12: 1 via ORAL
  Filled 2012-10-12 (×2): qty 1

## 2012-10-12 MED ORDER — SENNOSIDES-DOCUSATE SODIUM 8.6-50 MG PO TABS
1.0000 | ORAL_TABLET | Freq: Every day | ORAL | Status: DC | PRN
  Administered 2012-10-12: 1 via ORAL
  Filled 2012-10-12 (×2): qty 1

## 2012-10-12 MED ORDER — BISACODYL 10 MG RE SUPP: 10.0000 mg | Freq: Every day | RECTAL | Status: DC | PRN

## 2012-10-12 MED ORDER — IOHEXOL 350 MG/ML SOLN
100.0000 mL | Freq: Once | INTRAVENOUS | Status: AC | PRN
  Administered 2012-10-12: 100 mL via INTRAVENOUS

## 2012-10-12 MED ORDER — POLYETHYLENE GLYCOL 3350 17 G PO PACK
17.0000 g | PACK | Freq: Every day | ORAL | Status: DC
  Administered 2012-10-13 – 2012-10-16 (×4): 17 g via ORAL
  Filled 2012-10-12 (×4): qty 1

## 2012-10-12 MED ORDER — LORAZEPAM 0.5 MG PO TABS
0.2500 mg | ORAL_TABLET | Freq: Once | ORAL | Status: AC | PRN
  Administered 2012-10-12: 0.25 mg via ORAL
  Filled 2012-10-12: qty 1

## 2012-10-12 MED ORDER — MAGNESIUM HYDROXIDE 400 MG/5ML PO SUSP: 30.0000 mL | Freq: Every day | ORAL | Status: DC | PRN

## 2012-10-12 NOTE — Progress Notes (Signed)
Pt was given enema, suppository, and oral laxative.  Pt had large BM, hard/ then formed.  Pt states she is feeling much better now, and that the nausea is gone.

## 2012-10-12 NOTE — Progress Notes (Signed)
Pt refused lab draw, "Leave me alone." Discussed the reason for the lab work with the patient.  Patient said she will think about it later.  Denies complaints, VS remain stable, NAD, will continue to monitor.

## 2012-10-12 NOTE — Progress Notes (Signed)
Echo Lab  2D Echocardiogram completed.  [name] L Jaimen Melone, RDCS 10/12/2012 10:24 AM

## 2012-10-12 NOTE — ED Provider Notes (Signed)
Medical screening examination/treatment/procedure(s) were performed by non-physician practitioner and as supervising physician I was immediately available for consultation/collaboration.    Troy Kanouse L Trellis Vanoverbeke, MD 10/12/12 0815 

## 2012-10-12 NOTE — Progress Notes (Signed)
Pt refused CT scan attempting to hit staff yelling "I'm not going anywhere tonight, I'll do it in the morning."  Attempted to educate pt on CT scan, pt adamant about not doing it tonight.  CT rescheduled for the morning.  Denies complaints, will continue to monitor.

## 2012-10-12 NOTE — Progress Notes (Signed)
Clinical Social Work Department BRIEF PSYCHOSOCIAL ASSESSMENT 10/12/2012  Patient:  [name], [name]     Account Number:  [id number]     Admit date:  10/11/2012  Clinical Social Worker:  Carren Rang  Date/Time:  10/12/2012 12:00 M  Referred by:  Care Management  Date Referred:  10/11/2012 Referred for  SNF Placement   Other Referral:   Interview type:  Family Other interview type:    PSYCHOSOCIAL DATA Living Status:  FACILITY Admitted from facility:  [facility] Level of care:  Assisted Living Primary support name:  [name] Primary support relationship to patient:  CHILD, ADULT Degree of support available:   Good    CURRENT CONCERNS Current Concerns  Post-Acute Placement   Other Concerns:    SOCIAL WORK ASSESSMENT / PLAN Clinical Social Worker received referral for ALF placement at d/c. Patient is from [facility] which is actually a SNF, which was confirmed by admissions. CSW spoke with daughter [name] who states that she wants patient to return back to [place]. CSW called Lacinda Axon and confirmed it was okay to return back after medically ready. CSW will update patient, family and facility when d/c is ready.   Assessment/plan status:  Psychosocial Support/Ongoing Assessment of Needs Other assessment/ plan:   Information/referral to community resources:   SNF information    PATIENT'S/FAMILY'S RESPONSE TO PLAN OF CARE: Patient's daughter stated it was okay for the patient to return back to SNF, [place]. Patient was not in the room when CSW tried to visit, CSW called daughter. [name] stated she will be at the Hospital this afternoon and plans on speaking to CSW more then.       Maree Krabbe, MSW, Theresia Majors [phone]

## 2012-10-12 NOTE — Progress Notes (Signed)
[facility] PROGRESS NOTE  [name] UJW:[id number] DOB: June 09, 1929 DOA: 10/11/2012 PCP: Gwynneth Aliment, MD  Assessment/Plan: Active Problems:   Chest pain: MI ruled out. CT angiogram shows no pulmonary embolus. Does show lung nodule which will need to be followed up as an outpatient if family interested in being aggressive    Nausea and vomiting clinically seems better today, but has not taken much in the way of clears. Will advance diet and monitor.    Unspecified constipation appears quite severe. Patient is currently on the bedpan after suppository may need enema. Will give milk of magnesia Senokot and continue daily Dulcolax suppositories. Needs to get out of bed. Nurses report that she is very weak. At baseline it sounds as if she is wheelchair-bound. Will get a physical therapy evaluation    Abdominal pain, unspecified site, seems better today. Likely due to above.     Dehydration: Improved. Continue IV fluids until taking anything reliably by mouth.    Diastolic dysfunction, compensated without evidence of CHF. Lasix held    GERD: Change protonic so by mouth.    HTN (hypertension): Continue antihypertensives    Morbid obesity    Alzheimer's dementia: Per nursing staff, had some agitation last night. Currently cooperative.    Aortic stenosis, severe: I doubt patient would be a candidate for intervention due to her advanced dementia, poor functional status and multiple other medical issues. Can followup with cardiology as an outpatient    Asthma, chronic stable    Other and unspecified hyperlipidemia    Lung nodule: Will discuss with family. If they would like to be aggressive, would get an outpatient PET scan versus repeat CT of the chest in 3 months.  debility see above  Code Status: DNR Family Communication: none today Disposition Plan: ALF   Consultants:    Procedures:    Antibiotics:    HPI/Subjective: Per nurse, taking medications without  any difficulty. Has not taken much in the way of clears. No bowel movement yet. No family in the room. denies chest pain.  Objective: Filed Vitals:   10/12/12 0311  BP: 160/84  Pulse: 61  Temp: 98.7 F (37.1 C)  Resp: 16   No intake or output data in the 24 hours ending 10/12/12 1125 Filed Weights   10/11/12 1139 10/12/12 0311  Weight: 94.6 kg (208 lb 8.9 oz) 93.3 kg (205 lb 11 oz)    Exam:   General:  Appears more comfortable. Currently on bedpan. Confused. No longer moaning  Cardiovascular: Regular rate rhythm without murmurs gallops rubs  Respiratory: Clear to auscultation bilaterally without wheeze rhonchi or rale  Abdomen: obese, soft, nontender  Ext:  No CCE  Data Reviewed: Basic Metabolic Panel:  Recent Labs Lab 10/11/12 0650 10/11/12 1625  NA 140  --   K 3.8  --   CL 103  --   CO2 25  --   GLUCOSE 181*  --   BUN 14  --   CREATININE 0.81 0.79  CALCIUM 10.0  --    Liver Function Tests:  Recent Labs Lab 10/11/12 0758  AST 16  ALT 9  ALKPHOS 81  BILITOT 0.4  PROT 7.9  ALBUMIN 3.4*    Recent Labs Lab 10/11/12 0758  LIPASE 16   No results found for this basename: AMMONIA,  in the last 168 hours CBC:  Recent Labs Lab 10/11/12 0650 10/11/12 1625  WBC 9.1 10.4  HGB 13.8 14.6  HCT 41.7 42.1  MCV 83.4 82.7  PLT 326 324   Cardiac Enzymes:  Recent Labs Lab 10/11/12 1625 10/11/12 2252 10/12/12 0548  TROPONINI <0.30 <0.30 <0.30   BNP (last 3 results)  Recent Labs  10/11/12 0758  PROBNP 357.3   CBG: No results found for this basename: GLUCAP,  in the last 168 hours  No results found for this or any previous visit (from the past 240 hour(s)).   Studies: Ct Angio Chest Pe W/cm &/or Wo Cm  10/12/2012   *RADIOLOGY REPORT*  Clinical Data: Chest pain  CT ANGIOGRAPHY CHEST  Technique:  Multidetector CT imaging of the chest using the standard protocol during bolus administration of intravenous contrast. Multiplanar reconstructed  images including MIPs were obtained and reviewed to evaluate the vascular anatomy.  Contrast: OMNIPAQUE IOHEXOL 350 MG/ML SOLN  Comparison: Chest radiograph October 11, 2012 and CT angiogram chest March 12, 2011.  Findings: There is no demonstrable pulmonary embolus.  There is no thoracic aortic aneurysm or dissection. There is stable atherosclerotic change in the aorta with peripheral thrombus in the descending aorta which is stable.  There is no appreciable thoracic adenopathy.  The pericardium is not thickened.  There is a new 9 x 9 mm nodular opacity in the medial segment of the right middle lobe, seen on slice 42.  There is a stable 5 mm nodular opacity in the medial segment of the right middle lobe base, seen on slice 40. There is mild subsegmental atelectasis in both lower lobes.  There is a small hiatal hernia.  In the visualized upper abdomen, there is atherosclerotic change in the aorta and fatty change in the liver.  Visualized upper abdominal structures otherwise appear normal.  There is degenerative change in the thoracic spine.  There are no blastic or lytic bone lesions.  Visualized portions of thyroid appear unremarkable.  IMPRESSION: There is a new 9 x 9 mm nodular lesion in the right middle lobe. This finding warrants further evaluation.  Given this finding, it may be reasonable to correlate with nuclear medicine PET / CT examination to further evaluate.  At a minimum, a follow-up chest CT in 3 months to assess for stability would be advisable.  There is a stable 5 mm nodular lesion in the right middle lobe as well.  There is no demonstrable pulmonary embolus.  There is no airspace consolidation or edema.  There is no demonstrable pulmonary embolus.  There is stable atherosclerotic change in the aorta.  There is a small hiatal hernia.  There is fatty change in the liver.   Original Report Authenticated By: Bretta Bang, M.D.   Dg Chest Port 1 View  10/11/2012   *RADIOLOGY REPORT*   Clinical Data: Chest pain  PORTABLE CHEST - 1 VIEW  Comparison: Prior radiograph from 04/12/2011  Findings: The cardiac and mediastinal silhouettes are stable in size and contour, and remain within normal limits.  The lungs are normally inflated.  A vague opacity overlying the lingula and left lung base is similar as compared to the prior exam, likely represents atelectasis / scarring and prominent mediastinal fat pad.  No definite airspace consolidation identified.  There is mild pulmonary vascular congestion without overt pulmonary edema. Pneumothorax or pulmonary edema.  Nodular opacity overlying the lower left chest wall likely represents the nipple in profile as it is surrounded by gas lucency.  No acute osseous abnormalities identified.  IMPRESSION: Hypoinflation with mild pulmonary vascular congestion.   Original Report Authenticated By: Rise Mu, M.D.   Dg Abd 2 Views  10/11/2012   *RADIOLOGY REPORT*  Clinical Data: Abdominal pain, nausea  ABDOMEN - 2 VIEW  Comparison: CT abdomen pelvis dated 03/06/2011  Findings: Nonobstructive bowel gas pattern.  No evidence of free air on the lateral decubitus view.  Moderate colonic stool burden.  Moderate degenerative changes of the right hip.  Mild degenerative changes of the left hip.  IMPRESSION: No evidence of small bowel obstruction or free air.  Moderate colonic stool burden, raising the possibility of constipation.   Original Report Authenticated By: Charline Bills, M.D.    Scheduled Meds: . amLODipine  5 mg Oral Daily  . bisacodyl  10 mg Rectal Daily  . darifenacin  7.5 mg Oral Daily  . donepezil  10 mg Oral QHS  . heparin  5,000 Units Subcutaneous Q8H  . levothyroxine  100 mcg Oral QAC breakfast  . Memantine HCl ER  1 capsule Oral Daily  . metoprolol tartrate  25 mg Oral BID  . mometasone-formoterol  2 puff Inhalation BID  . pantoprazole (PROTONIX) IV  40 mg Intravenous Q12H  . polyvinyl alcohol  1 drop Both Eyes TID  . risperiDONE   0.125 mg Oral QHS  . venlafaxine XR  150 mg Oral Daily   Continuous Infusions: . 0.9 % NaCl with KCl 20 mEq / L 75 mL/hr at 10/12/12 0304    Time spent: 40 minutes  Hendrix Console L  [facility] Pager [phone]. If 7PM-7AM, please contact night-coverage at [email or web address], password [facility] 10/12/2012, 11:25 AM  LOS: 1 day

## 2012-10-13 DIAGNOSIS — G4700 Insomnia, unspecified: Secondary | ICD-10-CM | POA: Insufficient documentation

## 2012-10-13 HISTORY — DX: Insomnia, unspecified: G47.00

## 2012-10-13 MED ORDER — ENSURE COMPLETE PO LIQD
237.0000 mL | Freq: Three times a day (TID) | ORAL | Status: DC
  Administered 2012-10-13 – 2012-10-16 (×4): 237 mL via ORAL

## 2012-10-13 MED ORDER — OXYCODONE HCL 5 MG PO TABS: 2.5000 mg | ORAL_TABLET | ORAL | Status: DC | PRN

## 2012-10-13 MED ORDER — TRAZODONE HCL 50 MG PO TABS
50.0000 mg | ORAL_TABLET | Freq: Every day | ORAL | Status: DC
  Administered 2012-10-13: 50 mg via ORAL
  Filled 2012-10-13 (×3): qty 1

## 2012-10-13 NOTE — Progress Notes (Addendum)
CSW spoke to [facility] admissions, who stated that they are able to take patients on Saturdays and the dc summary needs to be faxed to the nurses station if dc on Saturday. Will pass on to the weekend CSW if dc is ready for weekend. Updated patient and son with this information.  Maree Krabbe, MSW, Theresia Majors [phone]

## 2012-10-13 NOTE — Care Management Note (Unsigned)
    Page 1 of 1   10/13/2012     4:18:33 PM   CARE MANAGEMENT NOTE 10/13/2012  Patient:  [name],[name]   Account Number:  [id number]  Date Initiated:  10/13/2012  Documentation initiated by:  Tanyon Alipio  Subjective/Objective Assessment:   PT ADM ON 10/11/12 WITH N/V AND CHEST PAIN.  PTA, PT RESIDED AT [facility]ILL[facility]AC[facility].     Action/Plan:   WILL CONSULT CSW TO FACILITATE RETURN TO SNF WHEN MEDICALLY STABLE.   Anticipated DC Date:  10/14/2012   Anticipated DC Plan:  SKILLED NURSING FACILITY  In-house referral  Clinical Social Worker      DC Planning Services  CM consult      Choice offered to / List presented to:             Status of service:  In process, will continue to follow Medicare Important Message given?   (If response is "NO", the following Medicare IM given date fields will be blank) Date Medicare IM given:   Date Additional Medicare IM given:    Discharge Disposition:    Per UR Regulation:  Reviewed for med. necessity/level of care/duration of stay  If discussed at Long Length of Stay Meetings, dates discussed:    Comments:

## 2012-10-13 NOTE — Progress Notes (Signed)
[facility] PROGRESS NOTE  [name] MVH:[id number] DOB: 01-24-1930 DOA: 10/11/2012 PCP: Gwynneth Aliment, MD  Assessment/Plan:  Received trazadone last night. Now very sedated.  Will hold all sedating meds. Not stable for discharge.  Still not eating much  Code Status: DNR Family Communication: none today Disposition Plan: ALF   Consultants:    Procedures:    Antibiotics:    HPI/Subjective: unable  Objective: Filed Vitals:   10/13/12 1114  BP: 166/89  Pulse: 65  Temp:   Resp:     Intake/Output Summary (Last 24 hours) at 10/13/12 1151 Last data filed at 10/12/12 1741  Gross per 24 hour  Intake    120 ml  Output      0 ml  Net    120 ml   Filed Weights   10/11/12 1139 10/12/12 0311 10/13/12 0425  Weight: 94.6 kg (208 lb 8.9 oz) 93.3 kg (205 lb 11 oz) 94.4 kg (208 lb 1.8 oz)    Exam:   General:  Drowsy. Only briefly arousable  Cardiovascular: Regular rate rhythm without murmurs gallops rubs  Respiratory: Clear to auscultation bilaterally without wheeze rhonchi or rale  Abdomen: obese, soft, nontender  Ext:  No CCE  Data Reviewed: Basic Metabolic Panel:  Recent Labs Lab 10/11/12 0650 10/11/12 1625  NA 140  --   K 3.8  --   CL 103  --   CO2 25  --   GLUCOSE 181*  --   BUN 14  --   CREATININE 0.81 0.79  CALCIUM 10.0  --    Liver Function Tests:  Recent Labs Lab 10/11/12 0758  AST 16  ALT 9  ALKPHOS 81  BILITOT 0.4  PROT 7.9  ALBUMIN 3.4*    Recent Labs Lab 10/11/12 0758  LIPASE 16   No results found for this basename: AMMONIA,  in the last 168 hours CBC:  Recent Labs Lab 10/11/12 0650 10/11/12 1625  WBC 9.1 10.4  HGB 13.8 14.6  HCT 41.7 42.1  MCV 83.4 82.7  PLT 326 324   Cardiac Enzymes:  Recent Labs Lab 10/11/12 1625 10/11/12 2252 10/12/12 0548  TROPONINI <0.30 <0.30 <0.30   BNP (last 3 results)  Recent Labs  10/11/12 0758  PROBNP 357.3   CBG: No results found for this basename:  GLUCAP,  in the last 168 hours  No results found for this or any previous visit (from the past 240 hour(s)).   Studies: Ct Angio Chest Pe W/cm &/or Wo Cm  10/12/2012   *RADIOLOGY REPORT*  Clinical Data: Chest pain  CT ANGIOGRAPHY CHEST  Technique:  Multidetector CT imaging of the chest using the standard protocol during bolus administration of intravenous contrast. Multiplanar reconstructed images including MIPs were obtained and reviewed to evaluate the vascular anatomy.  Contrast: OMNIPAQUE IOHEXOL 350 MG/ML SOLN  Comparison: Chest radiograph October 11, 2012 and CT angiogram chest March 12, 2011.  Findings: There is no demonstrable pulmonary embolus.  There is no thoracic aortic aneurysm or dissection. There is stable atherosclerotic change in the aorta with peripheral thrombus in the descending aorta which is stable.  There is no appreciable thoracic adenopathy.  The pericardium is not thickened.  There is a new 9 x 9 mm nodular opacity in the medial segment of the right middle lobe, seen on slice 42.  There is a stable 5 mm nodular opacity in the medial segment of the right middle lobe base, seen on slice 40. There is mild subsegmental atelectasis in  both lower lobes.  There is a small hiatal hernia.  In the visualized upper abdomen, there is atherosclerotic change in the aorta and fatty change in the liver.  Visualized upper abdominal structures otherwise appear normal.  There is degenerative change in the thoracic spine.  There are no blastic or lytic bone lesions.  Visualized portions of thyroid appear unremarkable.  IMPRESSION: There is a new 9 x 9 mm nodular lesion in the right middle lobe. This finding warrants further evaluation.  Given this finding, it may be reasonable to correlate with nuclear medicine PET / CT examination to further evaluate.  At a minimum, a follow-up chest CT in 3 months to assess for stability would be advisable.  There is a stable 5 mm nodular lesion in the right middle  lobe as well.  There is no demonstrable pulmonary embolus.  There is no airspace consolidation or edema.  There is no demonstrable pulmonary embolus.  There is stable atherosclerotic change in the aorta.  There is a small hiatal hernia.  There is fatty change in the liver.   Original Report Authenticated By: Bretta Bang, M.D.   Dg Abd 2 Views  10/11/2012   *RADIOLOGY REPORT*  Clinical Data: Abdominal pain, nausea  ABDOMEN - 2 VIEW  Comparison: CT abdomen pelvis dated 03/06/2011  Findings: Nonobstructive bowel gas pattern.  No evidence of free air on the lateral decubitus view.  Moderate colonic stool burden.  Moderate degenerative changes of the right hip.  Mild degenerative changes of the left hip.  IMPRESSION: No evidence of small bowel obstruction or free air.  Moderate colonic stool burden, raising the possibility of constipation.   Original Report Authenticated By: Charline Bills, M.D.    Scheduled Meds: . amLODipine  5 mg Oral Daily  . darifenacin  7.5 mg Oral Daily  . donepezil  10 mg Oral QHS  . feeding supplement  237 mL Oral TID WC  . heparin  5,000 Units Subcutaneous Q8H  . levothyroxine  100 mcg Oral QAC breakfast  . Memantine HCl ER  1 capsule Oral Daily  . metoprolol tartrate  25 mg Oral BID  . mometasone-formoterol  2 puff Inhalation BID  . pantoprazole  40 mg Oral BID  . polyethylene glycol  17 g Oral Daily  . polyvinyl alcohol  1 drop Both Eyes TID  . risperiDONE  0.125 mg Oral QHS  . venlafaxine XR  150 mg Oral Daily   Continuous Infusions:    Time spent: 20 minutes  Annabell Oconnor L  [facility] Pager [phone]. If 7PM-7AM, please contact night-coverage at [email or web address], password [facility] 10/13/2012, 11:51 AM  LOS: 2 days

## 2012-10-13 NOTE — Clinical Documentation Improvement (Signed)
THIS DOCUMENT IS NOT A PERMANENT PART OF THE MEDICAL RECORD  Please update your documentation with the medical record to reflect your response to this query. If you need help knowing how to do this please call [phone].           10/13/12  Dear Dr. Lendell Caprice Marton Redwood  In an effort to better capture your patient's severity of illness, reflect appropriate length of stay and utilization of resources, a review of the patient medical record has revealed the following indicators.    Based on your clinical judgment, please clarify and document in a progress note and/or discharge summary the clinical condition associated with the following supporting information:    Based on your clinical judgment, please clarify cause of Chest Pain:  "             Unstable Angina  "             Pleuritic Chest Pain "             GERD "             Costochondritis "             Dressler's syndrome "             Chest wall pain "             Other Condition "             Cannot Clinically Determine    Risk Factors: Patient with CP- MI ruled out; gerd- change protonix to po route noted per 8/07 progress notes.   You may use possible, probable, or suspect with inpatient documentation. possible, probable, suspected diagnoses MUST be documented at the time of discharge  Reviewed:  no additional documentation provided  Thank You,  Marciano Sequin,  Clinical Documentation Specialist: [phone] Health Information Management [place]

## 2012-10-13 NOTE — Evaluation (Signed)
Physical Therapy Evaluation Patient Details Name: [name] MRN: [id number] DOB: Sep 03, 1929 Today's Date: 10/13/2012 Time: 1040-1110 PT Time Calculation (min): 30 min  PT Assessment / Plan / Recommendation History of Present Illness  79 y.o. female with h/o dementia admitted from ALF with vomiting, abdominal pain, dehydration, constipation.   Clinical Impression  *At baseline pt walked short distances with RW and assist at ALF, per her son. Today she walked 8' with RW, distance limited by fatigue. Assist for mobility recommended. Pt c/o dizziness when OOB. VSS. **    PT Assessment  Patient needs continued PT services    Follow Up Recommendations  SNF;Supervision for mobility/OOB    Does the patient have the potential to tolerate intense rehabilitation      Barriers to Discharge        Equipment Recommendations  None recommended by PT    Recommendations for Other Services     Frequency Min 3X/week    Precautions / Restrictions Precautions Precautions: Fall Restrictions Weight Bearing Restrictions: No   Pertinent Vitals/Pain *BP 168/89 in sitting HR 65 SaO2 96% on RA 0/10 pain**      Mobility  Bed Mobility Bed Mobility: Rolling Right;Right Sidelying to Sit Rolling Right: 3: Mod assist Right Sidelying to Sit: 3: Mod assist Details for Bed Mobility Assistance: assist to elevate trunk Transfers Transfers: Sit to Stand;Stand to Sit Sit to Stand: 4: Min assist;From bed;With upper extremity assist Stand to Sit: 4: Min assist;To chair/3-in-1;With upper extremity assist Details for Transfer Assistance: uncontrolled descent to recliner, assist to rise; VCs for hand placement Ambulation/Gait Ambulation/Gait Assistance: 4: Min assist Ambulation Distance (Feet): 8 Feet Assistive device: Rolling walker Ambulation/Gait Assistance Details: distance limited by fatigue Gait Pattern: Decreased step length - left;Decreased step length - right;Trunk flexed Gait velocity:  decreased    Exercises     PT Diagnosis: Generalized weakness;Difficulty walking  PT Problem List: Decreased activity tolerance;Decreased mobility PT Treatment Interventions: Gait training;Functional mobility training;Therapeutic activities;Therapeutic exercise     PT Goals(Current goals can be found in the care plan section) Acute Rehab PT Goals Patient Stated Goal: to get strength back PT Goal Formulation: With patient Time For Goal Achievement: 10/27/12 Potential to Achieve Goals: Good  Visit Information  Last PT Received On: 10/13/12 Assistance Needed: +1 History of Present Illness: 79 y.o. female with h/o dementia admitted from ALF with vomiting, abdominal pain, dehydration, constipation.        Prior Functioning  Home Living Family/patient expects to be discharged to:: Assisted living Prior Function Level of Independence: Needs assistance Gait / Transfers Assistance Needed: assist to walk short distances with RW ADL's / Homemaking Assistance Needed: assist for bathing and dressing Communication Communication: No difficulties    Cognition  Cognition Arousal/Alertness: Awake/alert Behavior During Therapy: WFL for tasks assessed/performed Overall Cognitive Status: History of cognitive impairments - at baseline Memory: Decreased short-term memory    Extremity/Trunk Assessment Upper Extremity Assessment Upper Extremity Assessment: Overall WFL for tasks assessed Lower Extremity Assessment Lower Extremity Assessment: Overall WFL for tasks assessed Cervical / Trunk Assessment Cervical / Trunk Assessment: Normal   Balance Balance Balance Assessed: Yes Static Sitting Balance Static Sitting - Balance Support: Bilateral upper extremity supported;Feet supported Static Sitting - Level of Assistance: 5: Stand by assistance Static Sitting - Comment/# of Minutes: 4 minutes, SBA 2* pt reported dizziness. VSS.  End of Session PT - End of Session Equipment Utilized During  Treatment: Gait belt Activity Tolerance: Patient limited by fatigue Patient left: in chair;with call  bell/phone within reach Nurse Communication: Mobility status  GP     Ralene Bathe Kistler 10/13/2012, 11:20 AM [phone]

## 2012-10-14 ENCOUNTER — Telehealth: Payer: Self-pay | Admitting: Internal Medicine

## 2012-10-14 DIAGNOSIS — G4700 Insomnia, unspecified: Secondary | ICD-10-CM

## 2012-10-14 NOTE — Progress Notes (Signed)
CSW spoke with RN who said patient is not medically stable for d/c today but possibly tomorrow. CSW will check back tomorrow.  Jetta Lout [phone]

## 2012-10-15 MED ORDER — ONDANSETRON HCL 4 MG PO TABS
4.0000 mg | ORAL_TABLET | Freq: Three times a day (TID) | ORAL | Status: DC
  Administered 2012-10-15 – 2012-10-16 (×4): 4 mg via ORAL
  Filled 2012-10-15 (×6): qty 1

## 2012-10-15 MED ORDER — SENNOSIDES-DOCUSATE SODIUM 8.6-50 MG PO TABS
1.0000 | ORAL_TABLET | Freq: Every day | ORAL | Status: DC
  Administered 2012-10-16: 1 via ORAL
  Filled 2012-10-15: qty 1

## 2012-10-15 MED ORDER — GI COCKTAIL ~~LOC~~
30.0000 mL | Freq: Once | ORAL | Status: AC
  Administered 2012-10-15: 30 mL via ORAL
  Filled 2012-10-15: qty 30

## 2012-10-15 MED ORDER — OXYCODONE HCL 5 MG PO TABS: 2.5000 mg | ORAL_TABLET | Freq: Four times a day (QID) | ORAL | Status: DC | PRN

## 2012-10-15 NOTE — Progress Notes (Addendum)
[facility] PROGRESS NOTE  [name] ZOX:[id number] DOB: Mar 28, 1929 DOA: 10/11/2012 PCP: Gwynneth Aliment, MD  Assessment/Plan:  Active Problems:   Atypical Chest pain, MI ruled out an echocardiogram shows no wall motion abnormalities.   Nausea and vomiting: No vomiting, but still nauseated   Unspecified constipation: Continue MiraLAX.   Abdominal pain, unspecified site improved after bowel movement earlier in the week.    Dehydration: Currently euvolemic   Diastolic dysfunction   GERD   HTN (hypertension)   Morbid obesity   Alzheimer's dementia   Aortic stenosis, severe: The plan initially was outpatient referral, see below   Asthma, chronic   Other and unspecified hyperlipidemia   Lung nodule: Repeat CT scan in 3 months.  Now alert, but complaining of nausea and chest pain. "Comes and goes". Pain is reproducible, so likely musculoskeletal but will check 12-lead EKG. Doubt cardiac but if continues may require inpatient cardiology evaluation. Could also be GI etiology. I will give a GI cocktail to see if that helps. Will also schedule Zofran before meals. May need GI consult for endoscopy. Will reschedule oxycodone, low dose now that she is more alert. Not stable for discharge until reliably taking by mouth. Patient's history is difficult, as she has dementia. Will order a calorie count.  Code Status: DNR Family Communication: none today Disposition Plan: ALF   Consultants:    Procedures:    Antibiotics:    HPI/Subjective: "I don't feel well". Complains of nausea and chest pain, "comes and goes". Substernal.  Objective: Filed Vitals:   10/15/12 0546  BP: 133/61  Pulse: 65  Temp: 99.1 F (37.3 C)  Resp: 18    Intake/Output Summary (Last 24 hours) at 10/15/12 0912 Last data filed at 10/14/12 1200  Gross per 24 hour  Intake      0 ml  Output      0 ml  Net      0 ml   Filed Weights   10/13/12 0425 10/14/12 0515 10/15/12 0546  Weight: 94.4 kg (208  lb 1.8 oz) 94.2 kg (207 lb 10.8 oz) 90.8 kg (200 lb 2.8 oz)    Exam:   General:  Alert. Appropriate   Cardiovascular: Regular rate rhythm without murmurs gallops rubs  Respiratory: Clear to auscultation bilaterally without wheeze rhonchi or rale  Abdomen: obese, soft, nontender  Ext:  No CCE  Data Reviewed: Basic Metabolic Panel:  Recent Labs Lab 10/11/12 0650 10/11/12 1625  NA 140  --   K 3.8  --   CL 103  --   CO2 25  --   GLUCOSE 181*  --   BUN 14  --   CREATININE 0.81 0.79  CALCIUM 10.0  --    Liver Function Tests:  Recent Labs Lab 10/11/12 0758  AST 16  ALT 9  ALKPHOS 81  BILITOT 0.4  PROT 7.9  ALBUMIN 3.4*    Recent Labs Lab 10/11/12 0758  LIPASE 16   No results found for this basename: AMMONIA,  in the last 168 hours CBC:  Recent Labs Lab 10/11/12 0650 10/11/12 1625  WBC 9.1 10.4  HGB 13.8 14.6  HCT 41.7 42.1  MCV 83.4 82.7  PLT 326 324   Cardiac Enzymes:  Recent Labs Lab 10/11/12 1625 10/11/12 2252 10/12/12 0548  TROPONINI <0.30 <0.30 <0.30   BNP (last 3 results)  Recent Labs  10/11/12 0758  PROBNP 357.3   CBG: No results found for this basename: GLUCAP,  in the last 168 hours  No results found for this or any previous visit (from the past 240 hour(s)).   Studies: No results found.  Scheduled Meds: . amLODipine  5 mg Oral Daily  . darifenacin  7.5 mg Oral Daily  . donepezil  10 mg Oral QHS  . feeding supplement  237 mL Oral TID WC  . gi cocktail  30 mL Oral Once  . heparin  5,000 Units Subcutaneous Q8H  . levothyroxine  100 mcg Oral QAC breakfast  . Memantine HCl ER  1 capsule Oral Daily  . metoprolol tartrate  25 mg Oral BID  . mometasone-formoterol  2 puff Inhalation BID  . ondansetron  4 mg Oral TID AC  . pantoprazole  40 mg Oral BID  . polyethylene glycol  17 g Oral Daily  . polyvinyl alcohol  1 drop Both Eyes TID  . venlafaxine XR  150 mg Oral Daily   Continuous Infusions:    Time spent: 35  minutes  Siaosi Alter L  [facility] Pager [phone]. If 7PM-7AM, please contact night-coverage at [email or web address], password [facility] 10/15/2012, 9:12 AM  LOS: 4 days

## 2012-10-16 MED ORDER — ENSURE COMPLETE PO LIQD: 237.0000 mL | Freq: Three times a day (TID) | ORAL | Status: DC

## 2012-10-16 MED ORDER — BISACODYL 10 MG RE SUPP: 10.0000 mg | Freq: Every day | RECTAL | Status: DC | PRN

## 2012-10-16 MED ORDER — RISPERIDONE 0.25 MG PO TABS: 0.1250 mg | ORAL_TABLET | Freq: Every day | ORAL | Status: AC

## 2012-10-16 MED ORDER — ALUM & MAG HYDROXIDE-SIMETH 200-200-20 MG/5ML PO SUSP: 30.0000 mL | Freq: Four times a day (QID) | ORAL | Status: DC | PRN

## 2012-10-16 MED ORDER — POLYETHYLENE GLYCOL 3350 17 G PO PACK: 17.0000 g | PACK | Freq: Every day | ORAL | Status: AC

## 2012-10-16 MED ORDER — OMEPRAZOLE 40 MG PO CPDR: 40.0000 mg | DELAYED_RELEASE_CAPSULE | Freq: Every day | ORAL | Status: DC

## 2012-10-16 MED ORDER — FLEET ENEMA 7-19 GM/118ML RE ENEM: 1.0000 | ENEMA | Freq: Every day | RECTAL | Status: DC | PRN

## 2012-10-16 MED ORDER — ALBUTEROL SULFATE (5 MG/ML) 0.5% IN NEBU: 2.5000 mg | INHALATION_SOLUTION | RESPIRATORY_TRACT | Status: DC | PRN

## 2012-10-16 NOTE — Progress Notes (Signed)
Report called to [facility] facility, pt for dc today via ambulance [name]

## 2012-10-16 NOTE — Progress Notes (Signed)
Brief Nutrition Note:  RD pulled to pt for calorie count. Chart reviewed, pt in the process of being d/c'd to SNF. RD will d/c calorie count.     Clarene Duke RD, LDN Pager [phone] After Hours pager [phone]

## 2012-10-16 NOTE — Progress Notes (Signed)
Physical Therapy Treatment Patient Details Name: [name] MRN: [id number] DOB: 05/09/1929 Today's Date: 10/16/2012 Time: 8469-6295 PT Time Calculation (min): 24 min  PT Assessment / Plan / Recommendation  History of Present Illness 79 y.o. female with h/o dementia admitted from SNF with vomiting, abdominal pain, dehydration, constipation.    PT Comments   Pt progressing with gait however today states she doesn't really walk at SNF but uses WC mostly. Pt reports fatigue and weakness but lacks awareness of benefit of progression of activity with strengthening. Will continue to follow to maximize function.   Follow Up Recommendations        Does the patient have the potential to tolerate intense rehabilitation     Barriers to Discharge        Equipment Recommendations       Recommendations for Other Services    Frequency     Progress towards PT Goals Progress towards PT goals: Progressing toward goals  Plan Current plan remains appropriate    Precautions / Restrictions Precautions Precautions: Fall Restrictions Weight Bearing Restrictions: No   Pertinent Vitals/Pain No pain    Mobility  Bed Mobility Rolling Right: 4: Min assist Right Sidelying to Sit: 3: Mod assist Details for Bed Mobility Assistance: cueing for sequence with assist to rotate pelvis and elevate trunk Transfers Sit to Stand: 4: Min assist;From bed;With upper extremity assist Stand to Sit: 4: Min assist;With armrests;To chair/3-in-1 Details for Transfer Assistance: cueing for hand placement with assist to control descent Ambulation/Gait Ambulation/Gait Assistance: 4: Min assist Ambulation Distance (Feet): 25 Feet Assistive device: Rolling walker Ambulation/Gait Assistance Details: cueing for posture and position in RW Gait Pattern: Step-through pattern;Decreased stride length;Trunk flexed Gait velocity: decreased Stairs: No    Exercises General Exercises - Lower Extremity Long Arc Quad:  AROM;Both;15 reps;Seated Hip ABduction/ADduction: AROM;Both;15 reps;Seated Hip Flexion/Marching: AROM;Both;15 reps;Seated   PT Diagnosis:    PT Problem List:   PT Treatment Interventions:     PT Goals (current goals can now be found in the care plan section)    Visit Information  Last PT Received On: 10/16/12 Assistance Needed: +1 History of Present Illness: 79 y.o. female with h/o dementia admitted from SNF with vomiting, abdominal pain, dehydration, constipation.     Subjective Data      Cognition  Cognition Arousal/Alertness: Awake/alert Behavior During Therapy: Flat affect Overall Cognitive Status: History of cognitive impairments - at baseline    Balance     End of Session PT - End of Session Equipment Utilized During Treatment: Gait belt Activity Tolerance: Patient limited by fatigue Patient left: in chair;with call bell/phone within reach Nurse Communication: Mobility status   GP     Delorse Lek 10/16/2012, 10:12 AM Delaney Meigs, PT [phone]

## 2012-10-16 NOTE — Progress Notes (Signed)
Clinical Social Worker facilitated patient discharge by contacting the family and facility [facility]. Family agreeable to this plan and arranging transport via EMS . CSW will sign off, as social work intervention is no longer needed.  Maree Krabbe, MSW, Theresia Majors [phone]

## 2012-10-16 NOTE — Discharge Summary (Signed)
Physician Discharge Summary  [name] [name]:[id number] DOB: 1929-05-11 DOA: 10/11/2012  PCP: Gwynneth Aliment, MD  Admit date: 10/11/2012 Discharge date: 10/16/2012  Time spent: 45 minutes  Recommendations for Outpatient Follow-up:  1. Adjust bowel regimen as needed 2. If chest pain/reflux symptoms continue, consider referral to GI as an outpatient for EGD 3. Needs repeat CT of the chest with contrast to followup lung nodules 4. Referral to cardiology once medically stabilized if daughter agrees, for aortic stenosis  Discharge Diagnoses:  Active Problems:   Chest pain, GI etiology   Nausea and vomiting likely related to severe constipation  Chronic constipation   Abdominal pain, unspecified site secondary to above   Dehydration   Diastolic dysfunction   GERD   HTN (hypertension)   Morbid obesity   Alzheimer's dementia   Aortic stenosis, severe   Asthma, chronic, stable   Other and unspecified hyperlipidemia   Lung nodule   Discharge Condition: Stable  Filed Weights   10/14/12 0515 10/15/12 0546 10/16/12 0425  Weight: 94.2 kg (207 lb 10.8 oz) 90.8 kg (200 lb 2.8 oz) 93.1 kg (205 lb 4 oz)    History of present illness:  [name] is a 79 y.o. female, demented from ALF who has had vomiting, abdominal pain, chest pain since yesterday. Pt unable to provide much history. Daughter provides most. No known F/C. No Diarrhea. Mainly severe constipation. No dyspnea. Chest pain gone, but remains nauseated. Last stool yesterday. EKG and troponin ok  Hospital Course:  The patient ruled out for MI, she was quite dehydrated on exam and given IV fluids. Abdominal films showed severe constipation. She was started on a bowel regimen and eventually had results. After bowel movement, her pain, nausea, vomiting improved. Her symptoms were felt to be related to severe constipation and reflux. Her pain medications were decreased. Her NSAIDs were stopped. She was started on protonix. She had  problems with occasional somnolence and her sedating medications were decreased. Echocardiogram showed no wall motion abnormality. She has a known history of aortic stenosis, but no echocardiogram for quite some time. The aortic stenosis was noted to be severe on echo during this hospitalization. She has seen [facility] in the past and may followup with them as an outpatient. The daughter wishes to have her GI issues stabilized first. CT angiogram of the chest showed no pulmonary embolus but did show subcentimeter nodules as detailed below. She will need a repeat CAT scan in 3 months. Patient's oral intake has finally improved and she is tolerating her meals and Ensure. Should she continue to have nausea, GI related chest pain, poor oral intake, may consider EGD as an outpatient. Her bowel regimen has been changed to keep her more regular.  Procedures:  none  Consultations:  none  Discharge Exam: Filed Vitals:   10/16/12 1030  BP: 103/66  Pulse: 61  Temp:   Resp:     General: Comfortable. In chair. Drinking in sure. Abdomen: Soft nontender nondistended  Discharge Instructions     Medication List    STOP taking these medications       docusate sodium 100 MG capsule  Commonly known as:  COLACE     furosemide 20 MG tablet  Commonly known as:  LASIX     loratadine 10 MG tablet  Commonly known as:  CLARITIN     methocarbamol 500 MG tablet  Commonly known as:  ROBAXIN     morphine 15 MG 12 hr tablet  Commonly known  as:  MS CONTIN     naproxen sodium 220 MG tablet  Commonly known as:  ANAPROX     promethazine 25 MG/ML injection  Commonly known as:  PHENERGAN      TAKE these medications       acetaminophen 325 MG tablet  Commonly known as:  TYLENOL  Take 650 mg by mouth every 8 (eight) hours as needed for pain.     albuterol (5 MG/ML) 0.5% nebulizer solution  Commonly known as:  PROVENTIL  Take 0.5 mLs (2.5 mg total) by nebulization every 4 (four) hours as  needed. wheezing     alum & mag hydroxide-simeth 200-200-20 MG/5ML suspension  Commonly known as:  MAALOX/MYLANTA  Take 30 mLs by mouth every 6 (six) hours as needed.     amLODipine 5 MG tablet  Commonly known as:  NORVASC  Take 5 mg by mouth daily.     bisacodyl 10 MG suppository  Commonly known as:  DULCOLAX  Place 1 suppository (10 mg total) rectally daily as needed.     donepezil 10 MG tablet  Commonly known as:  ARICEPT  Take 10 mg by mouth at bedtime.     feeding supplement Liqd  Take 237 mLs by mouth 3 (three) times daily with meals.     Fluticasone-Salmeterol 250-50 MCG/DOSE Aepb  Commonly known as:  ADVAIR  Inhale 1 puff into the lungs every 12 (twelve) hours.     levothyroxine 100 MCG tablet  Commonly known as:  SYNTHROID, LEVOTHROID  Take 100 mcg by mouth daily before breakfast.     magnesium hydroxide 400 MG/5ML suspension  Commonly known as:  MILK OF MAGNESIA  Take 30 mLs by mouth daily as needed for constipation.     metoprolol tartrate 25 MG tablet  Commonly known as:  LOPRESSOR  Take 25 mg by mouth 2 (two) times daily.     NAMENDA XR 28 MG Cp24  Generic drug:  Memantine HCl ER  Take 1 capsule by mouth daily.     omeprazole 40 MG capsule  Commonly known as:  PRILOSEC  Take 1 capsule (40 mg total) by mouth daily.     oxyCODONE 5 MG immediate release tablet  Commonly known as:  Oxy IR/ROXICODONE  Take 2.5 mg by mouth every 4 (four) hours as needed for pain.     polyethylene glycol packet  Commonly known as:  MIRALAX / GLYCOLAX  Take 17 g by mouth daily.     risperiDONE 0.25 MG tablet  Commonly known as:  RISPERDAL  Take 0.5 tablets (0.125 mg total) by mouth at bedtime.     rosuvastatin 5 MG tablet  Commonly known as:  CRESTOR  Take 5 mg by mouth daily.     senna-docusate 8.6-50 MG per tablet  Commonly known as:  Senokot-S  Take 1 tablet by mouth 2 (two) times daily.     sodium phosphate 7-19 GM/118ML Enem  Place 1 enema rectally daily as  needed.     solifenacin 5 MG tablet  Commonly known as:  VESICARE  Take 10 mg by mouth daily.     SYSTANE 0.4-0.3 % Soln  Generic drug:  Polyethyl Glycol-Propyl Glycol  Place 1 drop into both eyes 3 (three) times daily.     venlafaxine XR 150 MG 24 hr capsule  Commonly known as:  EFFEXOR-XR  Take 150 mg by mouth daily.       Allergies  Allergen Reactions  . Penicillins Other (See Comments)  unknown       Follow-up Information   Follow up with Gwynneth Aliment, MD In 2 weeks.   Contact information:   [address] [place] [address] [phone]        The results of significant diagnostics from this hospitalization (including imaging, microbiology, ancillary and laboratory) are listed below for reference.    Significant Diagnostic Studies: Ct Angio Chest Pe W/cm &/or Wo Cm  10/12/2012   *RADIOLOGY REPORT*  Clinical Data: Chest pain  CT ANGIOGRAPHY CHEST  Technique:  Multidetector CT imaging of the chest using the standard protocol during bolus administration of intravenous contrast. Multiplanar reconstructed images including MIPs were obtained and reviewed to evaluate the vascular anatomy.  Contrast: OMNIPAQUE IOHEXOL 350 MG/ML SOLN  Comparison: Chest radiograph October 11, 2012 and CT angiogram chest March 12, 2011.  Findings: There is no demonstrable pulmonary embolus.  There is no thoracic aortic aneurysm or dissection. There is stable atherosclerotic change in the aorta with peripheral thrombus in the descending aorta which is stable.  There is no appreciable thoracic adenopathy.  The pericardium is not thickened.  There is a new 9 x 9 mm nodular opacity in the medial segment of the right middle lobe, seen on slice 42.  There is a stable 5 mm nodular opacity in the medial segment of the right middle lobe base, seen on slice 40. There is mild subsegmental atelectasis in both lower lobes.  There is a small hiatal hernia.  In the visualized upper abdomen,  there is atherosclerotic change in the aorta and fatty change in the liver.  Visualized upper abdominal structures otherwise appear normal.  There is degenerative change in the thoracic spine.  There are no blastic or lytic bone lesions.  Visualized portions of thyroid appear unremarkable.  IMPRESSION: There is a new 9 x 9 mm nodular lesion in the right middle lobe. This finding warrants further evaluation.  Given this finding, it may be reasonable to correlate with nuclear medicine PET / CT examination to further evaluate.  At a minimum, a follow-up chest CT in 3 months to assess for stability would be advisable.  There is a stable 5 mm nodular lesion in the right middle lobe as well.  There is no demonstrable pulmonary embolus.  There is no airspace consolidation or edema.  There is no demonstrable pulmonary embolus.  There is stable atherosclerotic change in the aorta.  There is a small hiatal hernia.  There is fatty change in the liver.   Original Report Authenticated By: Bretta Bang, M.D.   Dg Chest Port 1 View  10/11/2012   *RADIOLOGY REPORT*  Clinical Data: Chest pain  PORTABLE CHEST - 1 VIEW  Comparison: Prior radiograph from 04/12/2011  Findings: The cardiac and mediastinal silhouettes are stable in size and contour, and remain within normal limits.  The lungs are normally inflated.  A vague opacity overlying the lingula and left lung base is similar as compared to the prior exam, likely represents atelectasis / scarring and prominent mediastinal fat pad.  No definite airspace consolidation identified.  There is mild pulmonary vascular congestion without overt pulmonary edema. Pneumothorax or pulmonary edema.  Nodular opacity overlying the lower left chest wall likely represents the nipple in profile as it is surrounded by gas lucency.  No acute osseous abnormalities identified.  IMPRESSION: Hypoinflation with mild pulmonary vascular congestion.   Original Report Authenticated By: Rise Mu, M.D.   Dg Abd 2 Views  10/11/2012   *  RADIOLOGY REPORT*  Clinical Data: Abdominal pain, nausea  ABDOMEN - 2 VIEW  Comparison: CT abdomen pelvis dated 03/06/2011  Findings: Nonobstructive bowel gas pattern.  No evidence of free air on the lateral decubitus view.  Moderate colonic stool burden.  Moderate degenerative changes of the right hip.  Mild degenerative changes of the left hip.  IMPRESSION: No evidence of small bowel obstruction or free air.  Moderate colonic stool burden, raising the possibility of constipation.   Original Report Authenticated By: Charline Bills, M.D.    Microbiology: No results found for this or any previous visit (from the past 240 hour(s)).   Labs: Basic Metabolic Panel:  Recent Labs Lab 10/11/12 0650 10/11/12 1625  NA 140  --   K 3.8  --   CL 103  --   CO2 25  --   GLUCOSE 181*  --   BUN 14  --   CREATININE 0.81 0.79  CALCIUM 10.0  --    Liver Function Tests:  Recent Labs Lab 10/11/12 0758  AST 16  ALT 9  ALKPHOS 81  BILITOT 0.4  PROT 7.9  ALBUMIN 3.4*    Recent Labs Lab 10/11/12 0758  LIPASE 16   No results found for this basename: AMMONIA,  in the last 168 hours CBC:  Recent Labs Lab 10/11/12 0650 10/11/12 1625  WBC 9.1 10.4  HGB 13.8 14.6  HCT 41.7 42.1  MCV 83.4 82.7  PLT 326 324   Cardiac Enzymes:  Recent Labs Lab 10/11/12 1625 10/11/12 2252 10/12/12 0548  TROPONINI <0.30 <0.30 <0.30   BNP: BNP (last 3 results)  Recent Labs  10/11/12 0758  PROBNP 357.3   CBG: No results found for this basename: GLUCAP,  in the last 168 hours  Signed:  Tristina Sahagian L  [facility] 10/16/2012, 1:23 PM

## 2012-10-17 ENCOUNTER — Other Ambulatory Visit: Payer: Self-pay | Admitting: Geriatric Medicine

## 2012-10-17 MED ORDER — OXYCODONE HCL 5 MG PO TABS: 2.5000 mg | ORAL_TABLET | ORAL | Status: DC | PRN

## 2012-10-27 ENCOUNTER — Other Ambulatory Visit: Payer: Self-pay | Admitting: *Deleted

## 2012-10-27 MED ORDER — TEMAZEPAM 15 MG PO CAPS: ORAL_CAPSULE | ORAL | Status: DC

## 2012-11-08 ENCOUNTER — Encounter: Payer: Medicare Other | Admitting: Surgery

## 2012-11-22 ENCOUNTER — Encounter: Payer: Medicare Other | Admitting: Surgery

## 2012-11-28 ENCOUNTER — Non-Acute Institutional Stay (SKILLED_NURSING_FACILITY): Payer: PRIVATE HEALTH INSURANCE | Admitting: Internal Medicine

## 2012-11-28 DIAGNOSIS — J45909 Unspecified asthma, uncomplicated: Secondary | ICD-10-CM

## 2012-11-28 DIAGNOSIS — I350 Nonrheumatic aortic (valve) stenosis: Secondary | ICD-10-CM

## 2012-11-28 DIAGNOSIS — I359 Nonrheumatic aortic valve disorder, unspecified: Secondary | ICD-10-CM

## 2012-11-28 DIAGNOSIS — M4716 Other spondylosis with myelopathy, lumbar region: Secondary | ICD-10-CM

## 2012-11-28 NOTE — Progress Notes (Signed)
Patient ID: [name], female   DOB: 05/05/1929, 79 y.o.   MRN: [id number]  Facility; [facility]. Chief complaint; [facility] monthly visit for August. Followup back pain and AS History; patient has been in the building since February of 2013. Major issue recently has been lumbar spinal stenosis without myelopathy. She has been suggested for an epidural steroid injection. Which was done on 09/11/2012. States major improvement.   She was in the hospital with chest pain from August 6 through August 11. She ruled out for ischemia but had severe aortic stenosis per echo [see below]. Study was worse than the last study of January 2013. She's been to see cardiology the family did not want further aggressive measures such he was also found to have a new 9 x 9 mm lesion of the right middle lobe and a stable 5 mm lesion in the right middle lobe. It was recommended that she have a followup CT scan in 3 months   has a past medical history of Asthma; Shortness of breath; Hypertension; and Blood transfusion, lumbar spinal stenosis, chronic diastolic heart failure, chronic renal failure stage II, hypothyroidism, on replacement, depression, hypercholesterolemia   LV EF: 60% -   65%  ------------------------------------------------------------ Indications:      Murmur 785.2.  ------------------------------------------------------------ History:   PMH:   Chest pain.  Risk factors:  Hypertension. Dyslipidemia.  ------------------------------------------------------------ Study Conclusions  - Left ventricle: The cavity size was normal. Wall thickness   was increased in a pattern of mild LVH. Systolic function   was normal. The estimated ejection fraction was in the   range of 60% to 65%. Wall motion was normal; there were no   regional wall motion abnormalities. Doppler parameters are   consistent with abnormal left ventricular relaxation   (grade 1 diastolic dysfunction). - Aortic valve: There was  severe stenosis. Trivial   regurgitation. Valve area: 0.69cm^2(VTI). Valve area:   0.89cm^2 (Vmax). - Mitral valve: Calcified annulus. Transthoracic echocardiography.  M-mode, complete 2D, spectral Doppler, and color Doppler.  Weight:  Weight: 94.5kg. Weight: 208lb.  Blood pressure:     135/78.  Patient status:  Inpatient.  Location:  Echo laboratory.  ------------------------------------------------------------  ------------------------------------------------------------ Left ventricle:  The cavity size was normal. Wall thickness was increased in a pattern of mild LVH. Systolic function was normal. The estimated ejection fraction was in the range of 60% to 65%. Wall motion was normal; there were no regional wall motion abnormalities. Doppler parameters are consistent with abnormal left ventricular relaxation (grade 1 diastolic dysfunction).  ------------------------------------------------------------ Aortic valve:   Trileaflet.  Doppler:   There was severe stenosis.    Trivial regurgitation.    VTI ratio of LVOT to aortic valve: 0.22. Valve area: 0.69cm^2(VTI). Peak velocity ratio of LVOT to aortic valve: 0.29. Valve area: 0.89cm^2 (Vmax).    Mean gradient: 42mm Hg (S). Peak gradient: 66mm Hg (S).  ------------------------------------------------------------ Aorta:  The aorta was normal, not dilated, and non-diseased.   ------------------------------------------------------------ Mitral valve:   Calcified annulus.  Doppler:   No significant regurgitation.    Peak gradient: 3mm Hg (D).  ------------------------------------------------------------ Left atrium:  The atrium was at the upper limits of normal in size.  ------------------------------------------------------------ Right ventricle:  The cavity size was normal. Wall thickness was normal. Systolic function was normal.  ------------------------------------------------------------ Pulmonic valve:    Structurally  normal valve.   Cusp separation was normal.  Doppler:  Transvalvular velocity was within the normal range.  No significant regurgitation.  ------------------------------------------------------------ Tricuspid valve:  Structurally normal valve.   Leaflet separation was normal.  Doppler:  Transvalvular velocity was within the normal range.  Trivial regurgitation.  ------------------------------------------------------------ Right atrium:  The atrium was normal in size.  ------------------------------------------------------------ Pericardium:  The pericardium was normal in appearance.   ------------------------------------------------------------ Post procedure conclusions Ascending Aorta:  - The aorta was normal, not dilated, and non-diseased.  ------------------------------------------------------------  2D measurements        Normal  Doppler               Normal Left ventricle                 measurements LVID ED,     41.6 mm   43-52   Left ventricle chord, PLAX                    Ea, lat      7.02 cm/ ------- LVID ES,     25.1 mm   23-38   ann, tiss         s chord, PLAX                    DP FS, chord,     40 %    >29     E/Ea, lat   11.41     ------- PLAX                           ann, tiss LVPW, ED     12.2 mm   ------  DP IVS/LVPW     1.06      <1.3    Ea, med      7.35 cm/ ------- ratio, ED                      ann, tiss         s Ventricular septum             DP IVS, ED      12.9 mm   ------  E/Ea, med    10.9     ------- LVOT                           ann, tiss Diam, S        20 mm   ------  DP Area         3.14 cm^2 ------  LVOT Aorta                          Peak vel, S   116 cm/ ------- Root diam,     30 mm   ------                    s ED                             VTI, S       21.4 cm  ------- Left atrium                    Peak            5 mm  ------- AP dim         39 mm   ------  gradient, S       Hg  Aortic valve                                 Peak vel, S   407 cm/ -------                                                  s                                Mean vel, S   308 cm/ -------                                                  s                                VTI, S       96.7 cm  -------                                Mean           42 mm  -------                                gradient, S       Hg                                Peak           66 mm  -------                                gradient, S       Hg                                VTI ratio    0.22     -------                                LVOT/AV                                Area, VTI    0.69 cm^ -------                                                  2                                Peak vel     0.29     -------  ratio,                                LVOT/AV                                Area, Vmax   0.89 cm^ -------                                                  2                                Regurg PHT    671 ms  -------                                Mitral valve                                Peak E vel   80.1 cm/ -------                                                  s                                Peak A vel    129 cm/ -------                                                  s                                Deceleratio   180 ms  150-230                                n time                                Peak            3 mm  -------                                gradient, D       Hg                                Peak E/A      0.6     -------  ratio                                Right ventricle                                Sa vel, lat  17.7 cm/ -------                                ann, tiss         s                                DP     Past Surgical History  Procedure Laterality Date  . Colonoscopy  03/08/2011    Procedure: COLONOSCOPY;   Surgeon: Erick Blinks, MD;  Location: [facility] ENDOSCOPY;  Service: Gastroenterology;  Laterality: N/A;  Medication list is reviewed;  Physical exam. Respiratory clear entry bilaterally. Cardiac.2/6 short midsystolic murmer, non radiating. Neurologic; able to stand however, very ataxic gait. Mostly, propels herself in the facility in a wheelchair.  Impression/plan  #1 lumbar spinal stenosis. Much improved with recent epidural injection. #2 asthma. This has not been unstable. She is on an Advair Diskus. #3 aortic stenosis. Apparently the family does not want a surgical consultation #4 pulmonary nodules; I am not sure that this patient should be aggressively followed for the nodules since she is not to be a surgical candidate secondary to her aortic valve disease.

## 2013-01-02 ENCOUNTER — Non-Acute Institutional Stay (SKILLED_NURSING_FACILITY): Payer: PRIVATE HEALTH INSURANCE | Admitting: Internal Medicine

## 2013-01-02 DIAGNOSIS — M4716 Other spondylosis with myelopathy, lumbar region: Secondary | ICD-10-CM

## 2013-01-02 DIAGNOSIS — I359 Nonrheumatic aortic valve disorder, unspecified: Secondary | ICD-10-CM

## 2013-01-02 DIAGNOSIS — I350 Nonrheumatic aortic (valve) stenosis: Secondary | ICD-10-CM

## 2013-01-02 NOTE — Progress Notes (Signed)
Patient ID: [name], female   DOB: Jul 08, 1929, 79 y.o.   MRN: [id number]  Facility; [facility]. Chief complaint; [facility] monthly visit for September. Followup back pain and AS History; patient has been in the building since February of 2013. Major issue recently has been lumbar spinal stenosis without myelopathy. She has been suggested for an epidural steroid injection. Which was done on 09/11/2012. States major improvement.   She was in the hospital with chest pain from August 6 through August 11. She ruled out for ischemia but had severe aortic stenosis per echo [see below]. Study was worse than the last study of January 2013. She's been to see cardiology the family did not want further aggressive measures such he was also found to have a new 9 x 9 mm lesion of the right middle lobe and a stable 5 mm lesion in the right middle lobe. It was recommended that she have a followup CT scan in 3 months   has a past medical history of Asthma; Shortness of breath; Hypertension; and Blood transfusion, lumbar spinal stenosis, chronic diastolic heart failure, chronic renal failure stage II, hypothyroidism, on replacement, depression, hypercholesterolemia   has a past medical history of Asthma; Shortness of breath; Hypertension; Blood transfusion; Dementia with behavioral disturbance; Other malaise and fatigue; Esophageal reflux; Essential hypertension, benign; Unspecified diastolic heart failure; Personal history of alcoholism; Hypopotassemia; and Anemia.  Past Surgical History  Procedure Laterality Date  . Colonoscopy  03/08/2011    Procedure: COLONOSCOPY;  Surgeon: Erick Blinks, MD;  Location: [facility] ENDOSCOPY;  Service: Gastroenterology;  Laterality: N/A;  Medication list is reviewed;  Physical exam. Respiratory clear entry bilaterally. Cardiac.2/6 short midsystolic murmer, non radiating. Neurologic; able to stand however, very ataxic gait. Mostly, propels herself in the facility in a  wheelchair.  Impression/plan  #1 lumbar spinal stenosis. Much improved with recent epidural injection. #2 asthma. This has not been unstable. She is on an Advair Diskus. #3 aortic stenosis. Apparently the family does not want a surgical consultation. She does not appear to be overtly symptomatic at the present time. #4 pulmonary nodules; I am not sure that this patient should be aggressively followed for the nodules since she is not to be a surgical candidate secondary to her aortic valve disease.

## 2013-01-03 IMAGING — CR DG CHEST 2V
2 series · 2 of 2 positions shown · non-contrast
Comparison: [DATE] and 07/13/2009

CLINICAL DATA: Shortness of breath, dizziness, and weakness.

CHEST - 2 VIEW

[w chest pa]
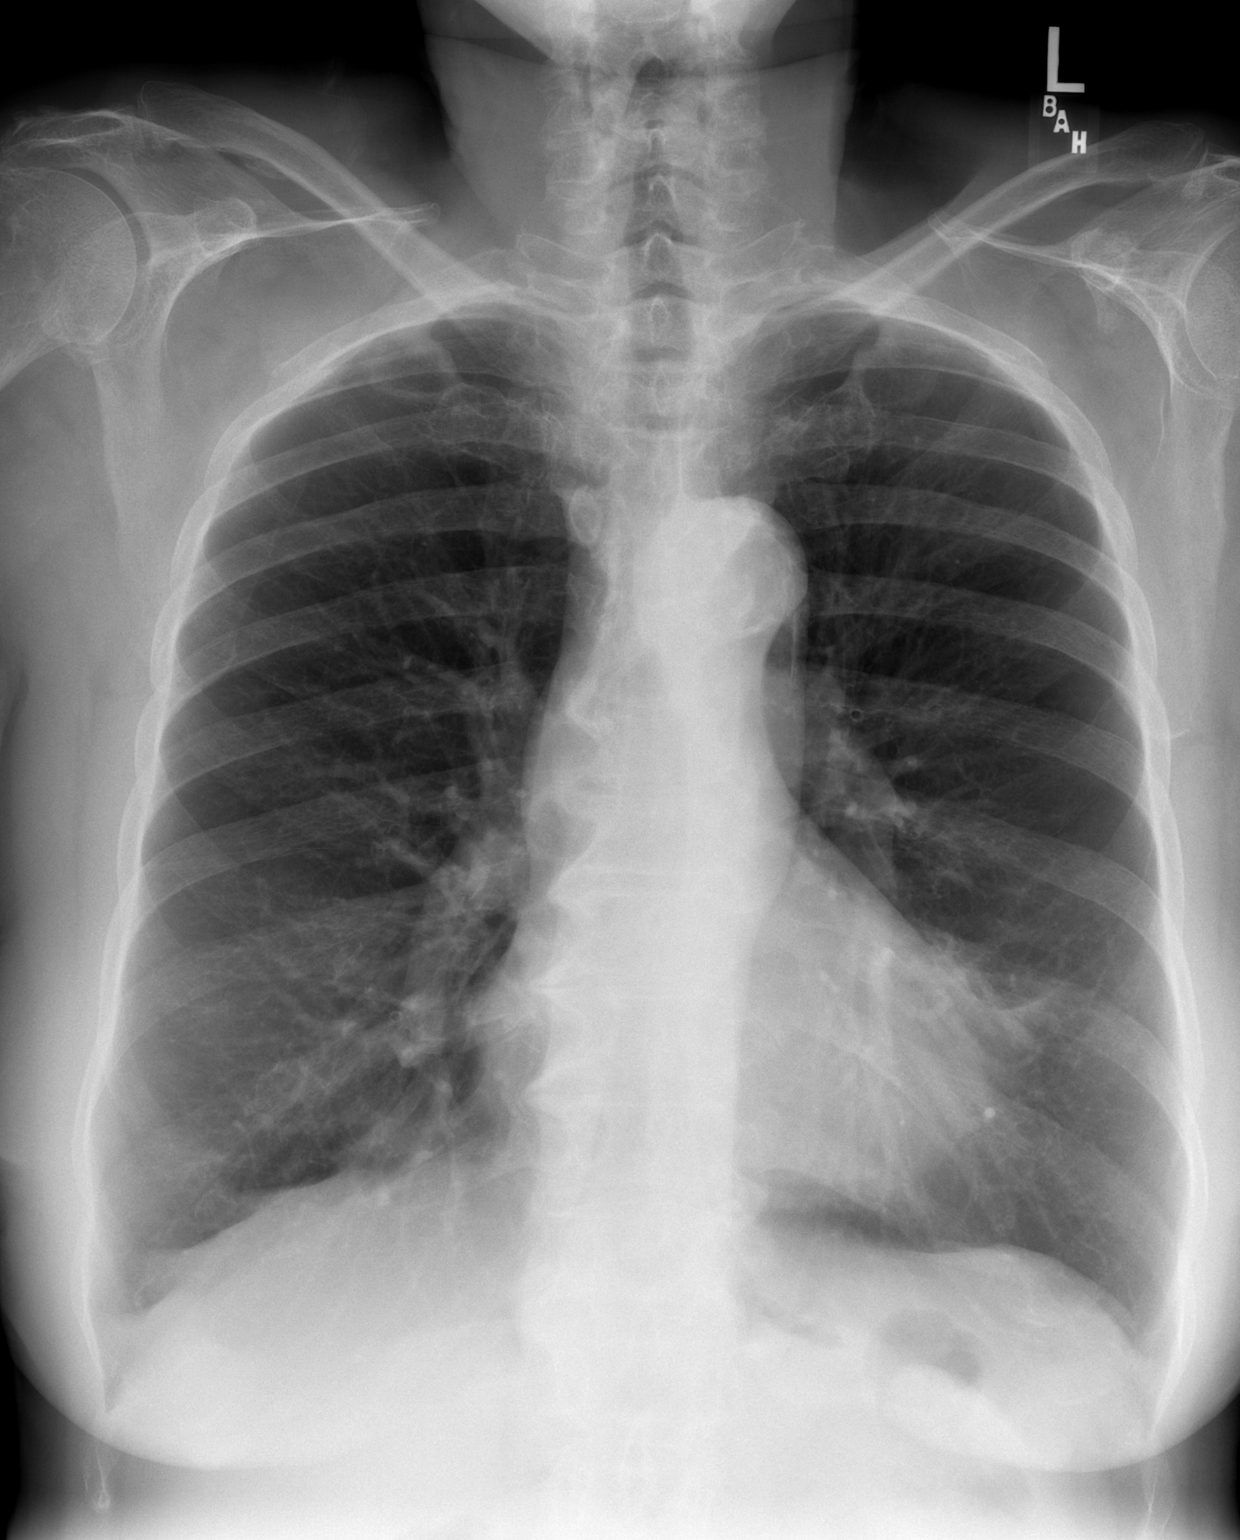

[w chest lat]
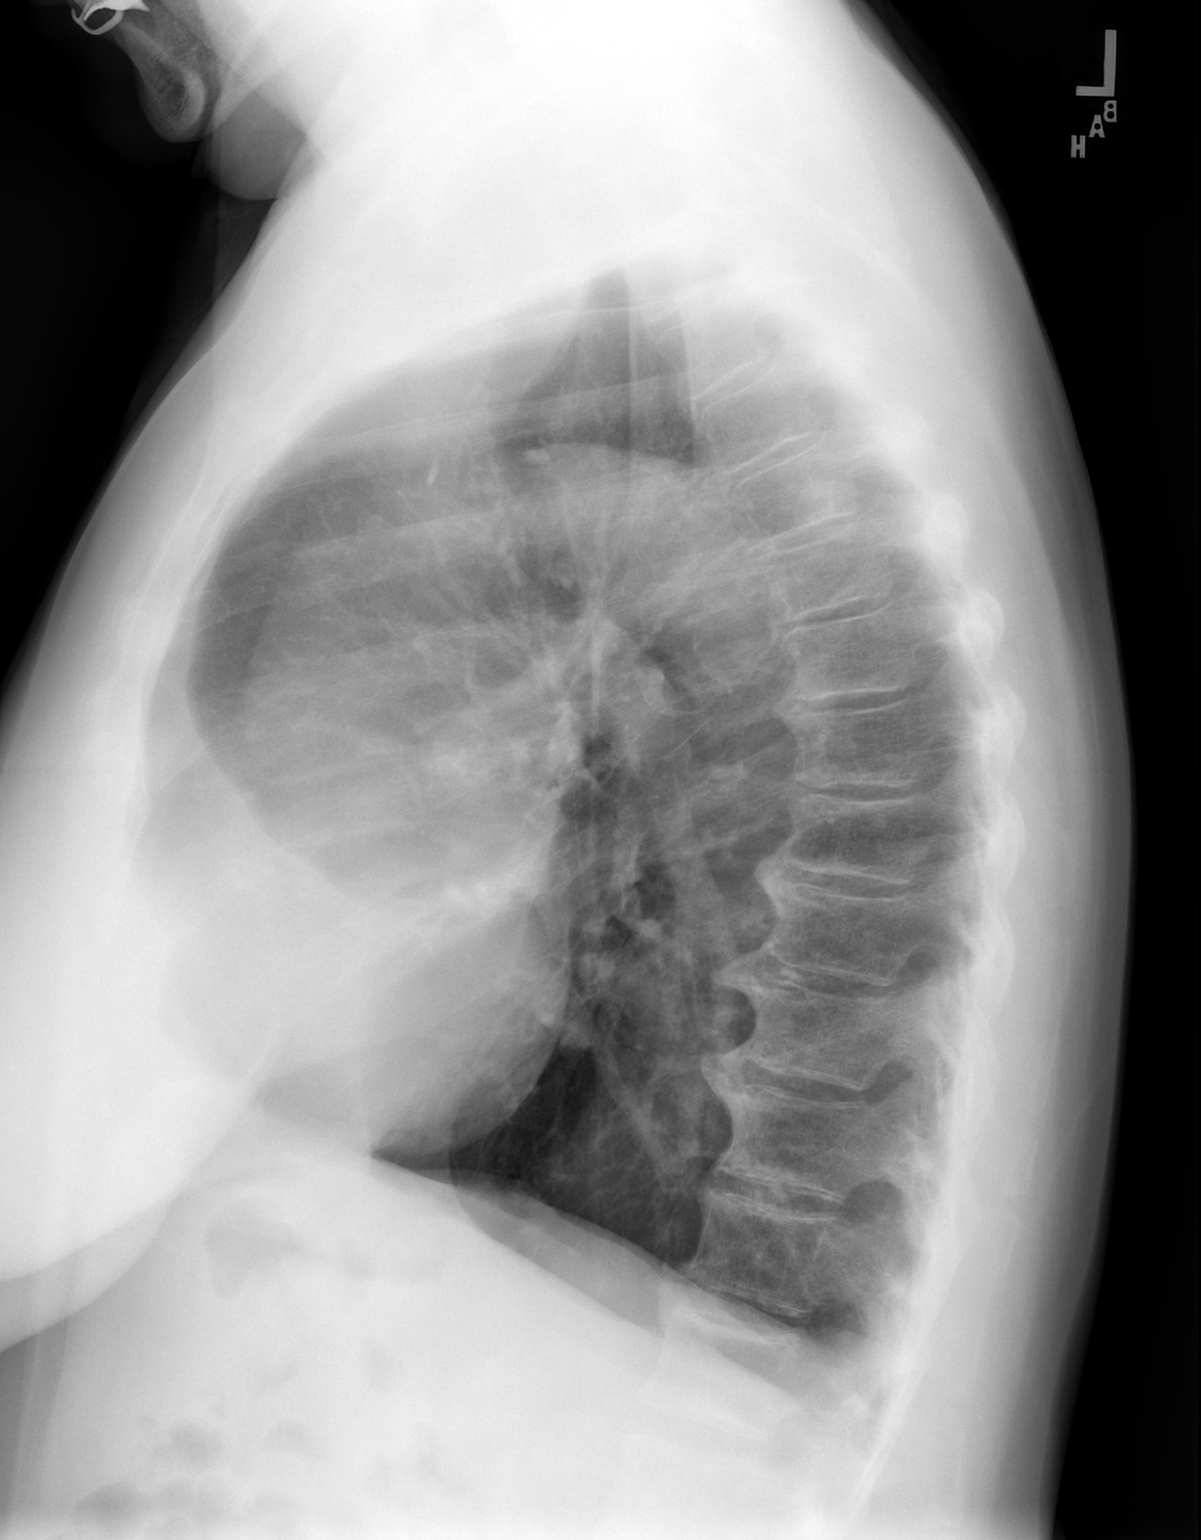

[2 of 2 positions shown; findings below may reference images not displayed]

FINDINGS: The heart size and pulmonary vascularity are normal.  The
lungs are somewhat hyperinflated suggesting emphysema.  There are
no acute infiltrates or effusions.  There is slight scarring in the
lingula, stable.  No acute osseous abnormalities.
IMPRESSION: No acute abnormalities.

## 2013-01-03 IMAGING — CT CT HEAD W/O CM
2 series · 16 of 30 positions shown, 20 images · non-contrast
Comparison: 03/02/2010 and 11/19/2007.

CLINICAL DATA: Weakness.  High blood pressure.  Alcohol abuse.

CT HEAD WITHOUT CONTRAST
TECHNIQUE: Contiguous axial images were obtained from the base of
the skull through the vertex without contrast.

[Series 2: head w/o · axial · non-contrast · 0.49mm/px · z∈[+98,+228]mm · 13 of 32 slices shown, 17 images]
[im 3/32  brain]
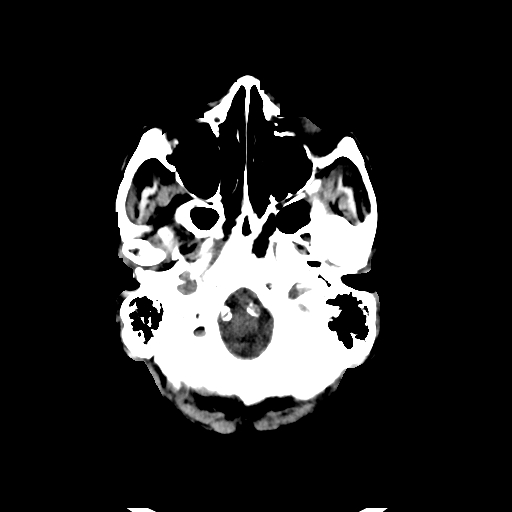
[im 3/32  bone]
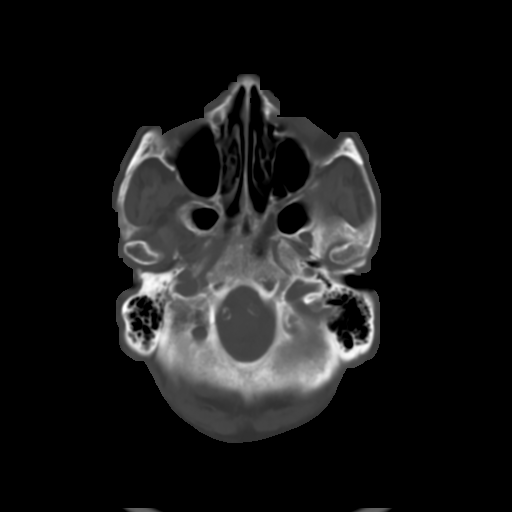
[im 5/32  brain]
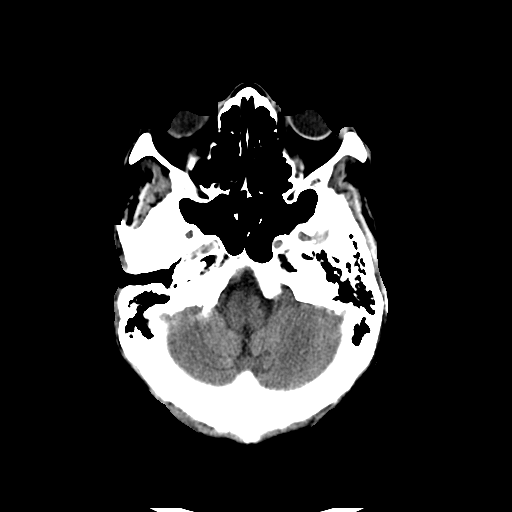
[im 7/32  brain]
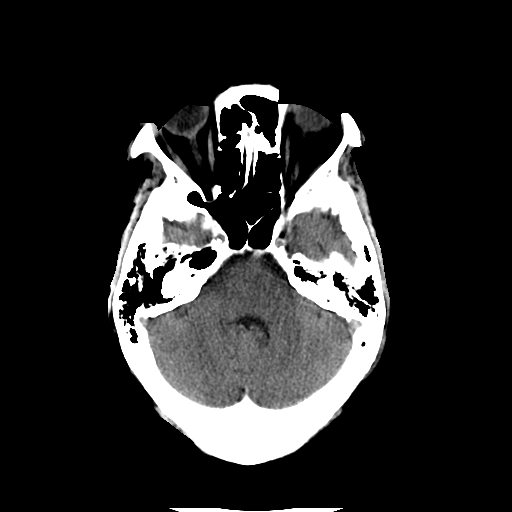
[im 9/32  brain]
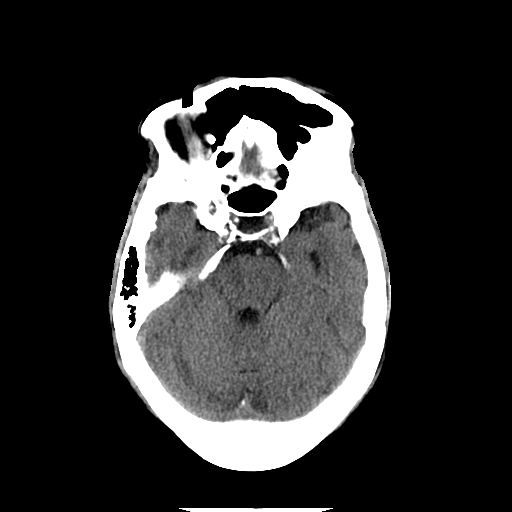
[im 12/32  brain]
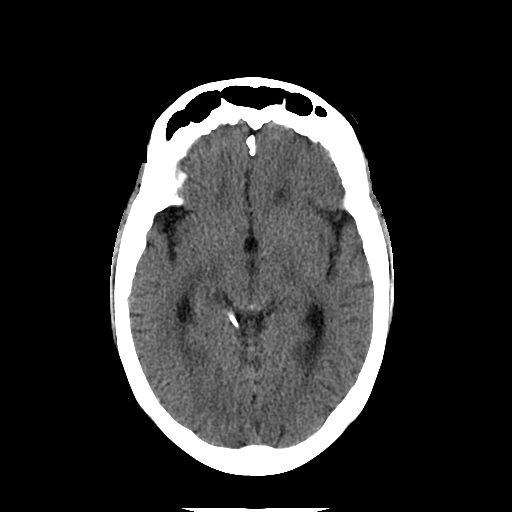
[im 12/32  bone]
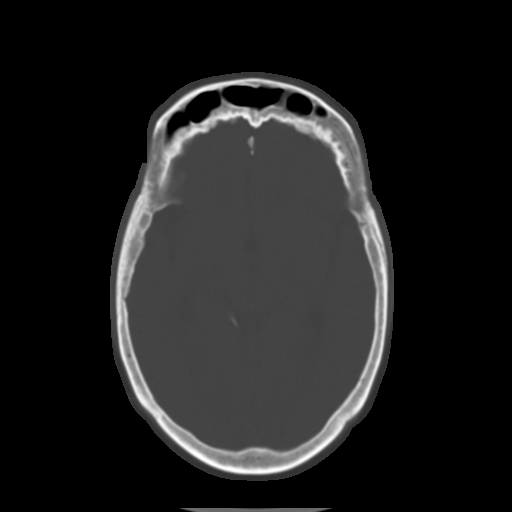
[im 14/32  brain]
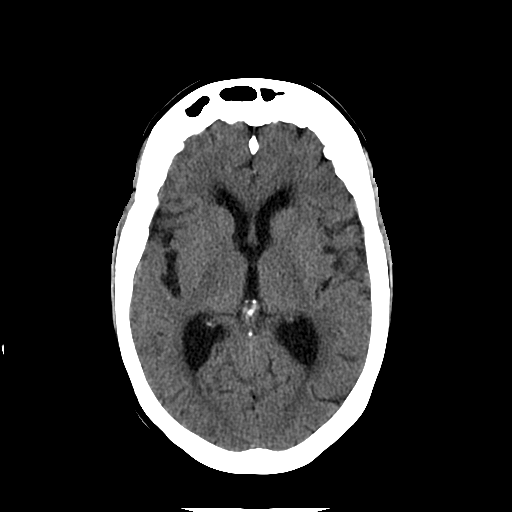
[im 16/32  brain]
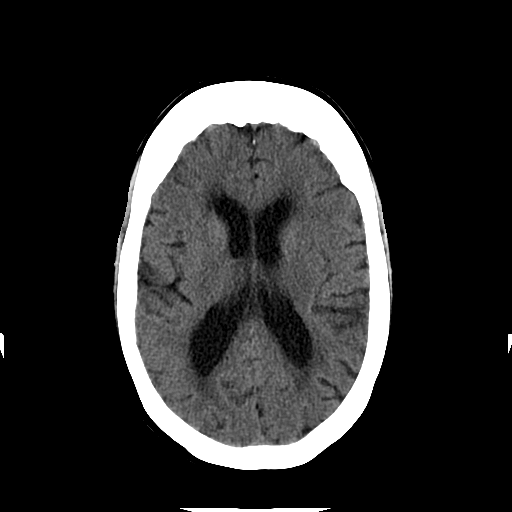
[im 18/32  brain]
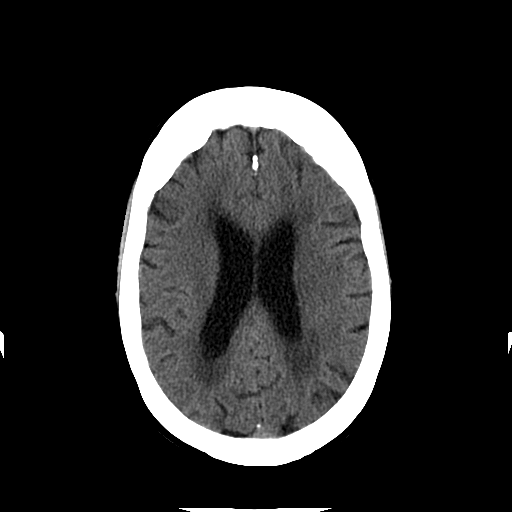
[im 20/32  brain]
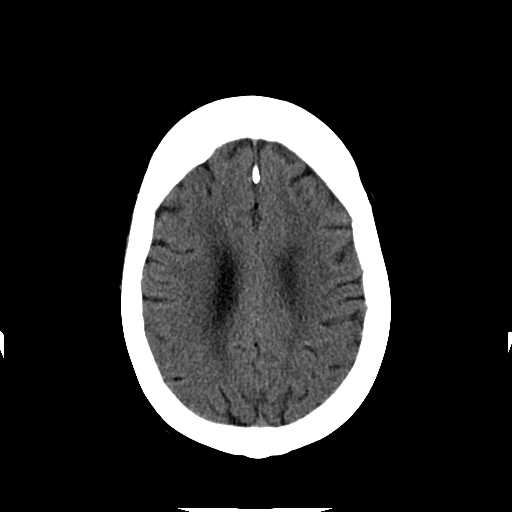
[im 20/32  bone]
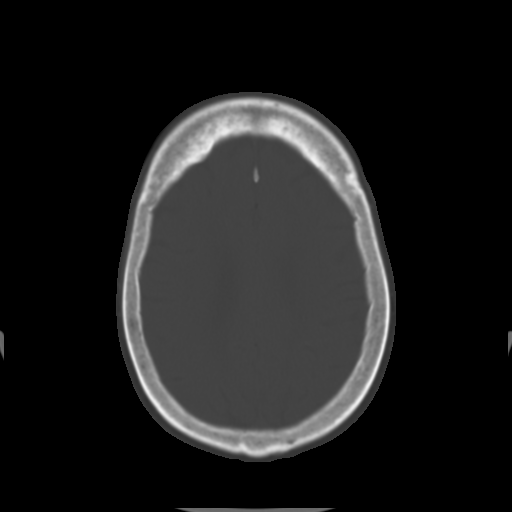
[im 23/32  brain]
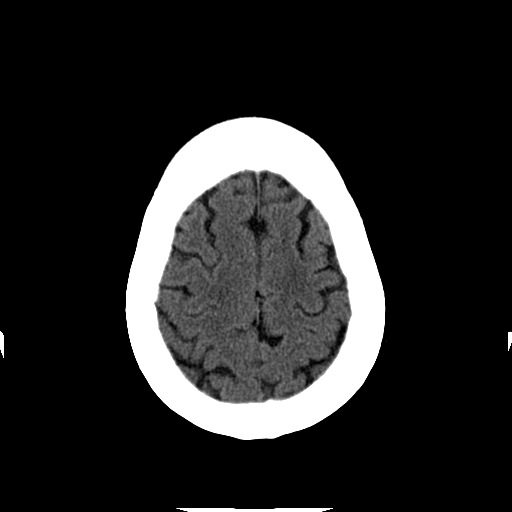
[im 25/32  brain]
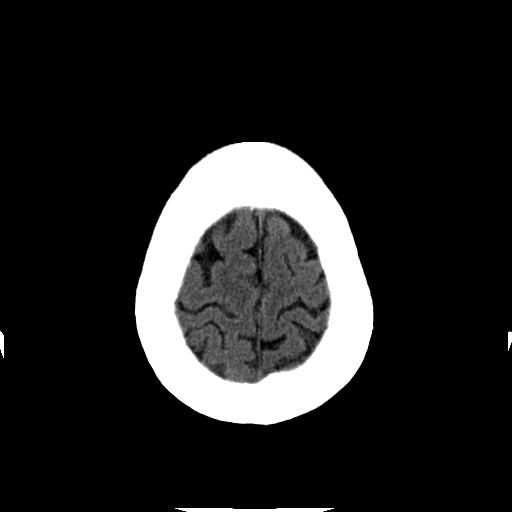
[im 27/32  brain]
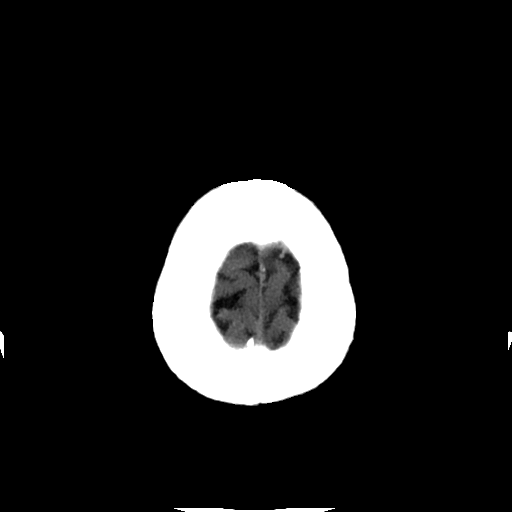
[im 29/32  brain]
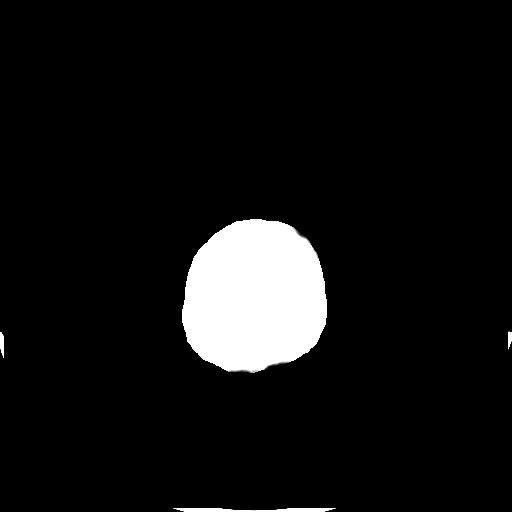
[im 29/32  bone]
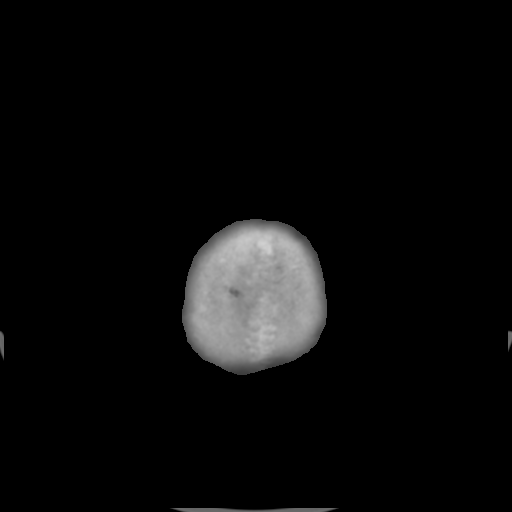

[Series 3: head w/o bone · axial · non-contrast · 0.49mm/px · z∈[+98,+143]mm · 3 of 32 slices shown]
[im 3/32  bone]
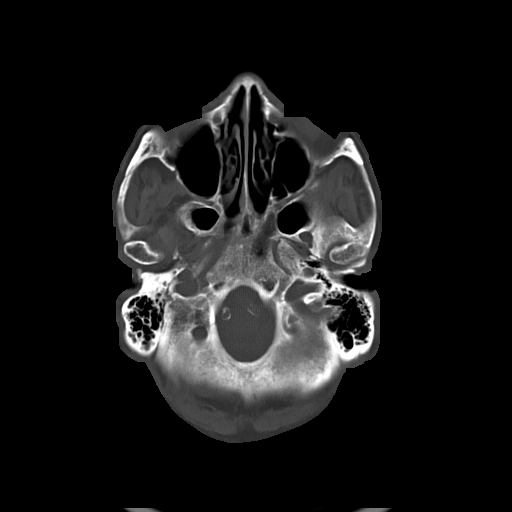
[im 7/32  bone]
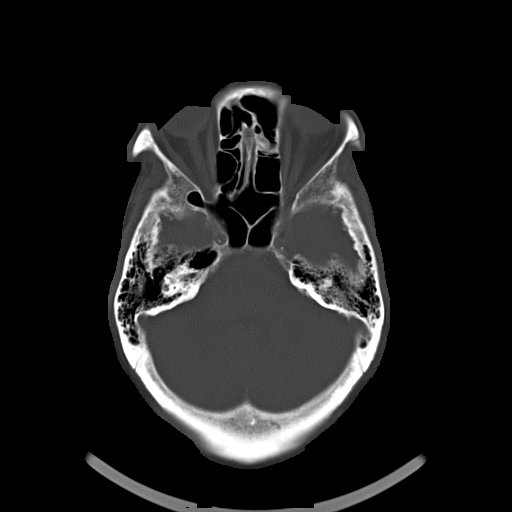
[im 12/32  bone]
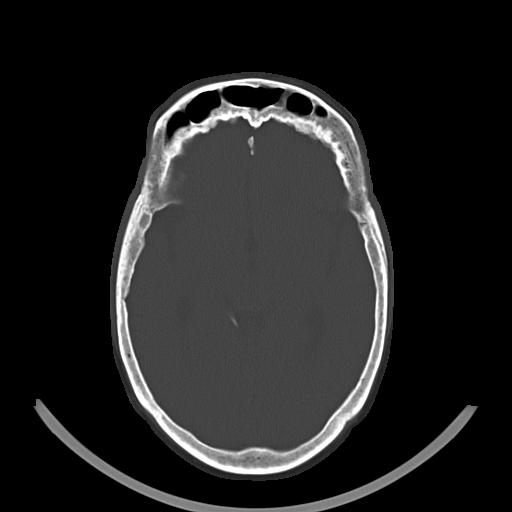

[16 of 30 positions shown; findings below may reference images not displayed]

FINDINGS: No intracranial hemorrhage.

Moderate small vessel disease type changes. No CT evidence of large
acute infarct.  Small acute infarct cannot be excluded by CT.No
intracranial mass detected on this unenhanced exam.

Vascular calcifications.  Small osteoma left ethmoid sinus air
cells unchanged.
IMPRESSION: No intracranial hemorrhage or CT evidence of large acute infarct.

Small vessel disease type changes.

Prominent intracranial vascular calcifications.

## 2013-01-16 ENCOUNTER — Other Ambulatory Visit (HOSPITAL_BASED_OUTPATIENT_CLINIC_OR_DEPARTMENT_OTHER): Payer: Self-pay | Admitting: Internal Medicine

## 2013-01-16 DIAGNOSIS — R911 Solitary pulmonary nodule: Secondary | ICD-10-CM

## 2013-01-24 ENCOUNTER — Ambulatory Visit (HOSPITAL_COMMUNITY)
Admission: RE | Admit: 2013-01-24 | Discharge: 2013-01-24 | Disposition: A | Discharge status: Home / Self Care | Payer: Medicare Other | Source: Ambulatory Visit | Attending: Internal Medicine | Admitting: Internal Medicine

## 2013-01-24 DIAGNOSIS — J984 Other disorders of lung: Secondary | ICD-10-CM | POA: Insufficient documentation

## 2013-01-24 DIAGNOSIS — I2510 Atherosclerotic heart disease of native coronary artery without angina pectoris: Secondary | ICD-10-CM | POA: Insufficient documentation

## 2013-01-24 DIAGNOSIS — I359 Nonrheumatic aortic valve disorder, unspecified: Secondary | ICD-10-CM | POA: Insufficient documentation

## 2013-01-24 DIAGNOSIS — R911 Solitary pulmonary nodule: Secondary | ICD-10-CM | POA: Insufficient documentation

## 2013-01-24 MED ORDER — IOHEXOL 300 MG/ML  SOLN
80.0000 mL | Freq: Once | INTRAMUSCULAR | Status: AC | PRN
  Administered 2013-01-24: 80 mL via INTRAVENOUS

## 2013-01-30 ENCOUNTER — Non-Acute Institutional Stay (SKILLED_NURSING_FACILITY): Payer: PRIVATE HEALTH INSURANCE | Admitting: Internal Medicine

## 2013-01-30 DIAGNOSIS — I359 Nonrheumatic aortic valve disorder, unspecified: Secondary | ICD-10-CM

## 2013-01-30 DIAGNOSIS — I350 Nonrheumatic aortic (valve) stenosis: Secondary | ICD-10-CM

## 2013-01-30 DIAGNOSIS — R911 Solitary pulmonary nodule: Secondary | ICD-10-CM

## 2013-01-30 NOTE — Progress Notes (Signed)
Patient ID: [name], female   DOB: 05-Aug-1929, 79 y.o.   MRN: [id number]  Facility; [facility]. Chief complaint; [facility] monthly visit for October. Followup back pain and AS History; patient has been in the building since February of 2013. Major issue recently has been lumbar spinal stenosis without myelopathy. She has been suggested for an epidural steroid injection. Which was done on 09/11/2012. States major improvement.   She was in the hospital with chest pain from August 6 through August 11. She ruled out for ischemia but had severe aortic stenosis per echo [see below]. Study was worse than the last study of January 2013. She's been to see cardiology the family did not want further aggressive measures such he was also found to have a new 9 x 9 mm lesion of the right middle lobe and a stable 5 mm lesion in the right middle lobe. It was recommended that she have a followup CT scan in 3 months and this has been completed showing almost complete resolution of the RML nodule compatible with a benign process.   has a past medical history of Asthma; Shortness of breath; Hypertension; and Blood transfusion, lumbar spinal stenosis, chronic diastolic heart failure, chronic renal failure stage II, hypothyroidism, on replacement, depression, hypercholesterolemia   has a past medical history of Asthma; Shortness of breath; Hypertension; Blood transfusion; Dementia with behavioral disturbance; Other malaise and fatigue; Esophageal reflux; Essential hypertension, benign; Unspecified diastolic heart failure; Personal history of alcoholism; Hypopotassemia; and Anemia.  Past Surgical History  Procedure Laterality Date  . Colonoscopy  03/08/2011    Procedure: COLONOSCOPY;  Surgeon: Erick Blinks, MD;  Location: [facility] ENDOSCOPY;  Service: Gastroenterology;  Laterality: N/A;  Medication list is reviewed;  Physical exam. Respiratory clear entry bilaterally. Cardiac.2/6 short midsystolic murmer, non  radiating. Neurologic; able to stand however, very ataxic gait. Mostly, propels herself in the facility in a wheelchair.  Impression/plan  #1 lumbar spinal stenosis. Much improved with recent epidural injection. #2 asthma. This has not been unstable. She is on an Advair Diskus. #3 aortic stenosis. Apparently the family does not want a surgical consultation. She does not appear to be overtly symptomatic at the present time. #4 pulmonary nodules; Follow up Ct of the chest shows resolution of the RML nodule

## 2013-02-09 ENCOUNTER — Other Ambulatory Visit: Payer: Self-pay | Admitting: *Deleted

## 2013-02-09 MED ORDER — TEMAZEPAM 7.5 MG PO CAPS: ORAL_CAPSULE | ORAL | Status: DC

## 2013-02-20 ENCOUNTER — Non-Acute Institutional Stay (SKILLED_NURSING_FACILITY): Payer: PRIVATE HEALTH INSURANCE | Admitting: Internal Medicine

## 2013-02-20 DIAGNOSIS — I350 Nonrheumatic aortic (valve) stenosis: Secondary | ICD-10-CM

## 2013-02-20 DIAGNOSIS — I359 Nonrheumatic aortic valve disorder, unspecified: Secondary | ICD-10-CM

## 2013-02-20 DIAGNOSIS — R911 Solitary pulmonary nodule: Secondary | ICD-10-CM

## 2013-02-20 NOTE — Progress Notes (Signed)
Patient ID: [name], female   DOB: 12/18/1929, 79 y.o.   MRN: [id number]  Facility; [facility]. Chief complaint; [facility] monthly visit for November. Followup back pain and AS History; patient has been in the building since February of 2013. Major issue recently has been lumbar spinal stenosis without myelopathy. She has been suggested for an epidural steroid injection. Which was done on 09/11/2012. States major improvement.   She was in the hospital with chest pain from August 6 through August 11. She ruled out for ischemia but had severe aortic stenosis per echo [see below]. Study was worse than the last study of January 2013. She's been to see cardiology the family did not want further aggressive measures such he was also found to have a new 9 x 9 mm lesion of the right middle lobe and a stable 5 mm lesion in the right middle lobe. It was recommended that she have a followup CT scan in 3 months and this has been completed showing almost complete resolution of the RML nodule compatible with a benign process.   has a past medical history of Asthma; Shortness of breath; Hypertension; and Blood transfusion, lumbar spinal stenosis, chronic diastolic heart failure, chronic renal failure stage II, hypothyroidism, on replacement, depression, hypercholesterolemia   has a past medical history of Asthma; Shortness of breath; Hypertension; Blood transfusion; Dementia with behavioral disturbance; Other malaise and fatigue; Esophageal reflux; Essential hypertension, benign; Unspecified diastolic heart failure; Personal history of alcoholism; Hypopotassemia; and Anemia.  Past Surgical History  Procedure Laterality Date  . Colonoscopy  03/08/2011    Procedure: COLONOSCOPY;  Surgeon: Erick Blinks, MD;  Location: [facility] ENDOSCOPY;  Service: Gastroenterology;  Laterality: N/A;  Medication list is reviewed;  Physical exam. Respiratory clear entry bilaterally. Cardiac.2/6 short midsystolic murmer, non  radiating. Neurologic; able to stand however, very ataxic gait. Mostly, propels herself in the facility in a wheelchair.  Impression/plan  #1 lumbar spinal stenosis. Much improved with recent epidural injection. #2 asthma. This has not been unstable. She is on an Advair Diskus. #3 aortic stenosis. Apparently the family does not want a surgical consultation. She does not appear to be overtly symptomatic at the present time. #4 pulmonary nodules; Follow up Ct of the chest shows resolution of the RML nodule. No further imaging is recommended  EXAM: CT CHEST WITH CONTRAST   TECHNIQUE: Multidetector CT imaging of the chest was performed during intravenous contrast administration.   CONTRAST:  80mL OMNIPAQUE IOHEXOL 300 MG/ML  SOLN   COMPARISON:  Chest CT 10/12/2012.   FINDINGS: Mediastinum: Heart size is normal. There is no significant pericardial fluid, thickening or pericardial calcification. There is atherosclerosis of the thoracic aorta, the great vessels of the mediastinum and the coronary arteries, including calcified atherosclerotic plaque in the left main, left anterior descending, left circumflex and right coronary arteries. As with the prior examination there is also extensive atherosclerosis throughout the descending thoracic aorta which is ectatic measuring up to 3.7 cm in diameter at a level posterior to the left atrium. Extensive calcifications of the aortic valve and mitral annulus. No pathologically enlarged mediastinal or hilar lymph nodes. Esophagus is unremarkable in appearance.   Lungs/Pleura: 5 mm ground-glass attenuation nodule in the right upper lobe adjacent to the minor fissure (image 30 of series 3) is completely unchanged compared to prior study from 03/12/2011, and significantly less prominent when compared to more remote prior study 06/14/2008, compatible with a benign nodule. When compared to the most recent prior examination, the previously  described  new 9 mm nodule in knee periphery of the right middle lobe is far less apparent, now only a ill-defined area of nodular scarring measuring only 4 mm (image 31 of series 3), most compatible with a resolving infectious or inflammatory nodule. No other new suspicious appearing pulmonary nodules or masses are otherwise identified. Scarring in the inferior segment of the lingula is unchanged. No acute consolidative airspace disease. Prominent subpleural bulla in the medial aspect of the right middle lobe is unchanged. No pleural effusions.   Upper Abdomen: Visualized portions of the kidneys bilaterally demonstrate multifocal areas of cortical thinning, suggesting areas of scarring from prior infections or infarctions. There are also multiple low-attenuation lesions in the kidneys bilaterally, the majority of which are too small to definitively characterize or are incompletely visualized and therefore not characterized. These are likely to represent small cysts. Numerous colonic diverticulae are noted in the transverse colon, without surrounding inflammatory changes in the visualized portions of the abdomen to suggest an acute diverticulitis at this time. Atherosclerosis.   Musculoskeletal: There are no aggressive appearing lytic or blastic lesions noted in the visualized portions of the skeleton.   IMPRESSION: 1. Near complete resolution of the previously described 9 mm right middle lobe nodule compared to the prior examination, compatible with a benign process; likely an infectious or inflammatory nodule on the prior study. 2. 5 mm ground-glass attenuation nodule in the right upper lobe, similar to recent prior examinations, but significantly with less apparent than remote prior study 06/14/2008. This is compatible with a benign nodule and does not require specific imaging followup. 3. Atherosclerosis, including left main and 3 vessel coronary artery disease. 4. There are  calcifications of the aortic valve and mitral annulus. Echocardiographic correlation for evaluation of potential valvular dysfunction may be warranted if clinically indicated.

## 2013-04-01 ENCOUNTER — Non-Acute Institutional Stay (SKILLED_NURSING_FACILITY): Payer: PRIVATE HEALTH INSURANCE | Admitting: Internal Medicine

## 2013-04-01 DIAGNOSIS — I359 Nonrheumatic aortic valve disorder, unspecified: Secondary | ICD-10-CM

## 2013-04-01 DIAGNOSIS — J45909 Unspecified asthma, uncomplicated: Secondary | ICD-10-CM

## 2013-04-01 DIAGNOSIS — I350 Nonrheumatic aortic (valve) stenosis: Secondary | ICD-10-CM

## 2013-04-01 NOTE — Progress Notes (Signed)
Patient ID: [name] QuakerCarolyn R [name], female   DOB: April 20, 1929, 80 y.o.   MRN: [id number]  Facility; [facility]. Chief complaint; [facility] monthly visit for December. Followup back pain and AS History; patient has been in the building since February of 2013. Major issue recently has been lumbar spinal stenosis without myelopathy. She has been suggested for an epidural steroid injection. Which was done on 09/11/2012. States major improvement.   She was in the hospital with chest pain from August 6 through August 11. She ruled out for ischemia but had severe aortic stenosis per echo [see below]. Study was worse than the last study of January 2013. She's been to see cardiology the family did not want further aggressive measures such he was also found to have a new 9 x 9 mm lesion of the right middle lobe and a stable 5 mm lesion in the right middle lobe. It was recommended that she have a followup CT scan in 3 months and this has been completed showing almost complete resolution of the RML nodule compatible with a benign process.    has a past medical history of Asthma; Shortness of breath; Hypertension; Blood transfusion; Dementia with behavioral disturbance; Other malaise and fatigue; Esophageal reflux; Essential hypertension, benign; Unspecified diastolic heart failure; Personal history of alcoholism; Hypopotassemia; and Anemia.  Past Surgical History  Procedure Laterality Date  . Colonoscopy  03/08/2011    Procedure: COLONOSCOPY;  Surgeon: Erick BlinksJay Pyrtle, MD;  [facility] END[facility]COPY;  Service: Gastroenterology;  Laterality: N/A;  Medication list is reviewed;  Physical exam. Respiratory clear entry bilaterally. Cardiac.2/6 short midsystolic murmer, non radiating. No CHF Neurologic; able to stand however, very ataxic gait. Mostly, propels herself in the facility in a wheelchair.  Impression/plan  #1 lumbar spinal stenosis. Much improved with recent epidural injection. #2 asthma. This has not been  unstable. She is on an Advair Diskus. #3 aortic stenosis. Apparently the family does not want a surgical consultation. She does not appear to be overtly symptomatic at the present time. #4 pulmonary nodules; Follow up Ct of the chest shows resolution of the RML nodule. No further imaging is recomme

## 2013-04-19 ENCOUNTER — Other Ambulatory Visit: Payer: Self-pay | Admitting: *Deleted

## 2013-04-19 MED ORDER — HYDROCODONE-ACETAMINOPHEN 5-325 MG PO TABS: ORAL_TABLET | ORAL | Status: DC

## 2013-04-19 NOTE — Telephone Encounter (Signed)
[facility] 

## 2013-06-27 ENCOUNTER — Other Ambulatory Visit: Payer: Self-pay | Admitting: *Deleted

## 2013-06-27 MED ORDER — TEMAZEPAM 7.5 MG PO CAPS: ORAL_CAPSULE | ORAL | Status: DC

## 2013-06-27 NOTE — Telephone Encounter (Signed)
[facility] 

## 2013-07-04 ENCOUNTER — Non-Acute Institutional Stay (SKILLED_NURSING_FACILITY): Payer: PRIVATE HEALTH INSURANCE | Admitting: Internal Medicine

## 2013-07-04 DIAGNOSIS — F02818 Dementia in other diseases classified elsewhere, unspecified severity, with other behavioral disturbance: Secondary | ICD-10-CM

## 2013-07-04 DIAGNOSIS — I359 Nonrheumatic aortic valve disorder, unspecified: Secondary | ICD-10-CM

## 2013-07-04 DIAGNOSIS — M4716 Other spondylosis with myelopathy, lumbar region: Secondary | ICD-10-CM

## 2013-07-04 DIAGNOSIS — F0281 Dementia in other diseases classified elsewhere with behavioral disturbance: Secondary | ICD-10-CM

## 2013-07-04 DIAGNOSIS — I350 Nonrheumatic aortic (valve) stenosis: Secondary | ICD-10-CM

## 2013-07-04 NOTE — Progress Notes (Signed)
Patient ID: [name] QuakerCarolyn R Vallez, female   DOB: [birth year], 80 y.o.   MRN: [id number]   Facility; [facility]. Chief complaint; [facility] monthly visit for [employer]. Followup back pain and AS History; patient has been in the building since February of 2013. Major issue recently has been lumbar spinal stenosis without myelopathy. She has been suggested for an epidural steroid injection. Which was done on 09/11/2012. States major improvement.   She was in the hospital with chest pain from August 6 through August 11. She ruled out for ischemia but had severe aortic stenosis per echo [see below]. Study was worse than the last study of January 2013. She's been to see cardiology the family did not want further aggressive measures such he was also found to have a new 9 x 9 mm lesion of the right middle lobe and a stable 5 mm lesion in the right middle lobe. It was recommended that she have a followup CT scan in 3 months and this has been completed showing almost complete resolution of the RML nodule compatible with a benign process.  She has dementia with delusional disorder and has been followed by psychiatry in the facility she remains on Namenda and Aricept she is come off her antipsychotics [Risperdal]  Recent lab work has included a CBC that is essentially normal. Comprehensive metabolic panel shows a BUN of 17 a creatinine of 1.1 albumin of 3.3. Lipid panel shows an LDL cholesterol of 75.    has a past medical history of Asthma; Shortness of breath; Hypertension; Blood transfusion; Dementia with behavioral disturbance; Other malaise and fatigue; Esophageal reflux; Essential hypertension, benign; Unspecified diastolic heart failure; Personal history of alcoholism; Hypopotassemia; and Anemia.  Past Surgical History  Procedure Laterality Date  . Colonoscopy  03/08/2011    Procedure: COLONOSCOPY;  Surgeon: Erick BlinksJay Pyrtle, MD;  [facility];  Service: Gastroenterology;  Laterality: N/A;  Medication list  is reviewed;  Physical exam. Temperature 98.1-pulse 62-respirations 16-blood pressure 121/62-weight 200 pounds-O2 sat 90% on room air Respiratory clear entry bilaterally. Cardiac.2/6 short midsystolic murmer, non radiating. No CHF Neurologic; able to stand however, very ataxic gait. Mostly, propels herself in the facility in a wheelchair.  Impression/plan  #1 lumbar spinal stenosis. Much improved with recent epidural injection. #2 asthma. This has not been unstable. She is on an Advair Diskus. #3 aortic stenosis. Apparently the family does not want a surgical consultation. She does not appear to be overtly symptomatic at the present time. #4 pulmonary nodules; Follow up Ct of the chest shows resolution of the RML nodule. No further imaging is recomme #5 dementia with delusional disorder. She is on Namenda and Aricept that. Antipsychotics have been discontinued without significant problems.

## 2013-08-05 ENCOUNTER — Encounter: Payer: Self-pay | Admitting: Internal Medicine

## 2013-08-05 ENCOUNTER — Non-Acute Institutional Stay (SKILLED_NURSING_FACILITY): Payer: PRIVATE HEALTH INSURANCE | Admitting: Internal Medicine

## 2013-08-05 DIAGNOSIS — F02818 Dementia in other diseases classified elsewhere, unspecified severity, with other behavioral disturbance: Secondary | ICD-10-CM

## 2013-08-05 DIAGNOSIS — I359 Nonrheumatic aortic valve disorder, unspecified: Secondary | ICD-10-CM

## 2013-08-05 DIAGNOSIS — J45909 Unspecified asthma, uncomplicated: Secondary | ICD-10-CM

## 2013-08-05 DIAGNOSIS — F0281 Dementia in other diseases classified elsewhere with behavioral disturbance: Secondary | ICD-10-CM

## 2013-08-05 DIAGNOSIS — I350 Nonrheumatic aortic (valve) stenosis: Secondary | ICD-10-CM

## 2013-08-05 NOTE — Progress Notes (Signed)
This encounter was created in error - please disregard.

## 2013-08-05 NOTE — Progress Notes (Signed)
Patient ID: [name], female   DOB: 1929-05-06, 80 y.o.   MRN: [id number]    Facility; [facility]. Chief complaint; [facility] monthly visit for April. Followup back pain and AS History; patient has been in the building since February of 2013. Major issue recently has been lumbar spinal stenosis without myelopathy. She has been suggested for an epidural steroid injection. Which was done on 09/11/2012. States major improvement.  She was in the hospital with chest pain from August 6 through August 11 /2014. She ruled out for ischemia but had severe aortic stenosis per echo [see below]. Study was worse than the last study of January 2013. She's been to see cardiology the family did not want further aggressive measures such he was also found to have a new 9 x 9 mm lesion of the right middle lobe and a stable 5 mm lesion in the right middle lobe. It was recommended that she have a followup CT scan in 3 months and this has been completed showing almost complete resolution of the RML nodule compatible with a benign process.  She has dementia with delusional disorder and has been followed by psychiatry in the facility she remains on Namenda and Aricept she is come off her antipsychotics [Risperdal]  Recent lab work has included a CBC that is essentially normal. Comprehensive metabolic panel shows a BUN of 17 a creatinine of 1.1 albumin of 3.3. Lipid panel shows an LDL cholesterol of 75.    has a past medical history of Asthma; Shortness of breath; Hypertension; Blood transfusion; Dementia with behavioral disturbance; Other malaise and fatigue; Esophageal reflux; Essential hypertension, benign; Unspecified diastolic heart failure; Personal history of alcoholism; Hypopotassemia; and Anemia.  Past Surgical History  Procedure Laterality Date  . Colonoscopy  03/08/2011    Procedure: COLONOSCOPY;  Surgeon: Erick Blinks, MD;  Location: [facility] ENDOSCOPY;  Service: Gastroenterology;  Laterality: N/A;   Medication list is reviewed;  Physical exam. Temperature 98.1-pulse 62-respirations 16-blood pressure 121/62-weight 200 pounds-O2 sat 90% on room air Respiratory clear entry bilaterally. Cardiac.2/6 short midsystolic murmer, non radiating. No CHF Neurologic; able to stand however, very ataxic gait. Mostly, propels herself in the facility in a wheelchair.  Impression/plan  #1 lumbar spinal stenosis. No recent concerns #2 asthma. This has not been unstable. She is on an Advair Diskus. #3 aortic stenosis. Apparently the family does not want a surgical consultation. She does not appear to be overtly symptomatic at the present time. #4 pulmonary nodules; Follow up Ct of the chest shows resolution of the RML nodule. No further imaging is recomme #5 dementia with delusional disorder. She is on Namenda and Aricept that. Antipsychotics have been discontinued without significant problems.

## 2013-08-21 ENCOUNTER — Non-Acute Institutional Stay (SKILLED_NURSING_FACILITY): Payer: PRIVATE HEALTH INSURANCE | Admitting: Internal Medicine

## 2013-08-21 DIAGNOSIS — J45909 Unspecified asthma, uncomplicated: Secondary | ICD-10-CM

## 2013-08-21 DIAGNOSIS — I359 Nonrheumatic aortic valve disorder, unspecified: Secondary | ICD-10-CM

## 2013-08-21 DIAGNOSIS — I350 Nonrheumatic aortic (valve) stenosis: Secondary | ICD-10-CM

## 2013-08-21 DIAGNOSIS — E039 Hypothyroidism, unspecified: Secondary | ICD-10-CM

## 2013-08-28 NOTE — Progress Notes (Addendum)
Patient ID: [name], female   DOB: 1929-06-18, 80 y.o.   MRN: [id number]                 PROGRESS NOTE  DATE:  08/21/2013     FACILITY: [facility]    LEVEL OF CARE:   SNF   Routine Visit   CHIEF COMPLAINT:  [facility] visit for May.    HISTORY OF PRESENT ILLNESS:  This is a patient who has been in the building since February 2013.  She has not had any acute issues.   No falls.  Her vital signs are stable.    PAST MEDICAL HISTORY/PROBLEM LIST:  Severe aortic stenosis.    Lumbar spinal stenosis.    Dementia with a history of delusions, depression and anxiety.  On Namenda and Aricept.    She is on Effexor XR 150 mg for her depression.    History of COPD.  This is asymptomatic.    Protein calorie malnutrition.  Albumin slightly low at 3.4.    History of esophageal reflux.    Hypothyroidism.  On replacement.     Hyperlipidemia.  On Crestor.      Hypertension.    Chronic diastolic heart failure.   Not currently on any diuretic.    LABORATORY DATA:  Recent lab work showed:    Microalbumin/creatinine ratio 7.3, which is acceptable.    TSH 1.484.    25-hydroxy vitamin D 26.    Last BUN and creatinine were 17 and 1.1, respectively, on 05/16/2013.    CBC was reasonably normal.    CURRENT MEDICATIONS:  Medication list is reviewed.     Synthroid 100 mg daily.    Prilosec 40 q.d.    MiraLAX 17 g daily.    Crestor 5 q.d.     VESIcare 10 q.d.    Effexor 150 q.d.    Vitamin D3, 50,000 U monthly.    Advair 250/50, 1 puff every 12 hours.    Lopressor 25 b.i.d.    Namenda 10 mg by mouth twice daily.    Aricept 10 q.d.      PHYSICAL EXAMINATION:   VITAL SIGNS:   TEMPERATURE:  98.   PULSE:  66.   RESPIRATIONS:  18.   BLOOD PRESSURE:  126/74.   WEIGHT:  198 pounds.   O2 SATURATIONS:   90% on room air.   CHEST/RESPIRATORY:  Clear air entry bilaterally.   CARDIOVASCULAR:  CARDIAC:  There is a 2/6 short midsystolic murmur, not radiating.   No CHF.      GASTROINTESTINAL:  LIVER/SPLEEN/KIDNEYS:  No liver, no spleen.  No tenderness was noted.    ASSESSMENT/PLAN:  Aortic stenosis.  This was listed as severe.  The family does not want surgical consultation and, therefore, her follow-up has been limited.    COPD/asthma.  She is on Advair Diskus.  This has not been unstable.    Lumbar spinal stenosis.   This is not a current concern.    Hypothyroidism.  On replacement.      Hyperlipidemia.  On Crestor.

## 2013-09-30 ENCOUNTER — Non-Acute Institutional Stay (SKILLED_NURSING_FACILITY): Payer: PRIVATE HEALTH INSURANCE | Admitting: Internal Medicine

## 2013-09-30 DIAGNOSIS — F0281 Dementia in other diseases classified elsewhere with behavioral disturbance: Secondary | ICD-10-CM

## 2013-09-30 DIAGNOSIS — I359 Nonrheumatic aortic valve disorder, unspecified: Secondary | ICD-10-CM

## 2013-09-30 DIAGNOSIS — F02818 Dementia in other diseases classified elsewhere, unspecified severity, with other behavioral disturbance: Secondary | ICD-10-CM

## 2013-09-30 DIAGNOSIS — I350 Nonrheumatic aortic (valve) stenosis: Secondary | ICD-10-CM

## 2013-09-30 NOTE — Progress Notes (Signed)
Patient ID: [name] QuakerCarolyn R Kassner, female   DOB: [birth year], 80 y.o.   MRN: [id number]                  PROGRESS NOTE  DATE:  09/30/2013   FACILITY: [facility]    LEVEL OF CARE:   SNF   Routine Visit   CHIEF COMPLAINT:  [facility] visit for june.    HISTORY OF PRESENT ILLNESS:  This is a patient who has been in the building since February 2013.  She has not had any acute issues.   No falls.  Her vital signs are stable.    PAST MEDICAL HISTORY/PROBLEM LIST:  Severe aortic stenosis.    Lumbar spinal stenosis.    Dementia with a history of delusions, depression and anxiety.  On Namenda and Aricept.    She is on Effexor XR 150 mg for her depression.    History of COPD.  This is asymptomatic.    Protein calorie malnutrition.  Albumin slightly low at 3.4.    History of esophageal reflux.    Hypothyroidism.  On replacement.     Hyperlipidemia.  On Crestor.      Hypertension.    Chronic diastolic heart failure.   Not currently on any diuretic.    LABORATORY DATA:  Recent lab work showed:    Microalbumin/creatinine ratio 7.3, which is acceptable.    TSH 1.484.    25-hydroxy vitamin D 26.    Last BUN and creatinine were 17 and 1.1, respectively, on 05/16/2013.    CBC was reasonably normal.    CURRENT MEDICATIONS:  Medication list is reviewed.     Synthroid 100 mg daily.    Prilosec 40 q.d.    MiraLAX 17 g daily.    Crestor 5 q.d.     VESIcare 10 q.d.    Effexor 150 q.d.    Vitamin D3, 50,000 U monthly.    Advair 250/50, 1 puff every 12 hours.    Lopressor 25 b.i.d.    Namenda 10 mg by mouth twice daily.    Aricept 10 q.d.      PHYSICAL EXAMINATION:   VITAL SIGNS:   TEMPERATURE:  98.   PULSE:  66.   RESPIRATIONS:  18.   BLOOD PRESSURE:  126/74.   WEIGHT:  198 pounds.   O2 SATURATIONS:   90% on room air.   CHEST/RESPIRATORY:  Clear air entry bilaterally.   CARDIOVASCULAR:  CARDIAC:  There is a 2/6 short midsystolic murmur, not radiating.   No CHF.      GASTROINTESTINAL:  LIVER/SPLEEN/KIDNEYS:  No liver, no spleen.  No tenderness was noted.    ASSESSMENT/PLAN:  Aortic stenosis.  This was listed as severe.  The family does not want surgical consultation and, therefore, her follow-up has been limited.    COPD/asthma.  She is on Advair Diskus.  This has not been unstable.    Lumbar spinal stenosis.   This is not a current concern.    Hypothyroidism.  On replacement.      Hyperlipidemia.  On Crestor.    Patient appears to be very stable no additional tests or orders

## 2013-12-04 ENCOUNTER — Non-Acute Institutional Stay (SKILLED_NURSING_FACILITY): Payer: PRIVATE HEALTH INSURANCE | Admitting: Internal Medicine

## 2013-12-04 DIAGNOSIS — I350 Nonrheumatic aortic (valve) stenosis: Secondary | ICD-10-CM

## 2013-12-04 DIAGNOSIS — J45909 Unspecified asthma, uncomplicated: Secondary | ICD-10-CM

## 2013-12-04 DIAGNOSIS — I359 Nonrheumatic aortic valve disorder, unspecified: Secondary | ICD-10-CM

## 2013-12-04 NOTE — Progress Notes (Signed)
Patient ID: [name], female   DOB: 1929-10-01, 80 y.o.   MRN: [id number]                  PROGRESS NOTE  DATE:  11/27/2013   FACILITY: [facility]    LEVEL OF CARE:   SNF   Routine Visit   CHIEF COMPLAINT:  Review of general medical problems/[facility] visit  HISTORY OF PRESENT ILLNESS:  This is a patient who has been in the building since February 2013.  She has not had any acute issues.   No falls.  Her vital signs are stable.    PAST MEDICAL HISTORY/PROBLEM LIST:  Severe aortic stenosis.    Lumbar spinal stenosis.    Dementia with a history of delusions, depression and anxiety.  On Namenda and Aricept.    She is on Effexor XR 150 mg for her depression.    History of COPD.  This is asymptomatic.    Protein calorie malnutrition.  Albumin slightly low at 3.4.    History of esophageal reflux.    Hypothyroidism.  On replacement.     Hyperlipidemia.  On Crestor.      Hypertension.    Chronic diastolic heart failure.   Not currently on any diuretic.    LABORATORY DATA:   CBC 9/4 white count 7.8 hemoglobin 13.5 platelet count 316 Comprehensive metabolic panel sodium 139 potassium 4.2 BUN 18 creatinine 112 liver function tests normal albumin 3.5 hemoglobin A1c 6.7 back it's not on any current treatment]  CURRENT MEDICATIONS:  Medication list is reviewed.     Synthroid 100 mg daily.    Prilosec 40 q.d.    MiraLAX 17 g daily.    Crestor 5 q.d.     VESIcare 10 q.d.    Effexor 150 q.d.    Vitamin D3, 50,000 U monthly.    Advair 250/50, 1 puff every 12 hours.    Lopressor 25 b.i.d.    Namenda 10 mg by mouth twice daily.    Aricept 10 q.d.      PHYSICAL EXAMINATION:   VITAL SIGNS:   TEMPERATURE:  98.   PULSE:  62.   RESPIRATIONS:  18.   BLOOD PRESSURE:  126/74.   WEIGHT:  208 pounds.   O2 SATURATIONS:   90% on room air.   CHEST/RESPIRATORY:  Clear air entry bilaterally.   CARDIOVASCULAR:  CARDIAC:  There is a 2/6 short midsystolic murmur, not  radiating.   No CHF.    GASTROINTESTINAL:  LIVER/SPLEEN/KIDNEYS:  No liver, no spleen.  No tenderness was noted.    ASSESSMENT/PLAN:  Aortic stenosis.  This was listed as severe.  The family does not want surgical consultation and, therefore, her follow-up has been limited.    COPD/asthma.  She is on Advair Diskus.  This has not been unstable.    Lumbar spinal stenosis.   This is not a current concern.    Hypothyroidism.  On replacement.      Hyperlipidemia.  On Crestor.    Patient appears to be very stable no additional tests or orders

## 2014-01-07 ENCOUNTER — Other Ambulatory Visit: Payer: Self-pay | Admitting: *Deleted

## 2014-01-07 MED ORDER — TEMAZEPAM 7.5 MG PO CAPS: ORAL_CAPSULE | ORAL | Status: DC

## 2014-01-07 NOTE — Telephone Encounter (Signed)
[facility] 

## 2014-01-08 ENCOUNTER — Other Ambulatory Visit: Payer: Self-pay | Admitting: *Deleted

## 2014-01-08 MED ORDER — TEMAZEPAM 7.5 MG PO CAPS: ORAL_CAPSULE | ORAL | Status: DC

## 2014-01-08 NOTE — Telephone Encounter (Signed)
[facility] 

## 2014-02-19 ENCOUNTER — Non-Acute Institutional Stay (SKILLED_NURSING_FACILITY): Payer: PRIVATE HEALTH INSURANCE | Admitting: Internal Medicine

## 2014-02-19 DIAGNOSIS — I350 Nonrheumatic aortic (valve) stenosis: Secondary | ICD-10-CM

## 2014-02-21 NOTE — Progress Notes (Addendum)
Patient ID: [name], female   DOB: 1929/09/10, 80 y.o.   MRN: [id number]              PROGRESS NOTE  DATE:  02/19/2014    FACILITY: [facility]    LEVEL OF CARE:   SNF   Routine Visit   CHIEF COMPLAINT:  Review of general medical problems/[facility] visit.    HISTORY OF PRESENT ILLNESS:  This is a patient who has been in the building since February 2013.     She has not had any recent medical issues.  Nurses report no acute issues and the patient does not have any obvious complaints.    PAST MEDICAL HISTORY/PROBLEM LIST:   Includes:    Severe aortic stenosis, for which the family did not want anything done.    Lumbar spinal stenosis.     Dementia with a history of delusions.    Depression and anxiety.    History of COPD.    Protein calorie malnutrition.     History of gastroesophageal reflux.    Hypothyroidism.    Hyperlipidemia.    Hypertension.    Chronic diastolic heart failure.    LABORATORY DATA:  Most recent lab work from 11/19/2013:    CBC:  White count 7.8, hemoglobin 13.5, platelets 316.    Comprehensive metabolic panel:  Sodium 139, potassium 4.2, BUN 18, creatinine 1.12.    Hemoglobin A1c was 6.7.      CURRENT MEDICATIONS:  Medication list is reviewed.     Synthroid 100 mcg daily.    Prilosec 40 q.d.      MiraLAX 17 g q.d.    Crestor 5 q.d.    VESIcare 10 mg daily.    Effexor XR 150 daily.    Milk of Magnesia suspension 30 cc daily.    Advair Diskus 250/50 b.i.d.       Lopressor 25 b.i.d.    Senokot-S 1 tablet twice daily.     Namenda 10 mg b.i.d.    Aricept 10 q.d.    Vitamin D3, 50,000 U monthly.     ADVANCED DIRECTIVES:  She has recently been made a DNR.  This includes Do Not Intubate.      PHYSICAL EXAMINATION:   VITAL SIGNS:   TEMPERATURE:  98.8.   PULSE:  62.   RESPIRATIONS:  20.   BLOOD PRESSURE:  130/64.   WEIGHT:  208 pounds.   O2 SATURATIONS:  94% on room air.   CHEST/RESPIRATORY:  Shallow, but  otherwise clear air entry.   CARDIOVASCULAR:  CARDIAC:  Harsh 3/6 systolic ejection murmur at the aortic area.  This does not radiate.   ASSESSMENT/PLAN:                          Aortic stenosis.  This is severe.  However, she has no evidence of heart failure.    Dementia.  This is severe, but not preterminal.    I will change her to represent a DNR.

## 2014-04-02 ENCOUNTER — Non-Acute Institutional Stay (SKILLED_NURSING_FACILITY): Payer: Medicare Other | Admitting: Internal Medicine

## 2014-04-02 DIAGNOSIS — E038 Other specified hypothyroidism: Secondary | ICD-10-CM

## 2014-04-02 DIAGNOSIS — I350 Nonrheumatic aortic (valve) stenosis: Secondary | ICD-10-CM

## 2014-04-02 DIAGNOSIS — K219 Gastro-esophageal reflux disease without esophagitis: Secondary | ICD-10-CM

## 2014-04-05 NOTE — Progress Notes (Addendum)
Patient ID: [name], female   DOB: 08/08/1929, 81 y.o.   MRN: [id number]                PROGRESS NOTE  DATE:  04/02/2014              FACILITY: [facility]                    LEVEL OF CARE:   SNF   Routine Visit                             CHIEF COMPLAINT:  Review of general medical problems/[facility] visit.     HISTORY OF PRESENT ILLNESS:  This is a patient who has been in the building since February 2013.    She generally has been very stable over the last several months.    She has been noted to be eating fairly well.    PAST MEDICAL HISTORY/PROBLEM LIST:                       Severe aortic stenosis, for which nothing further was desired by the patient or her family.    Lumbar spinal stenosis.     Dementia with a history of depression, anxiety, and delusions.    History of COPD.    Protein calorie malnutrition.    History of gastroesophageal reflux.    Hypothyroidism.    Hyperlipidemia.    Hypertension.    Chronic diastolic heart failure.    CURRENT MEDICATIONS:  Medication list is reviewed.                          Synthroid 100 mcg daily.    Prilosec 40 mg daily.    MiraLAX 17 g daily.    Crestor 5 mg daily.    VESIcare 10 mg daily.    Effexor 150 daily.    Advair 250/50 q.12.    Lopressor 25 b.i.d.      Senokot 2 tablets twice daily.    Namenda 10 mg twice daily.    Systane ophthalmic three times a day.    Aricept 10 q.d.      Vitamin D3, 50,000 U monthly.    PHYSICAL EXAMINATION:   VITAL SIGNS:   TEMPERATURE:  98.8.   RESPIRATIONS:    20.    PULSE:   58.   BLOOD PRESSURE:  130/64.   CHEST/RESPIRATORY:  Clear air entry bilaterally.    CARDIOVASCULAR:   CARDIAC:  Harsh systolic murmur.  However, no evidence of heart failure.      GASTROINTESTINAL:   ABDOMEN:  Soft.  No tenderness.  No masses.    ASSESSMENT/PLAN:                                   Hypothyroidism.  Controlled.  Last TSH was 1.484 on 08/13/2013.     Gastroesophageal reflux disease.  On Prilosec.    Aortic stenosis.  Listed as severe, but currently asymptomatic.    Dementia without behaviors.  She has a mini-mental status score of 8, yet remains relatively high-functioning.   This would suggest that this is something else other than Alzheimer's disease.

## 2014-04-30 ENCOUNTER — Non-Acute Institutional Stay (SKILLED_NURSING_FACILITY): Payer: Medicare Other | Admitting: Internal Medicine

## 2014-04-30 DIAGNOSIS — K219 Gastro-esophageal reflux disease without esophagitis: Secondary | ICD-10-CM | POA: Diagnosis not present

## 2014-04-30 DIAGNOSIS — I350 Nonrheumatic aortic (valve) stenosis: Secondary | ICD-10-CM | POA: Diagnosis not present

## 2014-04-30 DIAGNOSIS — E038 Other specified hypothyroidism: Secondary | ICD-10-CM | POA: Diagnosis not present

## 2014-05-03 NOTE — Progress Notes (Signed)
Patient ID: [name], female   DOB: May 31, 1929, 81 y.o.   MRN: [id number]              PROGRESS NOTE  DATE:  04/30/2014              FACILITY: [facility]        LEVEL OF CARE:   SNF   Routine Visit   CHIEF COMPLAINT:  Review of general medical problems/[facility] visit.        HISTORY OF PRESENT ILLNESS:  This is a patient who has been in the building since February 2013.    She really has had no major medical issues.  Most of the time, I see her in the hall in the wheelchair.  Everything, according to the nurses, has been stable.  Mental and functional status are baseline.    PAST MEDICAL HISTORY/PROBLEM LIST:                        Severe aortic stenosis.     Lumbar spinal stenosis.      Dementia with a history of depression and delusions.    History of COPD.      History of gastroesophageal reflux.       Hypothyroidism.  On replacement.       Hyperlipidemia.    Hypertension.     Chronic diastolic heart failure.     CURRENT MEDICATIONS:                          Synthroid 100 mg daily.       Prilosec 40 q.d.        MiraLAX 17 g daily.        Crestor 5 daily.     VESIcare 10 mg daily.      Effexor 150 q.d.       Milk of Magnesia 30 mL daily.        Advair 250/50, 1 puff q.12.       Lopressor 25 twice daily.       Senokot 1 tablet b.i.d.        Namenda 10 b.i.d.        Systane ophthalmic 1 drop into each eye three times a day.      Aricept 10 q.d.       Restoril 7.5 q.h.s.         Vitamin D3, 50,000 U daily.        REVIEW OF SYSTEMS:                             CHEST/RESPIRATORY:  The patient does not complain of shortness of breath.     CARDIAC:   No chest pain.  No palpitations.    She has had no syncope.    PHYSICAL EXAMINATION:                VITAL SIGNS:   BLOOD PRESSURE:  125/78.     PULSE:  72.   O2 SATURATIONS:  92% on room air.     CHEST/RESPIRATORY:  Exam is clear.       CARDIOVASCULAR:  CARDIAC:  2/6 midsystolic ejection  murmur.  However, there is no evidence of heart failure.    EDEMA/VARICOSITIES:  No edema.   GASTROINTESTINAL:  ABDOMEN:   Soft.  No masses.       ASSESSMENT/PLAN:  Hypothyroidism.  On replacement.                        Gastroesophageal reflux disease.  On Prilosec.     Aortic stenosis.  Listed as severe, but currently asymptomatic.    Dementia with behaviors.  She has a listed mini-mental status score of 8, yet she remains much higher functioning than this.   I would think this suggests something else other than Alzheimer's disease.      CPT CODE: 16109                                               ADDENDUM:  Last echocardiogram that I see was from 2014.   This showed severe aortic stenosis with a valve area of 0.69 sq cm.  The family apparently did not wish any interventions at that time.

## 2014-06-18 ENCOUNTER — Encounter: Payer: Self-pay | Admitting: Internal Medicine

## 2014-06-23 NOTE — Progress Notes (Addendum)
Patient ID: [name], female   DOB: 16-Feb-1930, 81 y.o.   MRN: [id number]                PROGRESS NOTE  DATE:  06/18/2014          FACILITY: [facility]                    LEVEL OF CARE:   SNF   Routine Visit                          CHIEF COMPLAINT:  Routine visit/[facility] visit.      HISTORY OF PRESENT ILLNESS:  This is a patient who has been in the building since February 2013.    She really has no major medical issues.  She is up in the wheelchair in the hall.    Her big issue is that of severe aortic stenosis for which she refused any aggressive evaluations or treatment.   In spite of this, she has been reasonably asymptomatic within the limited exertional issues she does.      PAST MEDICAL HISTORY/PROBLEM LIST:                Severe aortic stenosis, with last echocardiogram in [facility] showing a valve area of 0.69 sq cm.     Lumbar spinal stenosis.     History of dementia with depression.    History of COPD.    Gastroesophageal reflux.    Hypothyroidism.  On replacement.     Hyperlipidemia.     Hypertension.    Chronic diastolic heart failure.    CURRENT MEDICATIONS:  Medication list is reviewed.               Synthroid 100 daily.    Prilosec 40 q.d.       MiraLAX daily.    Crestor 5 q.d.       VesiCARE 10 q.d.       Effexor 150 q.d.      Milk of Magnesia p.r.n.       Advair 250/50 q.12.     Lopressor 25 twice daily.      Senokot 1 tablet daily.      Namenda 10 mg daily.      Systane ophthalmic.    Aricept 10 q.d.       Restoril 7.5 q.h.s. routinely.    Vitamin D3, 50,000 U monthly.      LABORATORY DATA:   Lab work last checked on 05/08/2014:    Essentially normal CBC with a white count of 6.4, hemoglobin of 14.6, platelet count of 283.     Normal comprehensive metabolic panel except for an albumin of 3.4 and a creatinine of 1.23.    Fasting lipid panel showed an LDL of 69.    PHYSICAL EXAMINATION:   VITAL SIGNS:     O2 SATURATIONS:  92% on room air.   RESPIRATIONS:    18.    PULSE:     60.     WEIGHT:   204 pounds, which seems to be stable in looking back through the records.    GENERAL APPEARANCE:  The patient, again, greets me in a warm and friendly fashion.   CHEST/RESPIRATORY:  Shallow air entry.  No cough.  Mild expiratory wheeze.       CARDIOVASCULAR:   CARDIAC:  There is a very soft systolic ejection murmur.  No S3.  Her JVP is not elevated.  GASTROINTESTINAL:   ABDOMEN:  Obese.  No masses are noted.    ASSESSMENT/PLAN:                 Aortic stenosis.  Listed as severe.  However, she is currently asymptomatic.    Gastroesophageal reflux disease.  On Prilosec.  This is also asymptomatic.    Hypertension with stage I chronic renal failure.  This is largely stable.  She has microalbumin:creatinine ratios which are well within the normal range.    Hypothyroidism.   On replacement.  I do not see a recent TSH.  However, this may be in the facility EHR.

## 2014-06-24 ENCOUNTER — Encounter: Payer: Self-pay | Admitting: Internal Medicine

## 2014-06-24 ENCOUNTER — Non-Acute Institutional Stay (SKILLED_NURSING_FACILITY): Payer: Medicare Other | Admitting: Internal Medicine

## 2014-06-24 DIAGNOSIS — K219 Gastro-esophageal reflux disease without esophagitis: Secondary | ICD-10-CM | POA: Diagnosis not present

## 2014-06-24 DIAGNOSIS — I350 Nonrheumatic aortic (valve) stenosis: Secondary | ICD-10-CM | POA: Diagnosis not present

## 2014-06-24 NOTE — Progress Notes (Signed)
Patient ID: [name], female   DOB: 1929/03/26, 81 y.o.   MRN: [id number]                PROGRESS NOTE  DATE:  06/18/2014          FACILITY: [facility]                    LEVEL OF CARE:   SNF   Routine Visit                          CHIEF COMPLAINT:  Routine visit/[facility] visit.      HISTORY OF PRESENT ILLNESS:  This is a patient who has been in the building since February 2013.    She really has no major medical issues.  She is up in the wheelchair in the hall.    Her big issue is that of severe aortic stenosis for which she refused any aggressive evaluations or treatment.   In spite of this, she has been reasonably asymptomatic within the limited exertional issues she does.      PAST MEDICAL HISTORY/PROBLEM LIST:                Severe aortic stenosis, with last echocardiogram in [facility] showing a valve area of 0.69 sq cm.     Lumbar spinal stenosis.     History of dementia with depression.    History of COPD.    Gastroesophageal reflux.    Hypothyroidism.  On replacement.     Hyperlipidemia.     Hypertension.    Chronic diastolic heart failure.    CURRENT MEDICATIONS:  Medication list is reviewed.               Synthroid 100 daily.    Prilosec 40 q.d.       MiraLAX daily.    Crestor 5 q.d.       VesiCARE 10 q.d.       Effexor 150 q.d.      Milk of Magnesia p.r.n.       Advair 250/50 q.12.     Lopressor 25 twice daily.      Senokot 1 tablet daily.      Namenda 10 mg daily.      Systane ophthalmic.    Aricept 10 q.d.       Restoril 7.5 q.h.s. routinely.    Vitamin D3, 50,000 U monthly.      LABORATORY DATA:   Lab work last checked on 05/08/2014:    Essentially normal CBC with a white count of 6.4, hemoglobin of 14.6, platelet count of 283.     Normal comprehensive metabolic panel except for an albumin of 3.4 and a creatinine of 1.23.    Fasting lipid panel showed an LDL of 69.    PHYSICAL EXAMINATION:   VITAL SIGNS:     O2 SATURATIONS:  92% on room air.   RESPIRATIONS:    18.    PULSE:     60.     WEIGHT:   204 pounds, which seems to be stable in looking back through the records.    GENERAL APPEARANCE:  The patient, again, greets me in a warm and friendly fashion.   CHEST/RESPIRATORY:  Shallow air entry.  No cough.  Mild expiratory wheeze.       CARDIOVASCULAR:   CARDIAC:  There is a very soft systolic ejection murmur.  No S3.  Her JVP is not elevated.  GASTROINTESTINAL:   ABDOMEN:  Obese.  No masses are noted.    ASSESSMENT/PLAN:                 Aortic stenosis.  Listed as severe.  However, she is currently asymptomatic.    Gastroesophageal reflux disease.  On Prilosec.  This is also asymptomatic.    Hypertension with stage I chronic renal failure.  This is largely stable.  She has microalbumin:creatinine ratios which are well within the normal range.    Hypothyroidism.   On replacement.  I do not see a recent TSH.  However, this may be in the facility EHR.                        ++ This encounter was created in error - please disregard.

## 2014-06-24 NOTE — Progress Notes (Signed)
Patient ID: [name], female   DOB: 1929/08/19, 81 y.o.   MRN: [id number]               PROGRESS NOTE  DATE:  06/18/2014          FACILITY: [facility]                    LEVEL OF CARE:   SNF   Routine Visit                          CHIEF COMPLAINT:  Routine visit/[facility] visit.      HISTORY OF PRESENT ILLNESS:  This is a patient who has been in the building since February 2013.    She really has no major medical issues.  She is up in the wheelchair in the hall.    Her big issue is that of severe aortic stenosis for which she refused any aggressive evaluations or treatment.   In spite of this, she has been reasonably asymptomatic within the limited exertional issues she does.      PAST MEDICAL HISTORY/PROBLEM LIST:                Severe aortic stenosis, with last echocardiogram in [facility] showing a valve area of 0.69 sq cm.     Lumbar spinal stenosis.     History of dementia with depression.    History of COPD.    Gastroesophageal reflux.    Hypothyroidism.  On replacement.     Hyperlipidemia.     Hypertension.    Chronic diastolic heart failure.    CURRENT MEDICATIONS:  Medication list is reviewed.               Synthroid 100 daily.    Prilosec 40 q.d.       MiraLAX daily.    Crestor 5 q.d.       VesiCARE 10 q.d.       Effexor 150 q.d.      Milk of Magnesia p.r.n.       Advair 250/50 q.12.     Lopressor 25 twice daily.      Senokot 1 tablet daily.      Namenda 10 mg daily.      Systane ophthalmic.    Aricept 10 q.d.       Restoril 7.5 q.h.s. routinely.    Vitamin D3, 50,000 U monthly.      LABORATORY DATA:   Lab work last checked on 05/08/2014:    Essentially normal CBC with a white count of 6.4, hemoglobin of 14.6, platelet count of 283.     Normal comprehensive metabolic panel except for an albumin of 3.4 and a creatinine of 1.23.    Fasting lipid panel showed an LDL of 69.    PHYSICAL EXAMINATION:   VITAL SIGNS:     O2 SATURATIONS:  92% on room air.   RESPIRATIONS:    18.    PULSE:     60.     WEIGHT:   204 pounds, which seems to be stable in looking back through the records.    GENERAL APPEARANCE:  The patient, again, greets me in a warm and friendly fashion.   CHEST/RESPIRATORY:  Shallow air entry.  No cough.  Mild expiratory wheeze.       CARDIOVASCULAR:   CARDIAC:  There is a very soft systolic ejection murmur.  No S3.  Her JVP is not elevated.  GASTROINTESTINAL:   ABDOMEN:  Obese.  No masses are noted.    ASSESSMENT/PLAN:                 Aortic stenosis.  Listed as severe.  However, she is currently asymptomatic.    Gastroesophageal reflux disease.  On Prilosec.  This is also asymptomatic.    Hypertension with stage I chronic renal failure.  This is largely stable.  She has microalbumin:creatinine ratios which are well within the normal range.    Hypothyroidism.   On replacement.  I do not see a recent TSH.  However, this may be in the facility EHR.                        ++

## 2014-06-24 NOTE — Progress Notes (Signed)
Progress Notes      [name] Caul, MD at 06/23/2014 6:10 PM     Status: Addendum        Patient ID: [name], female DOB: [birth year], 81 y.o. MRN: [id number]        PROGRESS NOTE  DATE:06/18/2014   FACILITY:[facility]   LEVEL OF CARE: SNF   Routine Visit    CHIEF COMPLAINT: Routine visit/[facility] visit.   HISTORY OF PRESENT ILLNESS: This is a patient who has been in the building since February 2013.   She really has no major medical issues. She is up in the wheelchair in the hall.   Her big issue is that of severe aortic stenosis for which she refused any aggressive evaluations or treatment. In spite of this, she has been reasonably asymptomatic within the limited exertional issues she does.   PAST MEDICAL HISTORY/PROBLEM LIST:   Severe aortic stenosis, with last echocardiogram in [facility] showing a valve area of 0.69 sq cm.   Lumbar spinal stenosis.   History of dementia with depression.   History of COPD.   Gastroesophageal reflux.   Hypothyroidism. On replacement.   Hyperlipidemia.   Hypertension.   Chronic diastolic heart failure.  CURRENT MEDICATIONS: Medication list is reviewed.   Synthroid 100 daily.   Prilosec 40 q.d.   MiraLAX daily.   Crestor 5 q.d.   VesiCARE 10 q.d.   Effexor 150 q.d.   Milk of Magnesia p.r.n.   Advair 250/50 q.12.   Lopressor 25 twice daily.   Senokot 1 tablet daily.   Namenda 10 mg daily.   Systane ophthalmic.   Aricept 10 q.d.   Restoril 7.5 q.h.s. routinely.   Vitamin D3, 50,000 U monthly.  LABORATORY DATA: Lab work last checked on 05/08/2014:   Essentially normal CBC with a white count of 6.4,  hemoglobin of 14.6, platelet count of 283.   Normal comprehensive metabolic panel except for an albumin of 3.4 and a creatinine of 1.23.   Fasting lipid panel showed an LDL of 69.   PHYSICAL EXAMINATION:  VITAL SIGNS:  O2 SATURATIONS: 92% on room air.  RESPIRATIONS: 18.  PULSE: 60.  WEIGHT: 204 pounds, which seems to be stable in looking back through the records.  GENERAL APPEARANCE: The patient, again, greets me in a warm and friendly fashion.  CHEST/RESPIRATORY: Shallow air entry. No cough. Mild expiratory wheeze.  CARDIOVASCULAR:  CARDIAC: There is a very soft systolic ejection murmur. No S3. Her JVP is not elevated.  GASTROINTESTINAL:  ABDOMEN: Obese. No masses are noted.   ASSESSMENT/PLAN:   Aortic stenosis. Listed as severe. However, she is currently asymptomatic.   Gastroesophageal reflux disease. On Prilosec. This is also asymptomatic.   Hypertension with stage I chronic renal failure. This is largely stable. She has microalbumin:creatinine ratios which are well within the normal range.   Hypothyroidism. On replacement. I do not see a recent TSH. However, this may be in the facility EHR.            Revision History       Date/Time User Action    > 06/24/2014 4:27 AM [name] Caul, MD Addend     06/24/2014 4:27 AM [name] Caul, MD Sign     06/23/2014 6:12 PM [name] Share               Psych Notes     No notes of this type exist for this encounter.     [facility] Patient Outreach  No notes of this type exist for this encounter.     Not recorded           All Flowsheet Templates (all recorded)     Nursing Home Patient Info     Routing History     There are no sent or routed communications associated with this encounter.     AVS Reports     No AVS Snapshots are available for this encounter.      Smoking Cessation Audit Trail       Diabetic Foot Exam    No data filed     Diabetic Foot Form - Detailed    No data filed     Diabetic Foot Exam - Simple    No data filed     Guarantor Account: [name] LegatoRender, Rylinn ([id number])     Relation to Patient: Account Type Service Area    Self Personal/Family [name] [facility]          Guarantor Account: [name] LegatoRender, [name] ([id number])     Relation to Patient: Account Type Service Area    Self Personal/Family [name] [facility]          Guarantor Account: [name] LegatoRender, [name] ([id number])     Relation to Patient: Account Type Service Area    Self Personal/Family [facility]         This encounter was created in error - please disregard.

## 2014-06-24 NOTE — Progress Notes (Signed)
Patient ID: [name], female   DOB: 02-Sep-1929, 81 y.o.   MRN: [id number]  Progress Notes      [name] CaulMichael G Robson, MD at 06/23/2014 6:10 PM     Status: Addendum       Expand All Collapse All   Patient ID: [name], female DOB: 02-Sep-1929, 81 y.o. MRN: [id number]        PROGRESS NOTE  DATE:06/18/2014   FACILITY:[facility]   LEVEL OF CARE: SNF   Routine Visit    CHIEF COMPLAINT: Routine visit/[facility] visit.   HISTORY OF PRESENT ILLNESS: This is a patient who has been in the building since February 2013.   She really has no major medical issues. She is up in the wheelchair in the hall.   Her big issue is that of severe aortic stenosis for which she refused any aggressive evaluations or treatment. In spite of this, she has been reasonably asymptomatic within the limited exertional issues she does.   PAST MEDICAL HISTORY/PROBLEM LIST:   Severe aortic stenosis, with last echocardiogram in [facility] showing a valve area of 0.69 sq cm.   Lumbar spinal stenosis.   History of dementia with depression.   History of COPD.   Gastroesophageal reflux.   Hypothyroidism. On replacement.   Hyperlipidemia.   Hypertension.   Chronic diastolic heart failure.  CURRENT MEDICATIONS: Medication list is reviewed.   Synthroid 100 daily.   Prilosec 40 q.d.   MiraLAX daily.   Crestor 5 q.d.   VesiCARE 10 q.d.   Effexor 150 q.d.   Milk of Magnesia p.r.n.   Advair 250/50 q.12.   Lopressor 25 twice daily.   Senokot 1 tablet daily.   Namenda 10 mg daily.   Systane ophthalmic.   Aricept 10 q.d.   Restoril 7.5 q.h.s. routinely.   Vitamin D3, 50,000 U  monthly.  LABORATORY DATA: Lab work last checked on 05/08/2014:   Essentially normal CBC with a white count of 6.4, hemoglobin of 14.6, platelet count of 283.   Normal comprehensive metabolic panel except for an albumin of 3.4 and a creatinine of 1.23.   Fasting lipid panel showed an LDL of 69.   PHYSICAL EXAMINATION:  VITAL SIGNS:  O2 SATURATIONS: 92% on room air.  RESPIRATIONS: 18.  PULSE: 60.  WEIGHT: 204 pounds, which seems to be stable in looking back through the records.  GENERAL APPEARANCE: The patient, again, greets me in a warm and friendly fashion.  CHEST/RESPIRATORY: Shallow air entry. No cough. Mild expiratory wheeze.  CARDIOVASCULAR:  CARDIAC: There is a very soft systolic ejection murmur. No S3. Her JVP is not elevated.  GASTROINTESTINAL:  ABDOMEN: Obese. No masses are noted.   ASSESSMENT/PLAN:   Aortic stenosis. Listed as severe. However, she is currently asymptomatic.   Gastroesophageal reflux disease. On Prilosec. This is also asymptomatic.   Hypertension with stage I chronic renal failure. This is largely stable. She has microalbumin:creatinine ratios which are well within the normal range.   Hypothyroidism. On replacement. I do not see a recent TSH. However, this may be in the facility EHR.            Revision History       Date/Time User Action    > 06/24/2014 4:27 AM [name] CaulMichael G Robson, MD Addend     06/24/2014 4:27 AM [name] CaulMichael G Robson, MD Sign     06/23/2014 6:12 PM [name] Share               Psych  Notes                                                AVS Reports     No AVS Snapshots are available for this encounter.     Smoking Cessation Audit Trail       Diabetic Foot Exam    No data filed     Diabetic Foot Form - Detailed    No data filed     Diabetic Foot Exam -  Simple    No data filed     Guarantor Account: [name], [name] ([id number])     Relation to Patient: Account Type Service Area    Self Personal/Family [place] [facility]          Guarantor Account: [name], [name] ([id number])     Relation to Patient: Account Type Service Area    Self Personal/Family [place] [facility]          Guarantor Account: [name], [name] ([id number])     Relation to Patient: Account Type Service Area    Self Personal/Family [facility]         This encounter was created in error - please disregard.

## 2014-07-04 ENCOUNTER — Other Ambulatory Visit: Payer: Self-pay | Admitting: *Deleted

## 2014-07-04 MED ORDER — TEMAZEPAM 7.5 MG PO CAPS: ORAL_CAPSULE | ORAL | Status: DC

## 2014-07-04 NOTE — Telephone Encounter (Signed)
[facility] 

## 2014-07-30 ENCOUNTER — Non-Acute Institutional Stay (SKILLED_NURSING_FACILITY): Payer: Medicare Other | Admitting: Internal Medicine

## 2014-07-30 DIAGNOSIS — I350 Nonrheumatic aortic (valve) stenosis: Secondary | ICD-10-CM

## 2014-07-30 DIAGNOSIS — K219 Gastro-esophageal reflux disease without esophagitis: Secondary | ICD-10-CM

## 2014-07-30 DIAGNOSIS — E038 Other specified hypothyroidism: Secondary | ICD-10-CM

## 2014-08-06 NOTE — Progress Notes (Addendum)
Patient ID: [name], female   DOB: 1929-04-27, 81 y.o.   MRN: [id number]                PROGRESS NOTE  DATE:  07/30/2014         FACILITY: [facility]                     LEVEL OF CARE:   SNF   Routine Visit   CHIEF COMPLAINT:  Routine visit/[facility] visit.        HISTORY OF PRESENT ILLNESS:  This is a patient who has been in the building since February 2013.       She spends most of her time in a wheelchair.    Her major medical issue is that she has severe aortic stenosis for which she really had refused any aggressive evaluations or treatment.  In spite of this, she has been reasonably stable with no chest pain and no congestive heart failure.  She does not really seem limited, albeit with the limited amount of activity that she does.    PAST MEDICAL HISTORY/PROBLEM LIST:          Severe aortic stenosis, with the last echocardiogram in [facility] showing a valve area of 0.69 sq cm.    Lumbar spinal stenosis.     History of dementia with depression.    History of gastroesophageal reflux.    COPD.    Hypothyroidism.  On replacement.    Hypertension.    Hyperlipidemia.    Chronic diastolic heart failure.     CURRENT MEDICATIONS:  Medication list is reviewed.                Synthroid 100 q.d.       Prilosec 40 q.d.        MiraLAX 17 g q.d.       Crestor 5 q.d.       VESIcare 10 q.d.      Effexor XR 150 q.d.       Milk of Magnesia 30 mL daily.      Advair Diskus 250/50 q.12.       Lopressor 25 b.i.d.       Senokot-S b.i.d.     Namenda 10 mg b.i.d.       Systane ophthalmic.    Aricept 10 q.d.       Vitamin D3, 50,000 U monthly.      Restoril 7.5 q.h.s. p.r.n.       LABORATORY DATA:    Last checked on 05/08/2014, showing:      CBC:  Normal, with a hemoglobin of 14.6.     Comprehensive metabolic panel:  BUN 15, creatinine 1.23.    Lipid panel:  LDL cholesterol 69.    PHYSICAL EXAMINATION:   VITAL SIGNS:     TEMPERATURE:     PULSE:   RESPIRATIONS:   BLOOD PRESSURE:   02 SATURATIONS:   HEIGHT:   WEIGHT:   GENERAL APPEARANCE:  The patient is not in any distress.    CHEST/RESPIRATORY:  Shallow, but otherwise clear air entry.  No wheezing.      CARDIOVASCULAR:   CARDIAC:  There is a 2/6 systolic ejection murmur at the aortic area.  No S3.  This does not radiate.  Her JVP is not elevated.   EDEMA/VARICOSITIES:  She has no edema.        GASTROINTESTINAL:   ABDOMEN:  Obese, without masses.  ASSESSMENT/PLAN:                           Aortic stenosis.  Listed as severe.  However, she has no current symptoms.    Gastroesophageal reflux disease.  On Prilosec.    Hypertension with chronic renal failure.   This is largely stable.    Hypothyroidism.  On replacement.

## 2014-09-28 ENCOUNTER — Non-Acute Institutional Stay (SKILLED_NURSING_FACILITY): Payer: Medicare Other | Admitting: Internal Medicine

## 2014-09-28 DIAGNOSIS — N183 Chronic kidney disease, stage 3 (moderate): Secondary | ICD-10-CM

## 2014-09-28 DIAGNOSIS — N181 Chronic kidney disease, stage 1: Secondary | ICD-10-CM

## 2014-09-28 DIAGNOSIS — I350 Nonrheumatic aortic (valve) stenosis: Secondary | ICD-10-CM

## 2014-09-28 DIAGNOSIS — M4716 Other spondylosis with myelopathy, lumbar region: Secondary | ICD-10-CM

## 2014-09-28 DIAGNOSIS — N189 Chronic kidney disease, unspecified: Secondary | ICD-10-CM

## 2014-09-28 DIAGNOSIS — G309 Alzheimer's disease, unspecified: Secondary | ICD-10-CM | POA: Diagnosis not present

## 2014-09-28 DIAGNOSIS — N185 Chronic kidney disease, stage 5: Secondary | ICD-10-CM | POA: Diagnosis not present

## 2014-09-28 DIAGNOSIS — I129 Hypertensive chronic kidney disease with stage 1 through stage 4 chronic kidney disease, or unspecified chronic kidney disease: Secondary | ICD-10-CM | POA: Diagnosis not present

## 2014-09-28 DIAGNOSIS — N182 Chronic kidney disease, stage 2 (mild): Secondary | ICD-10-CM | POA: Diagnosis not present

## 2014-09-28 DIAGNOSIS — N184 Chronic kidney disease, stage 4 (severe): Secondary | ICD-10-CM | POA: Diagnosis not present

## 2014-09-28 DIAGNOSIS — F0280 Dementia in other diseases classified elsewhere without behavioral disturbance: Secondary | ICD-10-CM

## 2014-10-01 NOTE — Progress Notes (Addendum)
Patient ID: [name], female   DOB: 12/30/1929, 81 y.o.   MRN: [id number]                PROGRESS NOTE  DATE:  09/28/2014          FACILITY: [facility]                  LEVEL OF CARE:   SNF   Routine Visit                 CHIEF COMPLAINT:  Routine visit to follow medical issues/[facility] visit.      HISTORY OF PRESENT ILLNESS:  This is a patient who has been in the building since 2013.  She is wheelchair-bound.    Her major source of disability is dementia with depression for which she follows with Psychiatry.    She also has severe aortic stenosis with her last echocardiogram in [facility] showing a valve area of 0.69 sq cm yet she has been asymptomatic with no evidence or complaints of chest pain or SOB   PAST MEDICAL HISTORY/PROBLEM LIST:             Severe aortic stenosis.    Lumbar spinal stenosis.    History of dementia with depression.    History of gastroesophageal reflux.    COPD.    Hypothyroidism.  On replacement.    Hypertension.    Hyperlipidemia.    Chronic diastolic heart failure.    CURRENT MEDICATIONS:  Medication list is reviewed.             Synthroid 100 q.d.      Prilosec 40 q.d.       MiraLAX 17 g daily.    Crestor 5 q.d.      VESIcare 10 q.d.     Effexor 150 q.d.      Milk of Magnesia 30 mL daily.    Advair 250/50, 1 puff q.12.      Lopressor 25 b.i.d.     Namenda 10 mg b.i.Marland Kitchend.      Aricept 10 q.d.      Systane ophthalmic three times a day.    Vitamin D3, 50,000 U once monthly.    LABORATORY DATA:   Last TSH that I see was from June 2015 at 1.484.    Comprehensive metabolic panel from 05/08/2014:  BUN 15, creatinine 1.23, potassium 4.3.  Estimated GFR 51.    CBC from 05/08/2014:  Hemoglobin 14.6, platelet count 283, white count 6.4.     REVIEW OF SYSTEMS:  Weights: her weights have been gradually declining most recently at 199lbs  CHEST/RESPIRATORY:  She does not complain of shortness of breath.     CARDIAC:  No chest pain.    GI:  No nausea, vomiting, or diarrhea.      EDEMA/VARICOSITIES:  Extremities:  No edema.     PHYSICAL EXAMINATION:   VITAL SIGNS:     TEMPERATURE:  98.    PULSE:  77.     RESPIRATIONS:  20.    BLOOD PRESSURE:  120/50.   02 SATURATIONS:  98% on room air.   GENERAL APPEARANCE:  The patient is lying flat in bed and appears comfortable.   CHEST/RESPIRATORY:  Clear air entry bilaterally.    CARDIOVASCULAR:   CARDIAC:  There is a 3/6 systolic ejection murmur.   There is no S3.  This does not radiate.  Her JVP is not elevated.    EDEMA/VARICOSITIES:  She has no  peripheral or coccyx edema.   GASTROINTESTINAL:   ABDOMEN:  Obese, without masses.     LIVER/SPLEEN/KIDNEYS:  No liver, no spleen.    ASSESSMENT/PLAN:             Aortic stenosis.  This is severe.  Last echocardiogram was in August 2014, at which time her EF was 60-65%.  Valve area 0.69 sq cm.  She had grade 1 diastolic dysfunction.  She has not had any heart failure or chest pain that I am aware of.  As I understand things the patient at one point refused a further evaluation which would have included a cardiac cath.   Gastroesophageal reflux.  On Prilosec.    Hypertension with chronic renal failure.  Currently well controlled. I do not see a recent systolic BP above 130.  She does not have diastolic hypertension  COPD.  This has remained stable.    Major depression, recurrent.  She is followed by Psychiatry in-house.  She is on Effexor.     History of psychosis.  This has been stable   CPT CODE: 16109

## 2015-01-02 ENCOUNTER — Other Ambulatory Visit: Payer: Self-pay

## 2015-01-02 MED ORDER — TEMAZEPAM 7.5 MG PO CAPS
ORAL_CAPSULE | ORAL | Status: DC
Start: 2015-01-02 — End: 2015-04-18

## 2015-01-02 NOTE — Telephone Encounter (Signed)
Rx faxed to [facility] @ [phone], phone number [phone]  

## 2015-04-16 ENCOUNTER — Non-Acute Institutional Stay (SKILLED_NURSING_FACILITY): Payer: Medicare Other | Admitting: Internal Medicine

## 2015-04-16 DIAGNOSIS — H269 Unspecified cataract: Secondary | ICD-10-CM

## 2015-04-16 DIAGNOSIS — G309 Alzheimer's disease, unspecified: Secondary | ICD-10-CM

## 2015-04-16 DIAGNOSIS — I5189 Other ill-defined heart diseases: Secondary | ICD-10-CM

## 2015-04-16 DIAGNOSIS — R911 Solitary pulmonary nodule: Secondary | ICD-10-CM | POA: Diagnosis not present

## 2015-04-16 DIAGNOSIS — F0280 Dementia in other diseases classified elsewhere without behavioral disturbance: Secondary | ICD-10-CM | POA: Diagnosis not present

## 2015-04-16 DIAGNOSIS — I10 Essential (primary) hypertension: Secondary | ICD-10-CM

## 2015-04-16 DIAGNOSIS — I350 Nonrheumatic aortic (valve) stenosis: Secondary | ICD-10-CM | POA: Diagnosis not present

## 2015-04-16 DIAGNOSIS — I519 Heart disease, unspecified: Secondary | ICD-10-CM

## 2015-04-16 DIAGNOSIS — E039 Hypothyroidism, unspecified: Secondary | ICD-10-CM | POA: Diagnosis not present

## 2015-04-16 DIAGNOSIS — R32 Unspecified urinary incontinence: Secondary | ICD-10-CM | POA: Diagnosis not present

## 2015-04-18 ENCOUNTER — Encounter: Payer: Self-pay | Admitting: Internal Medicine

## 2015-04-18 DIAGNOSIS — R32 Unspecified urinary incontinence: Secondary | ICD-10-CM

## 2015-04-18 DIAGNOSIS — H269 Unspecified cataract: Secondary | ICD-10-CM

## 2015-04-18 DIAGNOSIS — E039 Hypothyroidism, unspecified: Secondary | ICD-10-CM

## 2015-04-18 DIAGNOSIS — R269 Unspecified abnormalities of gait and mobility: Secondary | ICD-10-CM | POA: Insufficient documentation

## 2015-04-18 HISTORY — DX: Unspecified abnormalities of gait and mobility: R26.9

## 2015-04-18 HISTORY — DX: Unspecified urinary incontinence: R32

## 2015-04-18 HISTORY — DX: Unspecified cataract: H26.9

## 2015-04-18 HISTORY — DX: Hypothyroidism, unspecified: E03.9

## 2015-04-18 NOTE — Progress Notes (Signed)
Patient ID: [name], female   DOB: [birth year], 82 y.o.   MRN: [id number]    [id number]  Place of Service: SNF (31)     Allergies  Allergen Reactions  . Penicillins Other (See Comments)    unknown    Chief Complaint  Patient presents with  . Medical Management of Chronic Issues    Presurgical clearance for cataract surgery    HPI:  This patient has had dense cataracts with the left side being worse than the right. She was recently seen by Dr. Hedda Slade, at [facility]. He is proposing cataract surgery of the left eye under IV sedation.  Medications: Patient's Medications  New Prescriptions   No medications on file  Previous Medications   ACETAMINOPHEN (TYLENOL) 325 MG TABLET    Take 650 mg by mouth every 8 (eight) hours as needed for pain.   FLUTICASONE-SALMETEROL (ADVAIR) 250-50 MCG/DOSE AEPB    Inhale 1 puff into the lungs every 12 (twelve) hours.   LEVOTHYROXINE (SYNTHROID, LEVOTHROID) 100 MCG TABLET    Take 100 mcg by mouth daily before breakfast.   MAGNESIUM HYDROXIDE (MILK OF MAGNESIA) 400 MG/5ML SUSPENSION    Take 30 mLs by mouth daily as needed for constipation.   MEMANTINE HCL ER (NAMENDA XR) 28 MG CP24    Take 1 capsule by mouth daily.   METOPROLOL TARTRATE (LOPRESSOR) 25 MG TABLET    Take 25 mg by mouth 2 (two) times daily.   OMEPRAZOLE (PRILOSEC) 40 MG CAPSULE    Take 1 capsule (40 mg total) by mouth daily.   POLYETHYL GLYCOL-PROPYL GLYCOL (SYSTANE) 0.4-0.3 % SOLN    Place 1 drop into both eyes 3 (three) times daily.   POLYETHYLENE GLYCOL (MIRALAX / GLYCOLAX) PACKET    Take 17 g by mouth daily.   RISPERIDONE (RISPERDAL) 0.25 MG TABLET    Take 0.5 tablets (0.125 mg total) by mouth at bedtime.   ROSUVASTATIN (CRESTOR) 5 MG TABLET    Take 5 mg by mouth daily.   SENNA-DOCUSATE (SENOKOT-S) 8.6-50 MG PER TABLET    Take 1 tablet by mouth 2 (two) times daily.   SOLIFENACIN (VESICARE) 5 MG  TABLET    Take 10 mg by mouth daily.   VENLAFAXINE XR (EFFEXOR-XR) 150 MG 24 HR CAPSULE    Take 150 mg by mouth daily.  Modified Medications   No medications on file  Discontinued Medications   ALBUTEROL (PROVENTIL) (5 MG/ML) 0.5% NEBULIZER SOLUTION    Take 0.5 mLs (2.5 mg total) by nebulization every 4 (four) hours as needed. wheezing   ALUM & MAG HYDROXIDE-SIMETH (MAALOX/MYLANTA) 200-200-20 MG/5ML SUSPENSION    Take 30 mLs by mouth every 6 (six) hours as needed.   AMLODIPINE (NORVASC) 5 MG TABLET    Take 5 mg by mouth daily.   BISACODYL (DULCOLAX) 10 MG SUPPOSITORY    Place 1 suppository (10 mg total) rectally daily as needed.   DONEPEZIL (ARICEPT) 10 MG TABLET    Take 10 mg by mouth at bedtime.    FEEDING SUPPLEMENT (ENSURE COMPLETE) LIQD    Take 237 mLs by mouth 3 (three) times daily with meals.   HYDROCODONE-ACETAMINOPHEN (NORCO/VICODIN) 5-325 MG PER TABLET    Take one tablet by mouth every 4-6 hours as needed for pain   OXYCODONE (OXY IR/ROXICODONE) 5 MG IMMEDIATE RELEASE TABLET    Take 0.5 tablets (2.5 mg total) by mouth every 4 (four) hours as needed  for pain.   SODIUM PHOSPHATE (FLEET) 7-19 GM/118ML ENEM    Place 1 enema rectally daily as needed.   TEMAZEPAM (RESTORIL) 15 MG CAPSULE    Take 1 capsule by mouth every night at bedtime no later than 11 pm.   TEMAZEPAM (RESTORIL) 7.5 MG CAPSULE    Take one capsule by mouth every night at bedtime as needed for insomnia     Review of Systems  Constitutional: Negative for fever, chills, diaphoresis, activity change, appetite change, fatigue and unexpected weight change.       Overweight  HENT: Negative for congestion, ear discharge, ear pain, hearing loss, postnasal drip, rhinorrhea, sore throat, tinnitus, trouble swallowing and voice change.   Eyes: Positive for visual disturbance (dense cataracts bilaterally). Negative for pain, redness and itching.  Respiratory: Negative for cough, choking, shortness of breath and wheezing.         History of wheezing. DuoNeb was ordered 04/04/2015 as well as Advair.  Cardiovascular: Positive for leg swelling. Negative for chest pain and palpitations.  Gastrointestinal: Positive for constipation. Negative for nausea, abdominal pain, diarrhea and abdominal distention.       History of Mallory-Weiss tear in 2013  Endocrine: Negative for cold intolerance, heat intolerance, polydipsia, polyphagia and polyuria.       Hypothyroid  Genitourinary: Negative for dysuria, urgency, frequency, hematuria, flank pain, vaginal discharge, difficulty urinating and pelvic pain.       Incontinent  Musculoskeletal: Negative for myalgias, back pain, arthralgias, gait problem, neck pain and neck stiffness.       Generalized weakness  Skin: Negative for color change, pallor and rash.  Allergic/Immunologic: Negative.   Neurological: Negative for dizziness, tremors, seizures, syncope, weakness, numbness and headaches.       History dementia, likely Alzheimer's  Hematological: Negative for adenopathy. Does not bruise/bleed easily.  Psychiatric/Behavioral: Positive for confusion, sleep disturbance and dysphoric mood. Negative for suicidal ideas, hallucinations, behavioral problems and agitation. The patient is nervous/anxious. The patient is not hyperactive.     Filed Vitals:   04/18/15 1614  BP: 125/70  Pulse: 64  Temp: 97.9 F (36.6 C)  Resp: 18  Height: '5\' 6"'$  (1.676 m)  Weight: 199 lb (90.266 kg)   Wt Readings from Last 3 Encounters:  04/18/15 199 lb (90.266 kg)  10/16/12 205 lb 4 oz (93.1 kg)  04/18/11 167 lb 8.8 oz (76 kg)    Body mass index is 32.13 kg/(m^2).  Physical Exam  Constitutional: She is oriented to person, place, and time. She appears well-developed and well-nourished. No distress.  Overweight  HENT:  Right Ear: External ear normal.  Left Ear: External ear normal.  Nose: Nose normal.  Mouth/Throat: Oropharynx is clear and moist. No oropharyngeal exudate.  Eyes: Conjunctivae and  EOM are normal. Pupils are equal, round, and reactive to light. No scleral icterus.  Dense cataracts bilaterally; left worse than the right. Significant visual handicap.  Neck: No JVD present. No tracheal deviation present. No thyromegaly present.  Cardiovascular: Normal rate, regular rhythm and intact distal pulses.  Exam reveals no gallop and no friction rub.   No murmur heard. 3/6 aortic ejection murmur  Pulmonary/Chest: Effort normal. No respiratory distress. She has no wheezes. She has no rales. She exhibits no tenderness.  Abdominal: She exhibits no distension and no mass. There is no tenderness.  Musculoskeletal: Normal range of motion. She exhibits no edema or tenderness.  Generalized weakness  Lymphadenopathy:    She has no cervical adenopathy.  Neurological: She is alert  and oriented to person, place, and time. No cranial nerve deficit. Coordination normal.  Dementia  Skin: No rash noted. She is not diaphoretic. No erythema. No pallor.  Areas of depigmentation above the eyebrows, on the neck, and on the trunk suggestive of vitiligo.  Psychiatric: She has a normal mood and affect. Her behavior is normal. Judgment and thought content normal.     Labs reviewed: Lab Summary Latest Ref Rng 10/11/2012 10/11/2012 10/11/2012  Hemoglobin 12.0 - 15.0 g/dL 14.6 (None) 13.8  Hematocrit 36.0 - 46.0 % 42.1 (None) 41.7  White count 4.0 - 10.5 K/uL 10.4 (None) 9.1  Platelet count 150 - 400 K/uL 324 (None) 326  Sodium 135 - 145 mEq/L (None) (None) 140  Potassium 3.5 - 5.1 mEq/L (None) (None) 3.8  Calcium 8.4 - 10.5 mg/dL (None) (None) 10.0  Phosphorus - (None) (None) (None)  Creatinine 0.50 - 1.10 mg/dL 0.79 (None) 0.81  AST 0 - 37 U/L (None) 16 (None)  Alk Phos 39 - 117 U/L (None) 81 (None)  Bilirubin 0.3 - 1.2 mg/dL (None) 0.4 (None)  Glucose 70 - 99 mg/dL (None) (None) 181(H)  Cholesterol - (None) (None) (None)  HDL cholesterol - (None) (None) (None)  Triglycerides - (None) (None) (None)    LDL Direct - (None) (None) (None)  LDL Calc - (None) (None) (None)  Total protein 6.0 - 8.3 g/dL (None) 7.9 (None)  Albumin 3.5 - 5.2 g/dL (None) 3.4(L) (None)   Lab Results  Component Value Date   TSH 1.648 10/11/2012   Lab Results  Component Value Date   BUN 14 10/11/2012   BUN 18 04/20/2011   BUN 21 04/19/2011   Lab Results  Component Value Date   CREATININE 0.79 10/11/2012   CREATININE 0.81 10/11/2012   CREATININE 0.94 04/20/2011   Lab Results  Component Value Date   HGBA1C  06/16/2008    5.4 (NOTE) The ADA recommends the following therapeutic goal for glycemic control related to Hgb A1c measurement: Goal of therapy: <6.5 Hgb A1c  Reference: American Diabetes Association: Clinical Practice Recommendations 2010, Diabetes Care, 2010, 33: (Suppl  1).   HGBA1C  09/05/2006    5.7 (NOTE)   The ADA recommends the following therapeutic goals for glycemic   control related to Hgb A1C measurement:   Goal of Therapy:   < 7.0% Hgb A1C   Action Suggested:  > 8.0% Hgb A1C   Ref:  Diabetes Care, 22, Suppl. 1, 1999       Assessment/Plan 1. Cataract Approved for pending surgery on the left eye. Medical conditions are chronic and stable and should not interfere with the surgery.  2. Essential hypertension Controlled -CMP  3. Alzheimer's dementia Chronic. Remains on memantine.  4. Lung nodule Follow-up studies in 2014 suggested a benign lesion without a need for further studies in the future.  5. Aortic stenosis, severe Known diagnosis for several years.  6. Diastolic dysfunction 2-D echocardiogram 10/12/2012 showed a 60-65% LVEF. Severe aortic valve stenosis was noted with trivial regurgitation. There was a calcified annulus of the mitral valve. Patient has not had any recent angina or known arrhythmia.  7. Hypothyroidism TSH  8. Hyperlipidemia -Lipid panel

## 2015-05-26 NOTE — Progress Notes (Signed)
HPI: 82 yo female for preoperative evaluation prior to cataract surgery. Echo 8/14 showed normal LV function, mild LVH, grade 1 diastolic dysfunction, severe AS (mean gradient 42 mmHg). Chest CT 11/14 showed 3 vessel coronary calcification. Patient lives in a nursing home and is essentially wheelchair bound although limited ambulation with walker. She apparently has some dementia as well. She denies dyspnea, chest pain, palpitations, syncope or pedal edema.  Current Outpatient Prescriptions  Medication Sig Dispense Refill  . acetaminophen (TYLENOL) 325 MG tablet Take 650 mg by mouth every 8 (eight) hours as needed for pain.    Marland Kitchen Fluticasone-Salmeterol (ADVAIR) 250-50 MCG/DOSE AEPB Inhale 1 puff into the lungs every 12 (twelve) hours.    Marland Kitchen levothyroxine (SYNTHROID, LEVOTHROID) 100 MCG tablet Take 100 mcg by mouth daily before breakfast.    . magnesium hydroxide (MILK OF MAGNESIA) 400 MG/5ML suspension Take 30 mLs by mouth daily as needed for constipation.    . Memantine HCl ER (NAMENDA XR) 28 MG CP24 Take 1 capsule by mouth daily.    Marland Kitchen omeprazole (PRILOSEC) 40 MG capsule Take 1 capsule (40 mg total) by mouth daily. 30 capsule 0  . Polyethyl Glycol-Propyl Glycol (SYSTANE) 0.4-0.3 % SOLN Place 1 drop into both eyes 3 (three) times daily.    . polyethylene glycol (MIRALAX / GLYCOLAX) packet Take 17 g by mouth daily. 14 each 0  . risperiDONE (RISPERDAL) 0.25 MG tablet Take 0.5 tablets (0.125 mg total) by mouth at bedtime.    . rosuvastatin (CRESTOR) 5 MG tablet Take 5 mg by mouth daily.    Marland Kitchen senna-docusate (SENOKOT-S) 8.6-50 MG per tablet Take 1 tablet by mouth 2 (two) times daily.    . solifenacin (VESICARE) 5 MG tablet Take 10 mg by mouth daily.    . traZODone (DESYREL) 50 MG tablet Take 50 mg by mouth at bedtime.    Marland Kitchen venlafaxine XR (EFFEXOR-XR) 150 MG 24 hr capsule Take 150 mg by mouth daily.    . metoprolol tartrate (LOPRESSOR) 25 MG tablet Take 25 mg by mouth 2 (two) times daily.     No  current facility-administered medications for this visit.    Allergies  Allergen Reactions  . Penicillins Other (See Comments)    unknown     Past Medical History  Diagnosis Date  . Blood transfusion   . Dementia with behavioral disturbance   . Esophageal reflux   . Essential hypertension, benign   . Personal history of alcoholism (HCC)   . Hypopotassemia   . Anemia   . Aortic stenosis, severe 10/11/2012  . Asthma, chronic 10/11/2012  . Diastolic dysfunction 10/11/2012  . Generalized and unspecified atherosclerosis 03/30/2011  . Generalized weakness 04/18/2011  . HIATAL HERNIA 09/05/2006    Qualifier: Diagnosis of  By: Glyn Ade CMA (AAMA), June    . Insomnia 10/13/2012  . Lung nodule 10/12/2012  . 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 Other and unspecified hyperlipidemia 10/11/2012  . Unspecified constipation 10/11/2012  . Abnormality of gait 04/18/2015  . Cataract 04/18/2015  . Hypothyroidism 04/18/2015  . Urine incontinence 04/18/2015  . COPD (chronic obstructive pulmonary disease) [facility])     Past Surgical History  Procedure Laterality Date  . Colonoscopy  03/08/2011    Procedure: COLONOSCOPY;  Surgeon: Erick Blinks, MD;  Location: [facility] ENDOSCOPY;  Service: Gastroenterology;  Laterality: N/A;  . No past surgeries      Social  History   Social History  . Marital Status: Widowed    Spouse Name: N/A  . Number of Children: 4  . Years of Education: N/A   Occupational History  . Not on file.   Social History Main Topics  . Smoking status: Former Smoker -- 1.00 packs/day    Types: Cigarettes  . Smokeless tobacco: Never Used  . Alcohol Use: No     Comment: former   . Drug Use: No  . Sexual Activity: No   Other Topics Concern  . Not on file   Social History Narrative    Family History  Problem Relation Age of Onset  . CAD Mother     ROS: no fevers or chills, productive cough,  hemoptysis, dysphasia, odynophagia, melena, hematochezia, dysuria, hematuria, rash, seizure activity, orthopnea, PND, pedal edema, claudication. Remaining systems are negative.  Physical Exam:   Blood pressure 126/80, pulse 55, height 5\' 5"  (1.651 m), weight 199 lb (90.266 kg).  General:  Well developed/obese in NAD Skin warm/dry, Vitiligo on face Patient not depressed No peripheral clubbing Back-normal HEENT-normal/normal eyelids Neck supple/normal carotid upstroke bilaterally; no JVD; no thyromegaly chest - CTA/ normal expansion CV - RRR/normal S1; no rubs or gallops;  PMI nondisplaced, 3/6 systolic murmur with radiation to carotids, S2 diminished Abdomen -NT/ND, no HSM, no mass, + bowel sounds, no bruit 2+ femoral pulses, no bruits Ext-no edema, chords, 1+ DP Neuro-grossly nonfocal, Does not know year.  ECG Sinus bradycardia, septal MI, nonspecific ST changes

## 2015-05-30 ENCOUNTER — Encounter: Payer: Self-pay | Admitting: Cardiology

## 2015-05-30 ENCOUNTER — Ambulatory Visit (INDEPENDENT_AMBULATORY_CARE_PROVIDER_SITE_OTHER): Payer: Medicaid Other | Admitting: Cardiology

## 2015-05-30 DIAGNOSIS — Z01810 Encounter for preprocedural cardiovascular examination: Secondary | ICD-10-CM

## 2015-05-30 DIAGNOSIS — I350 Nonrheumatic aortic (valve) stenosis: Secondary | ICD-10-CM | POA: Diagnosis not present

## 2015-05-30 NOTE — Patient Instructions (Signed)
Medication Instructions:   NO CHANGE.   Testing/Procedures:  Your physician has requested that you have an echocardiogram. Echocardiography is a painless test that uses sound waves to create images of your heart. It provides your doctor with information about the size and shape of your heart and how well your heart's chambers and valves are working. This procedure takes approximately one hour. There are no restrictions for this procedure.    Follow-Up:  Your physician wants you to follow-up in: 6 MONTHS WITH DR CRENSHAW. You will receive a reminder letter in the mail two months in advance. If you don't receive a letter, please call our office to schedule the follow-up appointment.     Any Other Special Instructions Will Be Listed Below (If Applicable).

## 2015-05-30 NOTE — Assessment & Plan Note (Signed)
Aortic stenosis was severe in 2014.It does sound severe on examination today. She is not having symptoms. We will repeat echocardiogram. I discussed the progressive nature of aortic stenosis with patient and daughter. Given her age, dementia and nursing home status they would like only conservative measures. They do not wish to pursue aortic valve replacement including TAVR.

## 2015-05-30 NOTE — Assessment & Plan Note (Signed)
Blood pressure controlled. Continue present medications. 

## 2015-05-30 NOTE — Assessment & Plan Note (Signed)
Given severe aortic stenosis patient will be high riskFor any procedure. I have recommended against cataract surgery.

## 2015-06-12 ENCOUNTER — Ambulatory Visit (HOSPITAL_COMMUNITY): Payer: Medicare Other | Attending: Cardiology

## 2015-06-12 ENCOUNTER — Other Ambulatory Visit: Payer: Self-pay

## 2015-06-12 DIAGNOSIS — I5189 Other ill-defined heart diseases: Secondary | ICD-10-CM | POA: Diagnosis not present

## 2015-06-12 DIAGNOSIS — I340 Nonrheumatic mitral (valve) insufficiency: Secondary | ICD-10-CM | POA: Insufficient documentation

## 2015-06-12 DIAGNOSIS — I350 Nonrheumatic aortic (valve) stenosis: Secondary | ICD-10-CM

## 2015-06-12 DIAGNOSIS — I352 Nonrheumatic aortic (valve) stenosis with insufficiency: Secondary | ICD-10-CM | POA: Diagnosis not present

## 2015-06-12 DIAGNOSIS — I517 Cardiomegaly: Secondary | ICD-10-CM | POA: Insufficient documentation

## 2015-06-13 ENCOUNTER — Encounter: Payer: Self-pay | Admitting: *Deleted

## 2015-11-24 NOTE — Progress Notes (Signed)
HPI: FU AS. Echo 8/14 showed normal LV function, mild LVH, grade 1 diastolic dysfunction, severe AS (mean gradient 42 mmHg). Chest CT 11/14 showed 3 vessel coronary calcification. Echocardiogram repeated April 2017 and showed normal LV systolic function, grade 2 diastolic dysfunction, severe aortic stenosis and mild aortic insufficiency. Patient lives in a nursing home and is essentially wheelchair bound although limited ambulation with walker. Given dementia and comorbidities patient and family do not wish to pursue aortic valve replacement including TAVR. Since last seen, patient denies dyspnea, chest pain, palpitations or syncope.  Current Outpatient Prescriptions  Medication Sig Dispense Refill  . acetaminophen (TYLENOL) 325 MG tablet Take 650 mg by mouth every 8 (eight) hours as needed for pain.    Marland Kitchen. Fluticasone-Salmeterol (ADVAIR) 250-50 MCG/DOSE AEPB Inhale 1 puff into the lungs every 12 (twelve) hours.    Marland Kitchen. levothyroxine (SYNTHROID, LEVOTHROID) 100 MCG tablet Take 100 mcg by mouth daily before breakfast.    . magnesium hydroxide (MILK OF MAGNESIA) 400 MG/5ML suspension Take 30 mLs by mouth daily as needed for constipation.    . Memantine HCl ER (NAMENDA XR) 28 MG CP24 Take 10 mg by mouth daily.     Marland Kitchen. omeprazole (PRILOSEC) 40 MG capsule Take 1 capsule (40 mg total) by mouth daily. 30 capsule 0  . Polyethyl Glycol-Propyl Glycol (SYSTANE) 0.4-0.3 % SOLN Place 1 drop into both eyes 3 (three) times daily.    . polyethylene glycol (MIRALAX / GLYCOLAX) packet Take 17 g by mouth daily. 14 each 0  . risperiDONE (RISPERDAL) 0.25 MG tablet Take 0.5 tablets (0.125 mg total) by mouth at bedtime.    . rosuvastatin (CRESTOR) 5 MG tablet Take 10 mg by mouth at bedtime.     . senna-docusate (SENOKOT-S) 8.6-50 MG per tablet Take 1 tablet by mouth 2 (two) times daily.    . solifenacin (VESICARE) 5 MG tablet Take 10 mg by mouth daily.    . traZODone (DESYREL) 50 MG tablet Take 50 mg by mouth at  bedtime.    Marland Kitchen. venlafaxine XR (EFFEXOR-XR) 150 MG 24 hr capsule Take 150 mg by mouth daily.    . metoprolol tartrate (LOPRESSOR) 25 MG tablet Take 25 mg by mouth 2 (two) times daily.     No current facility-administered medications for this visit.      Past Medical History:  Diagnosis Date  . Abnormality of gait 04/18/2015  . Anemia   . Aortic stenosis, severe 10/11/2012  . Asthma, chronic 10/11/2012  . Blood transfusion   . Cataract 04/18/2015  . COPD (chronic obstructive pulmonary disease) (HCC)   . Dementia with behavioral disturbance   . Diastolic dysfunction 10/11/2012  . Esophageal reflux   . Essential hypertension, benign   . Generalized and unspecified atherosclerosis 03/30/2011  . Generalized weakness 04/18/2011  . HIATAL HERNIA 09/05/2006   Qualifier: Diagnosis of  By: Glyn AdeMcMurray CMA (AAMA), June    . Hypopotassemia   . Hypothyroidism 04/18/2015  . Insomnia 10/13/2012  . Lung nodule 10/12/2012  . 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 Other and unspecified hyperlipidemia 10/11/2012  . Personal history of alcoholism (HCC)   . Unspecified constipation 10/11/2012  . Urine incontinence 04/18/2015    Past Surgical History:  Procedure Laterality Date  . COLONOSCOPY  03/08/2011   Procedure: COLONOSCOPY;  Surgeon: Erick BlinksJay Pyrtle, MD;  [facility];  Service: Gastroenterology;  Laterality: N/A;  .  NO PAST SURGERIES      Social History   Social History  . Marital status: Widowed    Spouse name: N/A  . Number of children: 4  . Years of education: N/A   Occupational History  . Not on file.   Social History Main Topics  . Smoking status: Former Smoker    Packs/day: 1.00    Types: Cigarettes  . Smokeless tobacco: Never Used  . Alcohol use No     Comment: former   . Drug use: No  . Sexual activity: No   Other Topics Concern  . Not on file   Social History Narrative  . No narrative  on file    Family History  Problem Relation Age of Onset  . CAD Mother     ROS: no fevers or chills, productive cough, hemoptysis, dysphasia, odynophagia, melena, hematochezia, dysuria, hematuria, rash, seizure activity, orthopnea, PND, pedal edema, claudication. Remaining systems are negative.  Physical Exam: Well-developed obese in no acute distress.  Skin is warm and dry.  HEENT is normal.  Neck is supple.  Chest is clear to auscultation with normal expansion.  Cardiovascular exam is regular rate and rhythm. 3/6 systolic murmur LSB; S2 diminished Abdominal exam nontender or distended. No masses palpated. Extremities show no edema. neuro grossly intact  Electrocardiogram shows sinus rhythm, pacs, left anterior fasicular block, prior septal infarct cannot be excluded, inferior lateral T-wave inversion. A/P  1 Aortic stenosis-severe; given comorbidities family wants only conservative measures and will not consider aortic valve replacement.  2 Hypertension-blood pressure controlled. Continue present medications.  3 hyperlipidemia-continue statin.  Olga Millers, MD

## 2015-11-26 ENCOUNTER — Encounter: Payer: Self-pay | Admitting: Cardiology

## 2015-11-26 ENCOUNTER — Ambulatory Visit (INDEPENDENT_AMBULATORY_CARE_PROVIDER_SITE_OTHER): Payer: Medicare Other | Admitting: Cardiology

## 2015-11-26 VITALS — BP 116/74 | HR 52 | Ht 65.0 in

## 2015-11-26 DIAGNOSIS — I350 Nonrheumatic aortic (valve) stenosis: Secondary | ICD-10-CM

## 2015-11-26 DIAGNOSIS — I10 Essential (primary) hypertension: Secondary | ICD-10-CM | POA: Diagnosis not present

## 2015-11-26 NOTE — Patient Instructions (Signed)
Your physician wants you to follow-up in: 6 MONTHS WITH DR CRENSHAW You will receive a reminder letter in the mail two months in advance. If you don't receive a letter, please call our office to schedule the follow-up appointment.   If you need a refill on your cardiac medications before your next appointment, please call your pharmacy.  

## 2017-01-05 ENCOUNTER — Encounter (HOSPITAL_COMMUNITY): Payer: Self-pay

## 2017-01-05 ENCOUNTER — Emergency Department (HOSPITAL_COMMUNITY): Payer: Medicare Other

## 2017-01-05 ENCOUNTER — Emergency Department (HOSPITAL_COMMUNITY)
Admission: EM | Admit: 2017-01-05 | Discharge: 2017-01-05 | Disposition: A | Discharge status: Home / Self Care | Payer: Medicare Other | Attending: Emergency Medicine | Admitting: Emergency Medicine

## 2017-01-05 DIAGNOSIS — Z87891 Personal history of nicotine dependence: Secondary | ICD-10-CM | POA: Insufficient documentation

## 2017-01-05 DIAGNOSIS — Z79899 Other long term (current) drug therapy: Secondary | ICD-10-CM | POA: Diagnosis not present

## 2017-01-05 DIAGNOSIS — G308 Other Alzheimer's disease: Secondary | ICD-10-CM | POA: Diagnosis not present

## 2017-01-05 DIAGNOSIS — E860 Dehydration: Secondary | ICD-10-CM | POA: Diagnosis not present

## 2017-01-05 DIAGNOSIS — J45909 Unspecified asthma, uncomplicated: Secondary | ICD-10-CM | POA: Diagnosis not present

## 2017-01-05 DIAGNOSIS — I10 Essential (primary) hypertension: Secondary | ICD-10-CM | POA: Diagnosis not present

## 2017-01-05 DIAGNOSIS — J449 Chronic obstructive pulmonary disease, unspecified: Secondary | ICD-10-CM | POA: Insufficient documentation

## 2017-01-05 DIAGNOSIS — E039 Hypothyroidism, unspecified: Secondary | ICD-10-CM | POA: Insufficient documentation

## 2017-01-05 DIAGNOSIS — R109 Unspecified abdominal pain: Secondary | ICD-10-CM

## 2017-01-05 DIAGNOSIS — F0280 Dementia in other diseases classified elsewhere without behavioral disturbance: Secondary | ICD-10-CM | POA: Diagnosis not present

## 2017-01-05 DIAGNOSIS — E876 Hypokalemia: Secondary | ICD-10-CM | POA: Diagnosis not present

## 2017-01-05 DIAGNOSIS — R1012 Left upper quadrant pain: Secondary | ICD-10-CM | POA: Diagnosis present

## 2017-01-05 LAB — I-STAT CHEM 8, ED
BUN: 6 mg/dL (ref 6–20)
CALCIUM ION: 1.24 mmol/L (ref 1.15–1.40)
CHLORIDE: 100 mmol/L — AB (ref 101–111)
Creatinine, Ser: 0.8 mg/dL (ref 0.44–1.00)
GLUCOSE: 69 mg/dL (ref 65–99)
HCT: 54 % — ABNORMAL HIGH (ref 36.0–46.0)
HEMOGLOBIN: 18.4 g/dL — AB (ref 12.0–15.0)
POTASSIUM: 3.2 mmol/L — AB (ref 3.5–5.1)
SODIUM: 144 mmol/L (ref 135–145)
TCO2: 28 mmol/L (ref 22–32)

## 2017-01-05 LAB — URINALYSIS, ROUTINE W REFLEX MICROSCOPIC
BILIRUBIN URINE: NEGATIVE
Glucose, UA: NEGATIVE mg/dL
Hgb urine dipstick: NEGATIVE
KETONES UR: 20 mg/dL — AB
Leukocytes, UA: NEGATIVE
NITRITE: NEGATIVE
Protein, ur: NEGATIVE mg/dL
Specific Gravity, Urine: >1.046 — ABNORMAL HIGH (ref 1.005–1.030)
pH: 5 (ref 5.0–8.0)

## 2017-01-05 LAB — CBC
HCT: 51.2 % — ABNORMAL HIGH (ref 36.0–46.0)
Hemoglobin: 16.6 g/dL — ABNORMAL HIGH (ref 12.0–15.0)
MCH: 25 pg — ABNORMAL LOW (ref 26.0–34.0)
MCHC: 32.4 g/dL (ref 30.0–36.0)
MCV: 77 fL — ABNORMAL LOW (ref 78.0–100.0)
PLATELETS: 295 10*3/uL (ref 150–400)
RBC: 6.65 MIL/uL — ABNORMAL HIGH (ref 3.87–5.11)
RDW: 19 % — AB (ref 11.5–15.5)
WBC: 6.4 10*3/uL (ref 4.0–10.5)

## 2017-01-05 LAB — I-STAT TROPONIN, ED: Troponin i, poc: 0.02 ng/mL (ref 0.00–0.08)

## 2017-01-05 LAB — I-STAT CG4 LACTIC ACID, ED: LACTIC ACID, VENOUS: 1.83 mmol/L (ref 0.5–1.9)

## 2017-01-05 LAB — COMPREHENSIVE METABOLIC PANEL
ALBUMIN: 3.1 g/dL — AB (ref 3.5–5.0)
ALK PHOS: 62 U/L (ref 38–126)
ALT: 6 U/L — ABNORMAL LOW (ref 14–54)
ANION GAP: 12 (ref 5–15)
AST: 15 U/L (ref 15–41)
BILIRUBIN TOTAL: 1.2 mg/dL (ref 0.3–1.2)
BUN: 7 mg/dL (ref 6–20)
CALCIUM: 9.6 mg/dL (ref 8.9–10.3)
CO2: 29 mmol/L (ref 22–32)
Chloride: 103 mmol/L (ref 101–111)
Creatinine, Ser: 0.85 mg/dL (ref 0.44–1.00)
GFR calc Af Amer: >60 mL/min (ref >60)
GFR calc non Af Amer: >60 mL/min (ref >60)
GLUCOSE: 70 mg/dL (ref 65–99)
Potassium: 3.2 mmol/L — ABNORMAL LOW (ref 3.5–5.1)
Sodium: 144 mmol/L (ref 135–145)
TOTAL PROTEIN: 7.5 g/dL (ref 6.5–8.1)

## 2017-01-05 LAB — TYPE AND SCREEN
ABO/RH(D): O POS
ANTIBODY SCREEN: NEGATIVE

## 2017-01-05 LAB — LIPASE, BLOOD: Lipase: 16 U/L (ref 11–51)

## 2017-01-05 LAB — POC OCCULT BLOOD, ED: Fecal Occult Bld: NEGATIVE

## 2017-01-05 LAB — ABO/RH: ABO/RH(D): O POS

## 2017-01-05 MED ORDER — IOPAMIDOL (ISOVUE-300) INJECTION 61%
100.0000 mL | Freq: Once | INTRAVENOUS | Status: AC | PRN
  Administered 2017-01-05: 100 mL via INTRAVENOUS

## 2017-01-05 MED ORDER — SODIUM CHLORIDE 0.9 % IV BOLUS (SEPSIS)
1000.0000 mL | Freq: Once | INTRAVENOUS | Status: AC
  Administered 2017-01-05: 1000 mL via INTRAVENOUS

## 2017-01-05 MED ORDER — IOPAMIDOL (ISOVUE-300) INJECTION 61%
INTRAVENOUS | Status: AC
  Filled 2017-01-05: qty 100

## 2017-01-05 MED ORDER — ONDANSETRON HCL 4 MG/2ML IJ SOLN
4.0000 mg | Freq: Once | INTRAMUSCULAR | Status: AC
  Administered 2017-01-05: 4 mg via INTRAVENOUS
  Filled 2017-01-05: qty 2

## 2017-01-05 MED ORDER — POTASSIUM CHLORIDE CRYS ER 20 MEQ PO TBCR
40.0000 meq | EXTENDED_RELEASE_TABLET | Freq: Once | ORAL | Status: AC
  Administered 2017-01-05: 40 meq via ORAL
  Filled 2017-01-05: qty 2

## 2017-01-05 MED ORDER — POTASSIUM CHLORIDE CRYS ER 20 MEQ PO TBCR: 40.0000 meq | EXTENDED_RELEASE_TABLET | Freq: Every day | ORAL | 0 refills | Status: AC

## 2017-01-05 MED ORDER — OMEPRAZOLE 20 MG PO CPDR: 20.0000 mg | DELAYED_RELEASE_CAPSULE | Freq: Every day | ORAL | 0 refills | Status: AC

## 2017-01-05 NOTE — ED Provider Notes (Signed)
[facility]-EMERGENCY DEPT Provider Note   CSN: [id number] Arrival date & time: 01/05/17  1427     History   Chief Complaint Chief Complaint  Patient presents with  . Abdominal Pain    HPI [name] is a 83 y.o. female.  HPI   83 yo F with PMHx of severe AS, dementia, HTN, MW tear and GIB here with abdominal pain. History somewhat limited 2/2 dementia/pain, but pt reports 3-4 days of gradual onset, progressively worsening upper abd pain. Pt has had associated nausea, but no vomiting. No diarrhea. According to her facility, pt became hypotensive, SOB today. Pt given breathing tx, sent here for eval. Currently moaning in pain, c/o diffuse abd pain. No known fevers. She does state she has occasional dark black/bloody stools, this has been chronic.  Level 5 caveat invoked as remainder of history, ROS, and physical exam limited due to patient's dementia.   Past Medical History:  Diagnosis Date  . Abnormality of gait 04/18/2015  . Anemia   . Aortic stenosis, severe 10/11/2012  . Asthma, chronic 10/11/2012  . Blood transfusion   . Cataract 04/18/2015  . COPD (chronic obstructive pulmonary disease) (HCC)   . Dementia with behavioral disturbance   . Diastolic dysfunction 10/11/2012  . Esophageal reflux   . Essential hypertension, benign   . Generalized and unspecified atherosclerosis 03/30/2011  . Generalized weakness 04/18/2011  . HIATAL HERNIA 09/05/2006   Qualifier: Diagnosis of  By: Glyn Ade CMA (AAMA), June    . Hypopotassemia   . Hypothyroidism 04/18/2015  . Insomnia 10/13/2012  . Lung nodule 10/12/2012  . 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 Other and unspecified hyperlipidemia 10/11/2012  . Personal history of alcoholism (HCC)   . Unspecified constipation 10/11/2012  . Urine incontinence 04/18/2015    Patient Active Problem List   Diagnosis Date Noted  . Preop  cardiovascular exam 05/30/2015  . Abnormality of gait 04/18/2015  . Cataract 04/18/2015  . Hypothyroidism 04/18/2015  . Urine incontinence 04/18/2015  . Insomnia 10/13/2012  . Lung nodule 10/12/2012  . Alzheimer's dementia 10/11/2012  . Unspecified constipation 10/11/2012  . Aortic stenosis, severe 10/11/2012  . Diastolic dysfunction 10/11/2012  . Asthma, chronic 10/11/2012  . Other and unspecified hyperlipidemia 10/11/2012  . Generalized weakness 04/18/2011  . Generalized and unspecified atherosclerosis 03/30/2011  . HTN (hypertension) 03/07/2011  . 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 GERD 09/05/2006  . HIATAL HERNIA 09/05/2006    Past Surgical History:  Procedure Laterality Date  . COLONOSCOPY  03/08/2011   Procedure: COLONOSCOPY;  Surgeon: Erick Blinks, MD;  Location: [facility] ENDOSCOPY;  Service: Gastroenterology;  Laterality: N/A;  . NO PAST SURGERIES      OB History    No data available       Home Medications    Prior to Admission medications   Medication Sig Start Date End Date Taking? Authorizing Provider  albuterol (PROVENTIL) (2.5 MG/3ML) 0.083% nebulizer solution Take 2.5 mg by nebulization every 4 (four) hours as needed for wheezing or shortness of breath.   Yes [provider]  divalproex (DEPAKOTE SPRINKLE) 125 MG capsule Take 250 mg by mouth at bedtime.   Yes [provider]  donepezil (ARICEPT) 10 MG tablet Take 10 mg by mouth at bedtime.   Yes [provider]  Fluticasone-Salmeterol (ADVAIR) 500-50 MCG/DOSE AEPB Inhale 1 puff into the lungs 2 (two) times daily.   Yes  [provider]  ipratropium-albuterol (DUONEB) 0.5-2.5 (3) MG/3ML SOLN Take 3 mLs by nebulization every 6 (six) hours as needed (sob and wheezing).   Yes [provider]  levothyroxine (SYNTHROID, LEVOTHROID) 125 MCG tablet Take 125 mcg by mouth daily before breakfast.   Yes [provider]  magnesium hydroxide (MILK OF MAGNESIA) 400 MG/5ML  suspension Take 30 mLs by mouth daily.    Yes [provider]  memantine (NAMENDA) 10 MG tablet Take 10 mg by mouth 2 (two) times daily.   Yes [provider]  metoprolol tartrate (LOPRESSOR) 25 MG tablet Take 25 mg by mouth 2 (two) times daily.   Yes [provider]  Polyethyl Glycol-Propyl Glycol (SYSTANE) 0.4-0.3 % SOLN Place 1 drop into both eyes 3 (three) times daily.   Yes [provider]  polyethylene glycol (MIRALAX / GLYCOLAX) packet Take 17 g by mouth daily.   Yes [provider]  ranitidine (ZANTAC) 150 MG tablet Take 150 mg by mouth daily.   Yes [provider]  rosuvastatin (CRESTOR) 5 MG tablet Take 10 mg by mouth at bedtime.    Yes [provider]  senna-docusate (SENOKOT-S) 8.6-50 MG per tablet Take 1 tablet by mouth 2 (two) times daily.   Yes [provider]  solifenacin (VESICARE) 5 MG tablet Take 10 mg by mouth daily.   Yes [provider]  Venlafaxine HCl (EFFEXOR PO) Take 112.5 mg by mouth daily.   Yes [provider]  Vitamin D, Ergocalciferol, (DRISDOL) 50000 units CAPS capsule Take 50,000 Units by mouth every 30 (thirty) days. On the 15th   Yes [provider]  acetaminophen (TYLENOL) 325 MG tablet Take 650 mg by mouth every 8 (eight) hours as needed for pain.    [provider]  omeprazole (PRILOSEC) 20 MG capsule Take 1 capsule (20 mg total) by mouth daily. 01/05/17 01/15/17  Shaune Pollack, MD  polyethylene glycol (MIRALAX / Ethelene Hal) packet Take 17 g by mouth daily. Patient not taking: Reported on 01/05/2017 10/16/12   Christiane Ha, MD  potassium chloride SA (K-DUR,KLOR-CON) 20 MEQ tablet Take 2 tablets (40 mEq total) by mouth daily. 01/05/17 01/08/17  Shaune Pollack, MD  risperiDONE (RISPERDAL) 0.25 MG tablet Take 0.5 tablets (0.125 mg total) by mouth at bedtime. Patient not taking: Reported on 01/05/2017 10/16/12   Christiane Ha, MD    Family  History Family History  Problem Relation Age of Onset  . CAD Mother     Social History Social History  Substance Use Topics  . Smoking status: Former Smoker    Packs/day: 1.00    Types: Cigarettes  . Smokeless tobacco: Never Used  . Alcohol use No     Comment: former      Allergies   Penicillins   Review of Systems Review of Systems  Constitutional: Positive for fatigue.  Respiratory: Positive for cough.   Gastrointestinal: Positive for abdominal pain, diarrhea and nausea.  Neurological: Positive for weakness.  All other systems reviewed and are negative.    Physical Exam Updated Vital Signs BP (!) 109/56 (BP Location: Right Arm)   Pulse 64   Temp 99.1 F (37.3 C) (Rectal)   Resp 18   Ht 5' 5.5" (1.664 m)   Wt 86.2 kg (190 lb)   SpO2 95%   BMI 31.14 kg/m   Physical Exam  Constitutional: She is oriented to person, place, and time. She appears well-developed and well-nourished. No distress.  HENT:  Head: Normocephalic and  atraumatic.  Eyes: Conjunctivae are normal.  Neck: Neck supple.  Cardiovascular: Normal rate, regular rhythm and normal heart sounds.  Exam reveals no friction rub.   No murmur heard. Pulmonary/Chest: Effort normal and breath sounds normal. No respiratory distress. She has no wheezes. She has no rales.  Abdominal: Normal appearance and bowel sounds are normal. She exhibits no distension. There is generalized tenderness and tenderness in the epigastric area, periumbilical area and left upper quadrant. There is no rigidity, no rebound and no guarding.  Musculoskeletal: She exhibits no edema.  Neurological: She is alert and oriented to person, place, and time. She exhibits normal muscle tone.  Skin: Skin is warm. Capillary refill takes less than 2 seconds.  Psychiatric: She has a normal mood and affect.  Nursing note and vitals reviewed.    ED Treatments / Results  Labs (all labs ordered are listed, but only abnormal results are  displayed) Labs Reviewed  COMPREHENSIVE METABOLIC PANEL - Abnormal; Notable for the following:       Result Value   Potassium 3.2 (*)    Albumin 3.1 (*)    ALT 6 (*)    All other components within normal limits  CBC - Abnormal; Notable for the following:    RBC 6.65 (*)    Hemoglobin 16.6 (*)    HCT 51.2 (*)    MCV 77.0 (*)    MCH 25.0 (*)    RDW 19.0 (*)    All other components within normal limits  URINALYSIS, ROUTINE W REFLEX MICROSCOPIC - Abnormal; Notable for the following:    Specific Gravity, Urine >1.046 (*)    Ketones, ur 20 (*)    All other components within normal limits  I-STAT CHEM 8, ED - Abnormal; Notable for the following:    Potassium 3.2 (*)    Chloride 100 (*)    Hemoglobin 18.4 (*)    HCT 54.0 (*)    All other components within normal limits  LIPASE, BLOOD  I-STAT CG4 LACTIC ACID, ED  POC OCCULT BLOOD, ED  I-STAT CG4 LACTIC ACID, ED  I-STAT TROPONIN, ED  I-STAT TROPONIN, ED  TYPE AND SCREEN  ABO/RH    EKG  EKG Interpretation  Date/Time:  Wednesday January 05 2017 16:50:48 EDT Ventricular Rate:  72 PR Interval:    QRS Duration: 93 QT Interval:  429 QTC Calculation: 470 R Axis:   -53 Text Interpretation:  Sinus rhythm Left anterior fascicular block Nonspecific T abnormalities, lateral leads No significant change since last tracing Confirmed by Shaune Pollack [phone]) on 01/06/2017 11:28:26 AM       Radiology Ct Abdomen Pelvis W Contrast  Result Date: 01/05/2017 CLINICAL DATA:  83 year old female with abdominal distention and upper abdominal pain. Concern for GI bleed. EXAM: CT ABDOMEN AND PELVIS WITH CONTRAST TECHNIQUE: Multidetector CT imaging of the abdomen and pelvis was performed using the standard protocol following bolus administration of intravenous contrast. CONTRAST:  ISOVUE-300 IOPAMIDOL (ISOVUE-300) INJECTION 61% COMPARISON:  Abdominal radiograph dated 01/05/2017 and CT of the abdomen pelvis dated 03/06/2011 FINDINGS: Lower  chest: Bibasilar linear atelectasis/ scarring. There is calcification of the mitral annulus. There is no intra-abdominal free air or free fluid. Hepatobiliary: No focal liver abnormality is seen. No gallstones, gallbladder wall thickening, or biliary dilatation. Pancreas: Unremarkable. No pancreatic ductal dilatation or surrounding inflammatory changes. Spleen: Normal in size without focal abnormality. Adrenals/Urinary Tract: The adrenal glands are unremarkable. Bilateral renal cysts noted. Subcentimeter renal hypodense lesions are too small to characterize. There is  no hydronephrosis on either side. There is symmetric enhancement and excretion of contrast by kidneys. The visualized ureters appear unremarkable. There is apparent diffuse thickening of the bladder wall which may be partly related to underdistention. Cystitis is not excluded. Correlation with urinalysis recommended. Stomach/Bowel: There is extensive colonic diverticulosis without active inflammatory changes. No evidence of bowel obstruction or active inflammation. The appendix is normal. Vascular/Lymphatic: There is advanced aortoiliac atherosclerotic disease. There is aneurysmal dilatation of the aorta at the level of the diaphragm measuring up to 4.3 cm in greatest axial diameter. There is noncalcified mural thrombus along the periphery of the lumen of this aneurysm with approximately 50% luminal narrowing (Series 2, image 21). The origins of the celiac axis, SMA, IMA and the renal arteries are patent. There is ectasia of infrarenal abdominal aorta. The IVC is grossly unremarkable. No portal venous gas noted. There is no adenopathy. Reproductive: The uterus is grossly unremarkable but not well evaluated on CT. A small uterine fundal fibroid is seen. There is thickened appearance of the endometrium by CT measuring up to 7 mm. However, CT is limited in evaluation of the endometrium and the uterus. Further evaluation with ultrasound recommended. The  ovaries are grossly unremarkable. No pelvic mass. Other: None Musculoskeletal: Osteopenia with degenerative changes of the spine and hips. No acute osseous pathology. IMPRESSION: 1. No acute intra-abdominal or pelvic pathology. Extensive colonic diverticulosis. No bowel obstruction or active inflammation. No definite evidence of active hemorrhage by CT. A tagged red blood cell nuclear medicine study may provide better evaluation if there is high clinical concern for active GI bleed. No intraperitoneal or retroperitoneal hemorrhage. 2. Advanced Aortic Atherosclerosis (ICD10-I70.0). There is a 4.3 cm aneurysm of the aorta at the level of the diaphragm with partially occlusive mural thrombus, progressed compared to the CT of 03/06/2011. Recommend followup by ultrasound in 1 year. This recommendation follows ACR consensus guidelines: White Paper of the ACR Incidental Findings Committee II on Vascular Findings. J Am Coll Radiol 2013; 10:789-794. 3. Thickened appearance of the endometrium. The uterus is poorly evaluated on CT. Further evaluation with ultrasound recommended. Electronically Signed   By: Elgie Collard M.D.   On: 01/05/2017 18:49   Dg Abd Acute W/chest  Result Date: 01/05/2017 CLINICAL DATA:  Acute abdominal pain EXAM: DG ABDOMEN ACUTE W/ 1V CHEST COMPARISON:  01/24/2013 FINDINGS: Cardiac shadow is within normal limits. The lungs are well aerated bilaterally. Some mild left basilar atelectasis is seen. Scattered large and small bowel gas is noted without obstructive change. No free air is noted. No abnormal mass or abnormal calcifications are noted. Degenerative changes of the lumbar spine are seen. IMPRESSION: Mild left basilar atelectasis.  No other focal abnormality is seen. Electronically Signed   By: Alcide Clever M.D.   On: 01/05/2017 16:28    Procedures Procedures (including critical care time)  Emergency Ultrasound Study:   Angiocath insertion Performed by: Dollene Cleveland Consent:  Verbal consent/emergent consent obtained. Risks and benefits: risks, benefits and alternatives were discussed Immediately prior to procedure the correct patient, procedure, equipment, support staff and site/side marked as needed.  Indication: difficult IV access Preparation: Patient was prepped and draped in the usual sterile fashion. Sterile gel was used for this procedure and the ultrasound probe was sterilized prior to use. Vein Location: Left forearm vein was visualized during assessment for potential access sites and was found to be patent/ easily compressed with linear ultrasound.  The needle was visualized with real-time ultrasound and guided into the vein.  Gauge: 20  Image saved and stored.  Normal blood return.   Patient tolerance: Patient tolerated the procedure well with no immediate complications.       Medications Ordered in ED Medications  sodium chloride 0.9 % bolus 1,000 mL (0 mLs Intravenous Stopped 01/05/17 1711)  ondansetron (ZOFRAN) injection 4 mg (4 mg Intravenous Given 01/05/17 1624)  sodium chloride 0.9 % bolus 1,000 mL (0 mLs Intravenous Stopped 01/05/17 1910)  iopamidol (ISOVUE-300) 61 % injection 100 mL (100 mLs Intravenous Contrast Given 01/05/17 1754)  potassium chloride SA (K-DUR,KLOR-CON) CR tablet 40 mEq (40 mEq Oral Given 01/05/17 2055)     Initial Impression / Assessment and Plan / ED Course  I have reviewed the triage vital signs and the nursing notes.  Pertinent labs & imaging results that were available during my care of the patient were reviewed by me and considered in my medical decision making (see chart for details).    83 yo F with PMHx as above here with generalized abd pain, poor PO intake. Pt dehydrated on exam, abdomen soft. Initial DDx broad, must consider gastritis, GERD, PUD, obstruction, diverticulitis, UTI, less likely AAA. EKG non-ischemic and trop neg, doubt referred pain from cardiac source. Must also consider worsening dementia  with subsequent poor PO intake. Will start fluids, check labs/imaging.  Labs, imaging as above. Pt dehydrated on UA and hemoconcentrated but no signs of UTI, renal dysfunction, liver injury, or pancreatitis. Trop neg, EKG non-ischemic as mentioned. CT A/P is negative. She is hemoccult neg. She feels improved with fluids, is tolerating PO without difficulty.   While etiology of her reported abd pain unclear, no apparent emergent pathology. It has resolved with fluids and PO food. I do suspect pt is dehydrated and reportedly is not encouraged to eat at her facility - may be a component of dehydration, hunger, possibly gastritis. Given reassuring vitals, serial exams, and normal labs, will d/c with encouraged PO intake, outpt follow-up. Daughter updated and in agreement.   Final Clinical Impressions(s) / ED Diagnoses   Final diagnoses:  Abdominal pain  Dehydration  Hypokalemia    New Prescriptions Discharge Medication List as of 01/05/2017  9:05 PM    START taking these medications   Details  potassium chloride SA (K-DUR,KLOR-CON) 20 MEQ tablet Take 2 tablets (40 mEq total) by mouth daily., Starting Wed 01/05/2017, Until Sat 01/08/2017, Print         Shaune PollackIsaacs, Earlee Herald, MD 01/06/17 1130

## 2017-01-05 NOTE — ED Notes (Signed)
Bed: WA24 Expected date:  Expected time:  Means of arrival:  Comments: EMS/abd. pain 

## 2017-01-05 NOTE — ED Notes (Signed)
[facility] was called for pt's transportation back to [facility]. 

## 2017-01-05 NOTE — ED Notes (Signed)
[facility] here to transport pt back to [facility]. 

## 2017-01-05 NOTE — ED Notes (Signed)
[facility] to [facility] for report.

## 2017-01-05 NOTE — Discharge Instructions (Signed)
Your lab work today showed significant dehydration and low potassium. There were no signs of urine infection, pneumonia, or other infection at this time.  It is very important to drink at least 6-8 glasses of water per day.  Take the potassium supplements as well as something to help with possible gastritis.  Follow-up with repeat labs in 3-5 days for further check-up

## 2017-01-05 NOTE — ED Triage Notes (Signed)
MD Provider at living facility called [facility], stated that pt has acute abdominal pain. MD Provider sated that her vitals dropped and patient was wheezing. Pt received Duo Neb on EMS. Pt has left upper abdomen pain. MD Provider at living facility wants to rule out GI Bleed and PE. Pt does have history of GI Bleed and constipation.

## 2017-01-05 NOTE — ED Notes (Signed)
Pt tolerated fluid/PO challenge well---- able to drink 2 cups of water and consumed graham crackers with peanut butter.

## 2017-05-19 ENCOUNTER — Other Ambulatory Visit: Payer: Self-pay

## 2017-05-19 ENCOUNTER — Emergency Department (HOSPITAL_COMMUNITY)
Admission: EM | Admit: 2017-05-19 | Discharge: 2017-05-19 | Disposition: A | Discharge status: Home / Self Care | Payer: Medicare Other | Attending: Emergency Medicine | Admitting: Emergency Medicine

## 2017-05-19 DIAGNOSIS — Z79899 Other long term (current) drug therapy: Secondary | ICD-10-CM | POA: Diagnosis not present

## 2017-05-19 DIAGNOSIS — E039 Hypothyroidism, unspecified: Secondary | ICD-10-CM | POA: Diagnosis not present

## 2017-05-19 DIAGNOSIS — F0281 Dementia in other diseases classified elsewhere with behavioral disturbance: Secondary | ICD-10-CM | POA: Insufficient documentation

## 2017-05-19 DIAGNOSIS — I10 Essential (primary) hypertension: Secondary | ICD-10-CM | POA: Insufficient documentation

## 2017-05-19 DIAGNOSIS — J449 Chronic obstructive pulmonary disease, unspecified: Secondary | ICD-10-CM | POA: Insufficient documentation

## 2017-05-19 DIAGNOSIS — K922 Gastrointestinal hemorrhage, unspecified: Secondary | ICD-10-CM | POA: Diagnosis present

## 2017-05-19 DIAGNOSIS — G309 Alzheimer's disease, unspecified: Secondary | ICD-10-CM | POA: Insufficient documentation

## 2017-05-19 NOTE — ED Notes (Signed)
[facility] for [facility] per social worker [facility]. MD notified by Child psychotherapistsocial worker.

## 2017-05-19 NOTE — ED Notes (Signed)
Family member in room with patient.

## 2017-05-19 NOTE — ED Notes (Signed)
[facility]

## 2017-05-19 NOTE — ED Notes (Signed)
This NT and Jill AlexandersJustin, RN cleaned pt up. Bedpad changed as well as gown. Pt given blankets and ginger ale. Pt observed to be very content. Daughter at bedside.

## 2017-05-19 NOTE — ED Notes (Signed)
[name] DuhamelLaurel [name], daughter. Please call when [facility] is [facility]. [phone] cell, [phone] home.

## 2017-05-19 NOTE — ED Provider Notes (Signed)
[facility]   CSN: [id number] Arrival date & time: 05/19/17  1512     History   Chief Complaint Chief Complaint  Patient presents with  . GI Bleeding    HPI [name] is a 84 y.o. female.  The history is provided by the patient. No language interpreter was used.   [name] is a 84 y.o. female who presents to the Emergency Department complaining of GI bleed. Level V caveat dementia.  She presents from [facility].  Per reports she is experienced a GI bleed starting today.  A week ago she had problems with constipation and received a fleets enema.  Following the enema she had multiple successful and large bowel movements.  They did note that the stool smelled strongly.  Today early this morning she developed severe rectal bleeding.  No vomiting.  She is currently on hospice for severe dementia. Past Medical History:  Diagnosis Date  . Abnormality of gait 04/18/2015  . Anemia   . Aortic stenosis, severe 10/11/2012  . Asthma, chronic 10/11/2012  . Blood transfusion   . Cataract 04/18/2015  . COPD (chronic obstructive pulmonary disease) (HCC)   . Dementia with behavioral disturbance   . Diastolic dysfunction 10/11/2012  . Esophageal reflux   . Essential hypertension, benign   . Generalized and unspecified atherosclerosis 03/30/2011  . Generalized weakness 04/18/2011  . HIATAL HERNIA 09/05/2006   Qualifier: Diagnosis of  By: Glyn AdeMcMurray CMA (AAMA), June    . Hypopotassemia   . Hypothyroidism 04/18/2015  . Insomnia 10/13/2012  . Lung nodule 10/12/2012  . 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 Other and unspecified hyperlipidemia 10/11/2012  . Personal history of alcoholism (HCC)   . Unspecified constipation 10/11/2012  . Urine incontinence 04/18/2015    Patient Active Problem List   Diagnosis Date Noted  . Preop  cardiovascular exam 05/30/2015  . Abnormality of gait 04/18/2015  . Cataract 04/18/2015  . Hypothyroidism 04/18/2015  . Urine incontinence 04/18/2015  . Insomnia 10/13/2012  . Lung nodule 10/12/2012  . Alzheimer's dementia 10/11/2012  . Unspecified constipation 10/11/2012  . Aortic stenosis, severe 10/11/2012  . Diastolic dysfunction 10/11/2012  . Asthma, chronic 10/11/2012  . Other and unspecified hyperlipidemia 10/11/2012  . Generalized weakness 04/18/2011  . Generalized and unspecified atherosclerosis 03/30/2011  . HTN (hypertension) 03/07/2011  . 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 GERD 09/05/2006  . HIATAL HERNIA 09/05/2006    Past Surgical History:  Procedure Laterality Date  . COLONOSCOPY  03/08/2011   Procedure: COLONOSCOPY;  Surgeon: Erick BlinksJay Pyrtle, MD;  [facility];  Service: Gastroenterology;  Laterality: N/A;  . NO PAST SURGERIES      OB History    No data available       Home Medications    Prior to Admission medications   Medication Sig Start Date End Date Taking? Authorizing Provider  acetaminophen (TYLENOL) 325 MG tablet Take 650 mg by mouth every 8 (eight) hours as needed for pain.    [provider]  albuterol (PROVENTIL) (2.5 MG/3ML) 0.083% nebulizer solution Take 2.5 mg by nebulization every 4 (four) hours as needed for wheezing or shortness of breath.    [provider]  divalproex (DEPAKOTE SPRINKLE) 125 MG capsule Take 250 mg by mouth at bedtime.    [provider]  donepezil (ARICEPT) 10 MG tablet Take 10 mg  by mouth at bedtime.    [provider]  Fluticasone-Salmeterol (ADVAIR) 500-50 MCG/DOSE AEPB Inhale 1 puff into the lungs 2 (two) times daily.    [provider]  ipratropium-albuterol (DUONEB) 0.5-2.5 (3) MG/3ML SOLN Take 3 mLs by nebulization every 6 (six) hours as needed (sob and wheezing).    [provider]  levothyroxine (SYNTHROID, LEVOTHROID) 125 MCG tablet Take 125 mcg by  mouth daily before breakfast.    [provider]  magnesium hydroxide (MILK OF MAGNESIA) 400 MG/5ML suspension Take 30 mLs by mouth daily.     [provider]  memantine (NAMENDA) 10 MG tablet Take 10 mg by mouth 2 (two) times daily.    [provider]  metoprolol tartrate (LOPRESSOR) 25 MG tablet Take 25 mg by mouth 2 (two) times daily.    [provider]  omeprazole (PRILOSEC) 20 MG capsule Take 1 capsule (20 mg total) by mouth daily. 01/05/17 01/15/17  Shaune Pollack, MD  Polyethyl Glycol-Propyl Glycol (SYSTANE) 0.4-0.3 % SOLN Place 1 drop into both eyes 3 (three) times daily.    [provider]  polyethylene glycol (MIRALAX / GLYCOLAX) packet Take 17 g by mouth daily. Patient not taking: Reported on 01/05/2017 10/16/12   Christiane Ha, MD  polyethylene glycol [facility] / Ethelene Hal) packet Take 17 g by mouth daily.    [provider]  potassium chloride SA (K-DUR,KLOR-CON) 20 MEQ tablet Take 2 tablets (40 mEq total) by mouth daily. 01/05/17 01/08/17  Shaune Pollack, MD  ranitidine (ZANTAC) 150 MG tablet Take 150 mg by mouth daily.    [provider]  risperiDONE (RISPERDAL) 0.25 MG tablet Take 0.5 tablets (0.125 mg total) by mouth at bedtime. Patient not taking: Reported on 01/05/2017 10/16/12   Christiane Ha, MD  rosuvastatin (CRESTOR) 5 MG tablet Take 10 mg by mouth at bedtime.     [provider]  senna-docusate (SENOKOT-S) 8.6-50 MG per tablet Take 1 tablet by mouth 2 (two) times daily.    [provider]  solifenacin (VESICARE) 5 MG tablet Take 10 mg by mouth daily.    [provider]  Venlafaxine HCl (EFFEXOR PO) Take 112.5 mg by mouth daily.    [provider]  Vitamin D, Ergocalciferol, (DRISDOL) 50000 units CAPS capsule Take 50,000 Units by mouth every 30 (thirty) days. On the 15th    [provider]    Family History Family History  Problem Relation Age of Onset  .  CAD Mother     Social History Social History   Tobacco Use  . Smoking status: Former Smoker    Packs/day: 1.00    Types: Cigarettes  . Smokeless tobacco: Never Used  Substance Use Topics  . Alcohol use: No    Comment: former   . Drug use: No     Allergies   Penicillins   Review of Systems Review of Systems  All other systems reviewed and are negative.    Physical Exam Updated Vital Signs BP 94/62   Pulse 61   Temp 98 F (36.7 C) (Oral)   Resp 15   Ht 5\' 5"  (1.651 m)   Wt 65.8 kg (145 lb)   SpO2 93%   BMI 24.13 kg/m   Physical Exam  Constitutional: She appears well-developed and well-nourished.  HENT:  Head: Normocephalic and atraumatic.  Cardiovascular: Normal rate and regular rhythm.  No murmur heard. Pulmonary/Chest: Effort normal and breath sounds normal. No respiratory distress.  Abdominal: Soft.  Mild generalized  abdominal tenderness  Genitourinary:  Genitourinary Comments: Large amount of dark blood covering undergarments and back.  Musculoskeletal: She exhibits no edema or tenderness.  Neurological:  Drowsy but arousable to verbal stimuli.  Profound generalized weakness.  Confused and disoriented to place and time.  Skin: Skin is warm and dry. Capillary refill takes less than 2 seconds.  Psychiatric: She has a normal mood and affect. Her behavior is normal.  Nursing note and vitals reviewed.    ED Treatments / Results  Labs (all labs ordered are listed, but only abnormal results are displayed) Labs Reviewed - No data to display  EKG  EKG Interpretation None       Radiology No results found.  Procedures Procedures (including critical care time)  Medications Ordered in ED Medications - No data to display   Initial Impression / Assessment and Plan / ED Course  I have reviewed the triage vital signs and the nursing notes.  Pertinent labs & imaging results that were available during my care of the patient were reviewed by me and  considered in my medical decision making (see chart for details).     Patient with history of advanced dementia on hospice here for evaluation of bright red blood per rectum beginning today.  On examination she has massive hematochezia.  She is drowsy but conversant and in good spirits.  [facility] at [facility] bedside in the emergency department.  Discussed with the patient's daughter and power of attorney.  Given patient's medical comorbidities as well as advanced dementia daughter prefers comfort measures and declines lab draws, IV sticks and blood transfusions at this time.  Patient does appear to be resting comfortably.  Plan to transfer to [facility] place for further comfort measures.  Final Clinical Impressions(s) / ED Diagnoses   Final diagnoses:  Acute GI bleeding    ED Discharge Orders    None       Tilden Fossa, MD 05/19/17 2352

## 2017-05-19 NOTE — ED Notes (Signed)
Red blood noted in area of rectum.

## 2017-05-19 NOTE — ED Triage Notes (Signed)
Patient from nursing home and per EMS states that bleeding has been going on for 3 days. Patient in hospice care and DNR and per EMS hospice nurse sent patient here for treatment.

## 2017-11-06 DEATH — deceased
